# Patient Record
Sex: Male | Born: 1935
Health system: Southern US, Community
[De-identification: ages and names within clinical notes are randomized; demographics above are authoritative.]

## PROBLEM LIST (undated history)

## (undated) DIAGNOSIS — G20 Parkinson's disease: Secondary | ICD-10-CM

## (undated) DIAGNOSIS — N200 Calculus of kidney: Secondary | ICD-10-CM

## (undated) DIAGNOSIS — C439 Malignant melanoma of skin, unspecified: Secondary | ICD-10-CM

## (undated) DIAGNOSIS — J189 Pneumonia, unspecified organism: Secondary | ICD-10-CM

## (undated) DIAGNOSIS — R42 Dizziness and giddiness: Secondary | ICD-10-CM

## (undated) DIAGNOSIS — I10 Essential (primary) hypertension: Secondary | ICD-10-CM

## (undated) DIAGNOSIS — R55 Syncope and collapse: Secondary | ICD-10-CM

## (undated) DIAGNOSIS — A6920 Lyme disease, unspecified: Secondary | ICD-10-CM

## (undated) DIAGNOSIS — I469 Cardiac arrest, cause unspecified: Secondary | ICD-10-CM

## (undated) HISTORY — DX: Essential (primary) hypertension: I10

## (undated) HISTORY — DX: Malignant melanoma of skin, unspecified: C43.9

## (undated) HISTORY — PX: TEMPORAL ARTERY BIOPSY / LIGATION: SUR132

## (undated) HISTORY — PX: CYSTOSCOPY W/ STONE MANIPULATION: SHX1427

---

## 1968-07-23 HISTORY — PX: CATARACT EXTRACTION W/ INTRAOCULAR LENS  IMPLANT, BILATERAL: SHX1307

## 1969-03-23 DIAGNOSIS — I469 Cardiac arrest, cause unspecified: Secondary | ICD-10-CM

## 1969-03-23 HISTORY — DX: Cardiac arrest, cause unspecified: I46.9

## 2008-07-23 DIAGNOSIS — C439 Malignant melanoma of skin, unspecified: Secondary | ICD-10-CM

## 2008-07-23 HISTORY — PX: MELANOMA EXCISION: SHX5266

## 2008-07-23 HISTORY — DX: Malignant melanoma of skin, unspecified: C43.9

## 2012-11-21 ENCOUNTER — Ambulatory Visit: Payer: Medicare HMO | Attending: Internal Medicine

## 2012-11-21 DIAGNOSIS — G20A1 Parkinson's disease without dyskinesia, without mention of fluctuations: Secondary | ICD-10-CM | POA: Insufficient documentation

## 2012-11-21 DIAGNOSIS — G20 Parkinson's disease: Secondary | ICD-10-CM | POA: Insufficient documentation

## 2012-11-21 DIAGNOSIS — R471 Dysarthria and anarthria: Secondary | ICD-10-CM | POA: Insufficient documentation

## 2012-11-21 DIAGNOSIS — IMO0001 Reserved for inherently not codable concepts without codable children: Secondary | ICD-10-CM | POA: Insufficient documentation

## 2012-11-21 DIAGNOSIS — R499 Unspecified voice and resonance disorder: Secondary | ICD-10-CM | POA: Insufficient documentation

## 2013-07-23 HISTORY — PX: BLADDER STONE REMOVAL: SHX568

## 2013-07-23 HISTORY — PX: CIRCUMCISION: SUR203

## 2013-11-20 ENCOUNTER — Ambulatory Visit (INDEPENDENT_AMBULATORY_CARE_PROVIDER_SITE_OTHER): Payer: Medicare HMO | Admitting: Diagnostic Neuroimaging

## 2013-11-20 ENCOUNTER — Encounter: Payer: Self-pay | Admitting: Diagnostic Neuroimaging

## 2013-11-20 ENCOUNTER — Encounter (INDEPENDENT_AMBULATORY_CARE_PROVIDER_SITE_OTHER): Payer: Self-pay

## 2013-11-20 VITALS — BP 90/53 | HR 53 | Temp 96.6°F | Ht 71.0 in | Wt 186.0 lb

## 2013-11-20 DIAGNOSIS — G20 Parkinson's disease: Secondary | ICD-10-CM | POA: Insufficient documentation

## 2013-11-20 DIAGNOSIS — R1310 Dysphagia, unspecified: Secondary | ICD-10-CM

## 2013-11-20 DIAGNOSIS — G252 Other specified forms of tremor: Secondary | ICD-10-CM

## 2013-11-20 DIAGNOSIS — G250 Essential tremor: Secondary | ICD-10-CM

## 2013-11-20 DIAGNOSIS — R490 Dysphonia: Secondary | ICD-10-CM

## 2013-11-20 DIAGNOSIS — R269 Unspecified abnormalities of gait and mobility: Secondary | ICD-10-CM | POA: Insufficient documentation

## 2013-11-20 MED ORDER — CARBIDOPA-LEVODOPA 25-100 MG PO TABS: 2.0000 | ORAL_TABLET | Freq: Three times a day (TID) | ORAL | Status: DC

## 2013-11-20 NOTE — Patient Instructions (Signed)
Go back to original carbidopa/levodopa dosing (2 tabs three times per day) --> 7am, 1pm, 9pm.

## 2013-11-20 NOTE — Progress Notes (Signed)
[facility]  PATIENT: Gabriel Stewart DOB: [birth year]  REFERRING CLINICIAN: Edwin Dada HISTORY FROM: patient and wife REASON FOR VISIT: new consult   HISTORICAL  CHIEF COMPLAINT:  Chief Complaint  Patient presents with  . New Evaluation    rm 7,[place],parkinson's disease, paper referral    HISTORY OF PRESENT ILLNESS:   80 year old ambidextrous male with hypertension, melanoma skin cancer, here for evaluation of Parkinson's disease.  Patient has had essential tremor since 1970s, 1980s, with family history of essential tremor in his own father. Patient's essential tremor was mainly postural tremor, mild action tremor, but did not affect him throughout his life significantly. He was never on medication for essential tremor.  2008 patient developed a new kind of tremor in his left upper extremity, at rest and with action. Handwriting deteriorated. Patient was evaluated and diagnosed with Parkinson's disease. 2 started on carbidopa/levodopa with improvement in tremor control. She's been managed at [facility] for the past year after moving to [place]. Now wants to establish with local neurologist. Patient has been on carbidopa/levodopa 25/100, 2 tablets 3 times per day. Patient followed up with primary care physician, and possibly due to miscommunication, started taking 2 tablets 4 times a day several days ago. No benefit or adverse effects. Patient was doing quite well on 2 tablets 3 times a day. No on a fluctuations. No wearing off.  Patient having some balance difficulty shuffling gait. No falls recently. She's having difficulty swallowing. He has some saliva control issues. Hoarse and soft voice is notable. He has decreased sense of smell and taste. No REM behavior sleep disorder symptoms. He has mild to moderate constipation.  REVIEW OF SYSTEMS: Full 14 system review of systems performed and notable only for memory loss slurred speech difficulty  swallowing tremor decreased energy running nose constipation trouble swallowing melanoma.  ALLERGIES: Allergies  Allergen Reactions  . Novocain [Procaine]     syncope    HOME MEDICATIONS: No outpatient prescriptions prior to visit.   No facility-administered medications prior to visit.    PAST MEDICAL HISTORY: Past Medical History  Diagnosis Date  . Hypertension   . Melanoma 2010    skin    PAST SURGICAL HISTORY: Past Surgical History  Procedure Laterality Date  . Cataract extraction Bilateral 1970  . Circumcision  2015  . Bladder stone removal  2015    FAMILY HISTORY: Family History  Problem Relation Age of Onset  . Cancer - Prostate Sister   . Appendicitis Brother     SOCIAL HISTORY:  History   Social History  . Marital Status: Married    Spouse Name: [name]    Number of Children: 3  . Years of Education: 16   Occupational History  .      retired   Social History Main Topics  . Smoking status: Former Research scientist (life sciences)  . Smokeless tobacco: Never Used     Comment: quit in 1960  . Alcohol Use: Yes     Comment: 2 shots daily  . Drug Use: No  . Sexual Activity: Not on file   Other Topics Concern  . Not on file   Social History Narrative   Patient resides with wife, can write with hands     PHYSICAL EXAM  Filed Vitals:   11/20/13 1046  BP: 90/53  Pulse: 53  Temp: 96.6 F (35.9 C)  TempSrc: Oral  Height: 5\' 11"  (1.803 m)  Weight: 186 lb (84.369 kg)    Not recorded  Body mass index is 25.95 kg/(m^2).  GENERAL EXAM: Patient is in no distress; well developed, nourished and groomed; neck is supple; MULTIPLE STIGMATA OF SKIN CANCERS ON FACE AND SCALP  CARDIOVASCULAR: Regular rate and rhythm, no murmurs, no carotid bruits  NEUROLOGIC: MENTAL STATUS: awake, alert, oriented to person, place and time, recent and remote memory intact, normal attention and concentration, language fluent, comprehension intact, naming intact, fund of knowledge  appropriate CRANIAL NERVE: no papilledema on fundoscopic exam, pupils equal and reactive to light, visual fields full to confrontation, extraocular muscles intact, no nystagmus, facial sensation and strength symmetric, hearing intact, palate elevates symmetrically, uvula midline, shoulder shrug symmetric, tongue midline. HOARSE VOICE. SOFT VOICE. MOTOR: normal bulk and tone, MINIMAL POSTURAL TREMOR. NO RIGIDITY. MILD BRADYKINESIA IN LUE AND LLE. Full strength in the BUE, BLE SENSORY: normal and symmetric to light touch; DECR VIB AT TOES (<5SEC). COORDINATION: finger-nose-finger, fine finger movements normal REFLEXES: BUE TRACE, KNEES TRACE, ANKLES 0 GAIT/STATION: SLOW TO RISE, MILD FREEZING ON GAIT INITIATION, EN BLOC TURNING.     DIAGNOSTIC DATA (LABS, IMAGING, TESTING) - I reviewed patient records, labs, notes, testing and imaging myself where available.  No results found for this basename: WBC, HGB, HCT, MCV, PLT   No results found for this basename: na, k, cl, co2, glucose, bun, creatinine, calcium, prot, albumin, ast, alt, alkphos, bilitot, gfrnonaa, gfraa   No results found for this basename: CHOL, HDL, LDLCALC, LDLDIRECT, TRIG, CHOLHDL   No results found for this basename: HGBA1C   No results found for this basename: VITAMINB12   No results found for this basename: TSH      ASSESSMENT AND PLAN  80 y.o. year old male here with long history of essential tremor, with new-onset Parkinson's disease around 2008. Doing well on carbidopa/levodopa.  Dx: parkinson's disease + essential tremor  PLAN: - resume carb/levo 25/100, 2 tabs TID (inadvertantly has been taking 2 tabs QID due to miscommunication) - PT evaluation for gait training  Orders Placed This Encounter  Procedures  . Ambulatory referral to Physical Therapy   Return in about 6 months (around 05/23/2014).    Penni Bombard, MD 10/27/5679, 27:51 PM Certified in Neurology, Neurophysiology and  [facility] [address], [place], [place] [address] [phone]

## 2013-12-08 ENCOUNTER — Ambulatory Visit: Payer: Medicare HMO | Attending: Diagnostic Neuroimaging | Admitting: Physical Therapy

## 2014-05-24 ENCOUNTER — Ambulatory Visit: Payer: Medicare HMO | Admitting: Diagnostic Neuroimaging

## 2014-05-25 ENCOUNTER — Encounter: Payer: Self-pay | Admitting: Physical Therapy

## 2014-05-25 ENCOUNTER — Ambulatory Visit: Payer: Medicare PPO | Attending: Family Medicine | Admitting: Physical Therapy

## 2014-05-25 DIAGNOSIS — G20 Parkinson's disease: Secondary | ICD-10-CM | POA: Diagnosis not present

## 2014-05-25 DIAGNOSIS — Z5189 Encounter for other specified aftercare: Secondary | ICD-10-CM | POA: Diagnosis not present

## 2014-05-25 DIAGNOSIS — R269 Unspecified abnormalities of gait and mobility: Secondary | ICD-10-CM

## 2014-05-25 NOTE — Therapy (Signed)
Physical Therapy Evaluation  Patient Details  Name: Anfernee Peschke MRN: [id number] Date of Birth: Jan 30, 1922  Encounter Date: 05/25/2014      PT End of Session - 05/25/14 1225    Visit Number 1   Number of Visits 5   Date for PT Re-Evaluation 07/24/14   PT Start Time 1104   PT Stop Time 1146   PT Time Calculation (min) 42 min      Past Medical History  Diagnosis Date  . Hypertension   . Melanoma 2010    skin    Past Surgical History  Procedure Laterality Date  . Cataract extraction Bilateral 1970  . Circumcision  2015  . Bladder stone removal  2015    There were no vitals taken for this visit.  Visit Diagnosis:  Abnormality of gait - Plan: PT plan of care cert/re-cert      Subjective Assessment - 05/25/14 1109    Symptoms Pt is a 80 year old male who presents to OP PTwith increased difficulty with walking and recent fall.  Pt had a recent fall in August, "stubbing" toe on sidewalk, falling on his face.  He uses cane with gait.   Pertinent History Parkinson's disease for at least 6-8 years, trouble swallowing and talking (reports having previous speech therapy evaluation in [place]-no recommendations for speech)   Patient Stated Goals Pt's goal for therapy is to be more relaxed and stable in walking.  Wife would like specific exercises for Parkinson's disease   Currently in Pain? No/denies          [facility] PT Assessment - 05/25/14 1100    Medical Diagnosis Gait Instability; Parkinson's disease   Onset Date --  Parkinson's disease 6-8 years ago; fall August 2015   Precautions Fall   Has the patient fallen in the past 6 months Yes   How many times? 1   Has the patient had a decrease in activity level because of a fear of falling?  No   Is the patient reluctant to leave their home because of a fear of falling?  No   Family/patient expects to be discharged to: Private residence   Living Arrangements Spouse/significant other   Available Help at Discharge Family    Type of [place] Access Level entry   [place] - single point   Additional Comments Pt and wife also have home in [place], where they spend summers.  Home has steps to enter and steps inside for multiple levels.   Posture/Postural Control Postural limitations   Postural Limitations Forward head, rounded shoulders   Overall Strength Other (comment)  bilateral low extremities grossly tested 4/5   Transfers Sit to Stand;Stand to Sit   Sit to Stand 6: Modified independent (Device/Increase time);With upper extremity assist  5x sit<>stand:  12.91 sec with flexed knees in standing   Stand to Sit 6: Modified independent (Device/Increase time);With upper extremity assist   Ambulation/Gait Yes   Ambulation/Gait Assistance 5: Supervision   Ambulation/Gait Assistance Details Pt uses cane, but switches cane from hand to hand during gait.   Ambulation Distance (Feet) 200 Feet   Assistive device Straight cane   Gait Pattern Decreased dorsiflexion - right;Decreased dorsiflexion - left;Festinating;Trendelenburg;Decreased trunk rotation  decreased foot clearance, stiff gait pattern   Gait velocity 10.82 sec = 3.03 ft/sec  backwards 5.87 sec (1.72 ft/sec)   Stairs Yes   Stairs Assistance 6: Modified independent (Device/Increase time)   Stair Management  Technique One rail Left;With cane   Number of Stairs 4   Standardized Balance Assessment Timed Up and Go Test   Normal TUG (seconds) 13.01   Manual TUG (seconds) 11.09   Cognitive TUG (seconds) 12.7   TUG Comments decreased foot clearance with turns, uncontrolled descent into sitting; pt switches hands for cane during TUG   Gait Level Surface Walks 20 ft, slow speed, abnormal gait pattern, evidence for imbalance or deviates 10-15 in outside of the 12 in walkway width. Requires more than 7 sec to ambulate 20 ft.   Change in Gait Speed Able to change speed, demonstrates mild gait deviations, deviates 6-10 in  outside of the 12 in walkway width, or no gait deviations, unable to achieve a major change in velocity, or uses a change in velocity, or uses an assistive device.   Gait with Horizontal Head Turns Performs head turns with moderate changes in gait velocity, slows down, deviates 10-15 in outside 12 in walkway width but recovers, can continue to walk.   Gait with Vertical Head Turns Performs task with moderate change in gait velocity, slows down, deviates 10-15 in outside 12 in walkway width but recovers, can continue to walk.   Gait and Pivot Turn Pivot turns safely in greater than 3 sec and stops with no loss of balance, or pivot turns safely within 3 sec and stops with mild imbalance, requires small steps to catch balance.  with cane   Step Over Obstacle Is able to step over one shoe box (4.5 in total height) but must slow down and adjust steps to clear box safely. May require verbal cueing.   Gait with Narrow Base of Support Ambulates less than 4 steps heel to toe or cannot perform without assistance.   Gait with Eyes Closed Walks 20 ft, slow speed, abnormal gait pattern, evidence for imbalance, deviates 10-15 in outside 12 in walkway width. Requires more than 9 sec to ambulate 20 ft.   Ambulating Backwards Walks 20 ft, slow speed, abnormal gait pattern, evidence for imbalance, deviates 10-15 in outside 12 in walkway width.   Steps Alternating feet, must use rail.   Total Score 12                PT Long Term Goals - 05/25/14 1338    Title Pt will be able to perform HEP for Parkinson's specific exercises with wife's supervision.   Time 4   Period Weeks   Status New   Title improve Functional Gait Assessment to at least 17/24 for decreased fall risk.   Time 4   Period Weeks   Status New   Title ambulate at least 500 ft. using single point cane, with superivision, for improved ambulation efficiency and safety.   Time 4   Period Weeks   Status New   Title verbalize plans for  continued community fitness upon D/C from PT.   Time 4   Period Weeks   Status New          Plan - 05/25/14 1227    Clinical Impression Statement Pt is a 80 year old male who presents to OP PT with desires to have Parkinson's specific exercise program.  Pt has had one fall in the past 6 months, after which he began using a cane.  He is noticing difficulty with swalllowing and speech; would like to be more steady with gait.   Pt will benefit from skilled therapeutic intervention in order to improve on the following deficits Abnormal  gait;Decreased balance  decreased timing and coordination with gait; festination with gait   Rehab Potential Good   PT Frequency Min 1X/week  PT recommends 2x/wk for 4 weeks; pt's wife concerned about being able to make that many appointments;    PT Duration 4 weeks   PT Treatment/Interventions Gait training;Functional mobility training;Patient/family education;Neuromuscular re-education;Balance training;Therapeutic exercise   PT Plan Initiate sitting and standing PWR! Moves exercises; PWR! sit<>stand; gait activities as HEP          G-Codes - Jun 06, 2014 1345    Functional Assessment Tool Used Functional Gait Assessment   Functional Limitation Mobility: Walking and moving around   Mobility: Walking and Moving Around Current Status [phone]) At least 40 percent but less than 60 percent impaired, limited or restricted   Mobility: Walking and Moving Around Goal Status [phone]) At least 20 percent but less than 40 percent impaired, limited or restricted      Problem List Patient Active Problem List   Diagnosis Date Noted  . Parkinson's disease 11/20/2013  . Essential tremor 11/20/2013  . Gait difficulty 11/20/2013  . Swallowing difficulty 11/20/2013  . Hoarse voice quality 11/20/2013                                            Farran Amsden W. June 06, 2014, 1:54 PM

## 2014-06-14 ENCOUNTER — Ambulatory Visit: Payer: Medicare HMO | Admitting: Diagnostic Neuroimaging

## 2014-06-15 ENCOUNTER — Ambulatory Visit: Payer: Medicare PPO | Admitting: Physical Therapy

## 2014-06-21 ENCOUNTER — Encounter: Payer: Self-pay | Admitting: Diagnostic Neuroimaging

## 2014-06-22 ENCOUNTER — Ambulatory Visit: Payer: Medicare PPO | Admitting: Physical Therapy

## 2014-06-29 ENCOUNTER — Ambulatory Visit (INDEPENDENT_AMBULATORY_CARE_PROVIDER_SITE_OTHER): Payer: Medicare HMO | Admitting: Diagnostic Neuroimaging

## 2014-06-29 ENCOUNTER — Encounter: Payer: Self-pay | Admitting: Diagnostic Neuroimaging

## 2014-06-29 VITALS — BP 149/71 | HR 70 | Temp 96.8°F | Ht 72.0 in | Wt 179.8 lb

## 2014-06-29 DIAGNOSIS — G20 Parkinson's disease: Secondary | ICD-10-CM

## 2014-06-29 MED ORDER — CARBIDOPA-LEVODOPA 25-100 MG PO TABS: 2.0000 | ORAL_TABLET | Freq: Three times a day (TID) | ORAL | Status: DC

## 2014-06-29 NOTE — Patient Instructions (Signed)
Continue carb/levo.  Return to physical therapy.

## 2014-06-29 NOTE — Progress Notes (Signed)
[facility]  PATIENT: Gabriel Stewart DOB: 11-07-1921  REFERRING CLINICIAN: Edwin Dada HISTORY FROM: patient and wife REASON FOR VISIT: follow up   HISTORICAL  CHIEF COMPLAINT:  Chief Complaint  Patient presents with  . Follow-up    PD    HISTORY OF PRESENT ILLNESS:   UPDATE 06/29/14: Since last visit, fell once in [place], and in general having more confusion and memory problems. Having more prob with using the computer. Using a cane now. Trying to get back to PT. Some swallow diff.   PRIOR HPI (11/20/13): 80 year old ambidextrous male with hypertension, melanoma skin cancer, here for evaluation of Parkinson's disease. Patient has had essential tremor since 1970s, 1980s, with family history of essential tremor in his own father. Patient's essential tremor was mainly postural tremor, mild action tremor, but did not affect him throughout his life significantly. He was never on medication for essential tremor. 2008 patient developed a new kind of tremor in his left upper extremity, at rest and with action. Handwriting deteriorated. Patient was evaluated and diagnosed with Parkinson's disease. 80 started on carbidopa/levodopa with improvement in tremor control. 80 been managed at [facility] for the past year after moving to [place]. Now wants to establish with local neurologist. Patient has been on carbidopa/levodopa 25/100, 2 tablets 3 times per day. Patient followed up with primary care physician, and possibly due to miscommunication, started taking 2 tablets 4 times a day several days ago. No benefit or adverse effects. Patient was doing quite well on 2 tablets 3 times a day. No on a fluctuations. No wearing off. Patient having some balance difficulty shuffling gait. No falls recently. 80 having difficulty swallowing. He has some saliva control issues. Hoarse and soft voice is notable. He has decreased sense of smell and taste. No REM behavior sleep  disorder symptoms. He has mild to moderate constipation.  REVIEW OF SYSTEMS: Full 14 system review of systems performed and notable only for memory loss slurred speech difficulty swallowing tremor decreased energy running nose constipation trouble swallowing melanoma weight loss confusion choking.    ALLERGIES: Allergies  Allergen Reactions  . Novocain [Procaine]     syncope    HOME MEDICATIONS: Outpatient Prescriptions Prior to Visit  Medication Sig Dispense Refill  . carbidopa-levodopa (SINEMET IR) 25-100 MG per tablet Take 2 tablets by mouth 3 (three) times daily. 180 tablet 12  . Dutasteride-Tamsulosin HCl 0.5-0.4 MG CAPS Take by mouth. Once daily    . vitamin B-12 (CYANOCOBALAMIN) 1000 MCG tablet Take 1,000 mcg by mouth daily.    Marland Kitchen lisinopril (PRINIVIL,ZESTRIL) 10 MG tablet Take 10 mg by mouth daily.     No facility-administered medications prior to visit.    PAST MEDICAL HISTORY: Past Medical History  Diagnosis Date  . Hypertension   . Melanoma 2010    skin    PAST SURGICAL HISTORY: Past Surgical History  Procedure Laterality Date  . Cataract extraction Bilateral 1970  . Circumcision  2015  . Bladder stone removal  2015    FAMILY HISTORY: Family History  Problem Relation Age of Onset  . Cancer - Prostate Sister   . Appendicitis Brother     SOCIAL HISTORY:  History   Social History  . Marital Status: Married    Spouse Name: [name]    Number of Children: 3  . Years of Education: 16   Occupational History  .      retired   Social History Main Topics  . Smoking status: Former Research scientist (life sciences)  .  Smokeless tobacco: Never Used     Comment: quit in 1960  . Alcohol Use: Yes     Comment: 2 shots daily  . Drug Use: No  . Sexual Activity: Not on file   Other Topics Concern  . Not on file   Social History Narrative   Patient resides with wife, can write with hands     PHYSICAL EXAM  Filed Vitals:   06/29/14 1411  BP: 149/71  Pulse: 70  Temp: 96.8 F (36  C)  TempSrc: Oral  Height: 6' (1.829 m)  Weight: 179 lb 12.8 oz (81.557 kg)    Not recorded      Body mass index is 24.38 kg/(m^2).  GENERAL EXAM: Patient is in no distress; well developed, nourished and groomed; neck is supple; MULTIPLE STIGMATA OF SKIN CANCERS ON FACE AND SCALP  CARDIOVASCULAR: Regular rate and rhythm, no murmurs, no carotid bruits  NEUROLOGIC: MENTAL STATUS: awake, alert, oriented to person, place and time, recent and remote memory intact, normal attention and concentration, language fluent, comprehension intact, naming intact, fund of knowledge appropriate CRANIAL NERVE: no papilledema on fundoscopic exam, pupils equal and reactive to light, visual fields full to confrontation, extraocular muscles intact, no nystagmus, facial sensation and strength symmetric, hearing intact, palate elevates symmetrically, uvula midline, shoulder shrug symmetric, tongue midline. HOARSE VOICE. SOFT VOICE. MOTOR: normal bulk and tone, MINIMAL POSTURAL TREMOR. NO RIGIDITY. MILD BRADYKINESIA IN LUE AND LLE. Full strength in the BUE, BLE SENSORY: normal and symmetric to light touch; DECR VIB AT TOES (<5SEC). COORDINATION: finger-nose-finger, fine finger movements normal REFLEXES: BUE TRACE, KNEES TRACE, ANKLES 0 GAIT/STATION: SLOW TO RISE, SHORT STEPS, SHUFFLING GAIT, MILD FREEZING ON GAIT INITIATION, EN BLOC TURNING.     DIAGNOSTIC DATA (LABS, IMAGING, TESTING) - I reviewed patient records, labs, notes, testing and imaging myself where available.  No results found for: WBC No results found for: NA No results found for: CHOL No results found for: HGBA1C No results found for: VITAMINB12 No results found for: TSH    ASSESSMENT AND PLAN  80 y.o. year old male here with long history of essential tremor, with new-onset Parkinson's disease around 2008. On carbidopa/levodopa, with progression of confusion and gait difficulty.   Dx: parkinson's disease (akinetic/rigid) + essential  tremor  PLAN: - CONTINUE carb/levo (25/100) 2 tabs TID  - PT evaluation for gait training  Return in about 6 months (around 12/29/2014).    Penni Bombard, MD 78/07/6551, 7:48 PM Certified in Neurology, Neurophysiology and Neuroimaging  [facility] [address], [place], [place] [address] [phone]

## 2014-07-05 ENCOUNTER — Encounter: Payer: Self-pay | Admitting: Physical Therapy

## 2014-07-05 ENCOUNTER — Ambulatory Visit: Payer: Medicare PPO | Attending: Family Medicine | Admitting: Physical Therapy

## 2014-07-05 DIAGNOSIS — R269 Unspecified abnormalities of gait and mobility: Secondary | ICD-10-CM | POA: Diagnosis not present

## 2014-07-05 DIAGNOSIS — Z5189 Encounter for other specified aftercare: Secondary | ICD-10-CM | POA: Insufficient documentation

## 2014-07-05 DIAGNOSIS — G20 Parkinson's disease: Secondary | ICD-10-CM | POA: Insufficient documentation

## 2014-07-05 NOTE — Patient Instructions (Signed)
Sit to Stand Transfers:  1. Scoot out to the edge of the chair 2. Place your feet flat on the floor, shoulder width apart.  Make sure your feet are tucked just under your knees. 3. Lean forward (nose over toes) with momentum, and stand up tall with your best posture.  If you need to use your arms, use them as a quick boost up to stand. 4. If you are in a low or soft chair, you can lean back and then forward up to stand, in order to get more momentum. 5. Once you are standing, make sure you are looking ahead and standing tall.  To sit down:  1. Back up until you feel the chair behind your legs. 2. Bend at you hips, reaching  Back for you chair, if needed, then slowly squat to sit down on your chair.   PWR! Moves in Standing:  See additional sheet for PWR! Moves in standing.  Perform each exercise 10-20 reps.

## 2014-07-05 NOTE — Therapy (Signed)
[facility] [address] [place], [place], [address] Phone: [phone]   Fax:  [phone]  Physical Therapy Treatment  Patient Details  Name: Gabriel Stewart MRN: [id number] Date of Birth: 10-04-1921  Encounter Date: 07/05/2014      PT End of Session - 07/05/14 1310    Visit Number 2  G2   Number of Visits 5   Date for PT Re-Evaluation 07/24/14   Authorization Type [employer]   Authorization Time Period through 07/29/14   Authorization - Visit Number 1   Authorization - Number of Visits 6   PT Start Time 1106   PT Stop Time 1145   PT Time Calculation (min) 39 min   Activity Tolerance Patient tolerated treatment well      Past Medical History  Diagnosis Date  . Hypertension   . Melanoma 2010    skin    Past Surgical History  Procedure Laterality Date  . Cataract extraction Bilateral 1970  . Circumcision  2015  . Bladder stone removal  2015    There were no vitals taken for this visit.  Visit Diagnosis:  Abnormality of gait      Subjective Assessment - 07/05/14 1111    Symptoms Pt returns to PT today after awaiting insurance authorization, after pt had cancelled his appointments.  Pt and wife report no changes since eval 05/25/14.  He reports no falls.   Currently in Pain? No/denies            [facility] Adult PT Treatment/Exercise - 07/05/14 1131    Transfers   Transfers Sit to Stand;Stand to Sit   Sit to Stand 6: Modified independent (Device/Increase time);With upper extremity assist;From chair/3-in-1  2 sets x 10 reps from 18 inch height   Stand to Sit 6: Modified independent (Device/Increase time);With upper extremity assist;To chair/3-in-1  Cues for techniques   Ambulation/Gait   Ambulation/Gait Yes   Ambulation/Gait Assistance 5: Supervision   Ambulation/Gait Assistance Details Cues provided for initial weightshifting upon standing; requires cues for proper sequence of cane   Ambulation Distance (Feet) 115  Feet  x 3 reps   Assistive device Straight cane  Cues for cane sequence, incr. step length   Gait Pattern Festinating;Narrow base of support   High Level Balance   High Level Balance Comments Forward step and weightshift x 10 reps, then forward/back wieghtshift 10 reps at counter.          PT Education - 07/05/14 1309    Education provided Yes   Education Details HEP for [employer]! MOves in standing as well as transfer techniques provided   Person(s) Educated Patient   Methods Explanation;Demonstration;Handout   Comprehension Verbalized understanding;Returned demonstration;Verbal cues required            PT Long Term Goals - 07/05/14 1645    PT LONG TERM GOAL #1   Title Pt will be able to perform HEP for Parkinson's specific exercises with wife's supervision.   Status On-going   PT LONG TERM GOAL #2   Title improve Functional Gait Assessment to at least 17/24 for decreased fall risk.   Status On-going   PT LONG TERM GOAL #3   Title ambulate at least 500 ft. using single point cane, with superivision, for improved ambulation efficiency and safety.   Status On-going   PT LONG TERM GOAL #4   Title verbalize plans for continued community fitness upon D/C from PT.   Status On-going  Plan - 07/05/14 1638    Clinical Impression Statement Pt presents to OP PT for first visit since eval (05/25/2014) today due to pt cancelling visits/awaiting insurance authorization.  Initiated HEP today for PWR! Moves, with pt requiring cues for pacing and slow, deliberate movements.  Pt will continue to benefit from further skilled PT to address balance, gait.  Pt's goals remain appropriate.   Pt will benefit from skilled therapeutic intervention in order to improve on the following deficits Abnormal gait;Decreased balance   Rehab Potential Good   PT Frequency --  Min 1x/wk; PT recommends 2x/wk, pt's wife concerned that they may not be able to make that many appts.   PT Duration 4 weeks    PT Treatment/Interventions Gait training;Patient/family education;Functional mobility training;Neuromuscular re-education;Balance training;Therapeutic exercise   PT Next Visit Plan Review HEP; work on pacing with gait, weightshifting activities                      [facility]) - 07/05/14 1119    PWR! exercises Moves in sitting;Moves in standing   PWR! Up 10   PWR! Rock 10   PWR! Twist 10   PWR Step 10   Comments at chair with intermittent UE support and cues for weightshifting and deliberate effort with large amplitude movements   PWR! Up 10   PWR! Rock 10   PWR! Twist 10   PWR! Step 10       PWR! Moves initiated to address large amplitude, deliberate movement patterns with functional mobility.            Problem List Patient Active Problem List   Diagnosis Date Noted  . Parkinson's disease 11/20/2013  . Essential tremor 11/20/2013  . Gait difficulty 11/20/2013  . Swallowing difficulty 11/20/2013  . Hoarse voice quality 11/20/2013    MARRIOTT,AMY W. 07/05/2014, 4:49 PM  Mady Haagensen, PT 07/05/2014 4:50 PM Phone: [phone] Fax: [phone]

## 2014-07-13 ENCOUNTER — Ambulatory Visit: Payer: Medicare PPO | Admitting: Physical Therapy

## 2014-07-13 ENCOUNTER — Encounter: Payer: Self-pay | Admitting: Physical Therapy

## 2014-07-13 DIAGNOSIS — Z5189 Encounter for other specified aftercare: Secondary | ICD-10-CM | POA: Diagnosis not present

## 2014-07-13 DIAGNOSIS — R269 Unspecified abnormalities of gait and mobility: Secondary | ICD-10-CM

## 2014-07-13 NOTE — Therapy (Signed)
[facility] [address] [place], [place], [address] Phone: [phone]   Fax:  [phone]  Physical Therapy Treatment  Patient Details  Name: Gabriel Stewart MRN: [id number] Date of Birth: November 16, 1921  Encounter Date: 07/13/2014      PT End of Session - 07/13/14 1425    Visit Number 3   Number of Visits 5   Date for PT Re-Evaluation 07/24/14   Authorization Type [employer]   Authorization Time Period through 07/29/14   Authorization - Visit Number 2   Authorization - Number of Visits 6   PT Start Time 1316   PT Stop Time 1402   PT Time Calculation (min) 46 min   Activity Tolerance Patient tolerated treatment well   Behavior During Therapy [facility] for tasks assessed/performed      Past Medical History  Diagnosis Date  . Hypertension   . Melanoma 2010    skin    Past Surgical History  Procedure Laterality Date  . Cataract extraction Bilateral 1970  . Circumcision  2015  . Bladder stone removal  2015    There were no vitals taken for this visit.  Visit Diagnosis:  Abnormality of gait      Subjective Assessment - 07/13/14 1416    Symptoms Pt states he works out 6 days a week at [employer], treadmill, bike and has added HEP from here.   Currently in Pain? No/denies                    [facility] Adult PT Treatment/Exercise - 07/13/14 1419    Transfers   Transfers Sit to Stand;Stand to Sit   Sit to Stand 6: Modified independent (Device/Increase time);With upper extremity assist;From chair/3-in-1   Stand to Sit 6: Modified independent (Device/Increase time);With upper extremity assist;To chair/3-in-1   Ambulation/Gait   Ambulation/Gait Yes   Ambulation/Gait Assistance 5: Supervision   Ambulation/Gait Assistance Details cues for step length and cadence.  Tends to carry cane.  Performed cognitive tasks and gait speed decreases as well as balance.   Ambulation Distance (Feet) 500 Feet  twice   Assistive device Straight cane   Gait Pattern Festinating;Narrow base of support   High Level Balance   High Level Balance Comments Forward step and backward weightshift x 15 reps, then forward/back wieghtshift 15 reps at counter.   Knee/Hip Exercises: Aerobic   Tread Mill treadmill x 4 minutes at 2.6 mph with cues for step length, posture and softer/controlled foot placement.  Performed cognitive tasks while on treadmill.           [facility]) - 07/13/14 1421    PWR! Up 20   PWR! Rock 10   PWR! Twist 10   PWR Step 10   Comments intermittent support of chair and cues for intensity and large amplitude   PWR! Up 20   PWR! Rock 10   PWR! Twist 10   PWR! Step 10   Comments cues for intensity and large amplitude                  PT Long Term Goals - 07/05/14 1645    PT LONG TERM GOAL #1   Title Pt will be able to perform HEP for Parkinson's specific exercises with wife's supervision.   Status On-going   PT LONG TERM GOAL #2   Title improve Functional Gait Assessment to at least 17/24 for decreased fall risk.   Status On-going   PT LONG TERM GOAL #  3   Title ambulate at least 500 ft. using single point cane, with superivision, for improved ambulation efficiency and safety.   Status On-going   PT LONG TERM GOAL #4   Title verbalize plans for continued community fitness upon D/C from PT.   Status On-going               Plan - 07/13/14 1429    Clinical Impression Statement Pt states he has been doing exercises at home.  Needs cues for intensity and cognitive tasks impact patients gait.   Pt will benefit from skilled therapeutic intervention in order to improve on the following deficits Abnormal gait;Decreased balance   Rehab Potential Good   PT Frequency 2x / week  wife unsure she can do 2x/week   PT Duration 4 weeks   PT Treatment/Interventions Gait training;Patient/family education;Functional mobility training;Neuromuscular re-education;Balance  training;Therapeutic exercise   PT Next Visit Plan Gait with cognitive tasks.  Pacing gait.   Consulted and Agree with Plan of Care Patient        Problem List Patient Active Problem List   Diagnosis Date Noted  . Parkinson's disease 11/20/2013  . Essential tremor 11/20/2013  . Gait difficulty 11/20/2013  . Swallowing difficulty 11/20/2013  . Hoarse voice quality 11/20/2013    Narda Bonds 07/13/2014, 2:32 PM  [facility] [address] [place], [place], [address] Phone: [phone]   Fax:  [place], [place] 07/13/2014 2:32 PM Phone: [phone] Fax: [phone]

## 2014-07-14 ENCOUNTER — Ambulatory Visit: Payer: Medicare PPO | Admitting: Physical Therapy

## 2014-07-20 ENCOUNTER — Ambulatory Visit: Payer: Medicare PPO | Admitting: Physical Therapy

## 2014-07-20 ENCOUNTER — Encounter: Payer: Self-pay | Admitting: Physical Therapy

## 2014-07-20 DIAGNOSIS — Z5189 Encounter for other specified aftercare: Secondary | ICD-10-CM | POA: Diagnosis not present

## 2014-07-20 DIAGNOSIS — R269 Unspecified abnormalities of gait and mobility: Secondary | ICD-10-CM

## 2014-07-20 NOTE — Therapy (Signed)
[facility] [address] [place], [place], [address] Phone: [phone]   Fax:  [phone]  Physical Therapy Treatment  Patient Details  Name: Gabriel Stewart MRN: [id number] Date of Birth: August 17, 1921  Encounter Date: 07/20/2014      PT End of Session - 07/20/14 1610    Visit Number 4   Number of Visits 5   Date for PT Re-Evaluation 07/24/14   Authorization Type [employer]   Authorization Time Period through 07/29/14   Authorization - Visit Number 5   Authorization - Number of Visits 6   PT Start Time 1450   PT Stop Time 1530   PT Time Calculation (min) 40 min   Equipment Utilized During Treatment Gait belt   Activity Tolerance Patient tolerated treatment well   Behavior During Therapy [facility] for tasks assessed/performed      Past Medical History  Diagnosis Date  . Hypertension   . Melanoma 2010    skin    Past Surgical History  Procedure Laterality Date  . Cataract extraction Bilateral 1970  . Circumcision  2015  . Bladder stone removal  2015    There were no vitals taken for this visit.  Visit Diagnosis:  Abnormality of gait          [facility] Adult PT Treatment/Exercise - 07/20/14 1455    Transfers   Sit to Stand 6: Modified independent (Device/Increase time)   Stand to Sit 6: Modified independent (Device/Increase time)   Ambulation/Gait   Ambulation/Gait Yes   Ambulation/Gait Assistance 5: Supervision;4: Min guard   Ambulation/Gait Assistance Details cues to maintain step length and soft landings while engaged in cognitive tasks. Less cues needed when pt was engaged in a cognitive task related to personal interest vs a random one given by therapist.      Ambulation Distance (Feet) 420 Feet  x1, 673ft x1, 450 ft x1   Assistive device Straight cane   Gait Pattern Festinating;Narrow base of support  when performing cognitive tasks, otherwise [facility]   High Level Balance   High Level Balance Comments  Forward then backward steps with UE raises during forward portion, with emphasis on weight shifting x 10 each side alternating sides. Lateral stepping out/in with simultaneous shoulder horizontal abduction x10 each side, alternating sides.                              Knee/Hip Exercises: Aerobic   Stationary Bike Scifit x 4 extremeties Level 2.5 x 8 minutes with goal RPM >/= 65             PT Long Term Goals - 07/05/14 1645    PT LONG TERM GOAL #1   Title Pt will be able to perform HEP for Parkinson's specific exercises with wife's supervision.   Status On-going   PT LONG TERM GOAL #2   Title improve Functional Gait Assessment to at least 17/24 for decreased fall risk.   Status On-going   PT LONG TERM GOAL #3   Title ambulate at least 500 ft. using single point cane, with superivision, for improved ambulation efficiency and safety.   Status On-going   PT LONG TERM GOAL #4   Title verbalize plans for continued community fitness upon D/C from PT.   Status On-going           Plan - 07/20/14 1612    Clinical Impression Statement Pt making steady progress toward goals.  Pt will benefit from skilled therapeutic intervention in order to improve on the following deficits Abnormal gait;Decreased balance   Rehab Potential Good   PT Frequency 2x / week  wife unsure she can do 2x/week   PT Duration 4 weeks   PT Treatment/Interventions Gait training;Patient/family education;Functional mobility training;Neuromuscular re-education;Balance training;Therapeutic exercise   PT Next Visit Plan Assess LTG's   Consulted and Agree with Plan of Care Patient        Problem List Patient Active Problem List   Diagnosis Date Noted  . Parkinson's disease 11/20/2013  . Essential tremor 11/20/2013  . Gait difficulty 11/20/2013  . Swallowing difficulty 11/20/2013  . Hoarse voice quality 11/20/2013    Willow Ora 07/20/2014, 4:14 PM  Willow Ora, PTA, [facility] [address], [place], [place] [address] [phone] 07/20/2014, 4:14 PM

## 2014-07-27 ENCOUNTER — Ambulatory Visit: Payer: Medicare PPO | Admitting: Physical Therapy

## 2014-07-28 ENCOUNTER — Ambulatory Visit: Payer: Medicare PPO | Attending: Family Medicine | Admitting: Physical Therapy

## 2014-07-28 DIAGNOSIS — Z5189 Encounter for other specified aftercare: Secondary | ICD-10-CM | POA: Diagnosis present

## 2014-07-28 DIAGNOSIS — R269 Unspecified abnormalities of gait and mobility: Secondary | ICD-10-CM | POA: Diagnosis not present

## 2014-07-28 DIAGNOSIS — G20 Parkinson's disease: Secondary | ICD-10-CM | POA: Insufficient documentation

## 2014-07-28 NOTE — Patient Instructions (Signed)
Backward   Walk backwards with eyes open. Take even steps, making sure each foot lifts off floor. Repeat for ____ minutes per session. Do ____ sessions per day. Repeat on compliant surface: ________.  Copyright  VHI. All rights reserved.  Side-Stepping   Walk to left side with eyes open. Take even steps, leading with same foot. Make sure each foot lifts off the floor. Repeat in opposite direction. Repeat for ____ minutes per session. Do ____ sessions per day. Repeat on compliant surface: ________.  Copyright  VHI. All rights reserved.

## 2014-07-29 DIAGNOSIS — Z5189 Encounter for other specified aftercare: Secondary | ICD-10-CM | POA: Diagnosis not present

## 2014-07-29 NOTE — Therapy (Signed)
[facility] [address] [place], [place], [address] Phone: [phone]   Fax:  [phone]  Physical Therapy Treatment  Patient Details  Name: Gabriel Stewart MRN: [id number] Date of Birth: 1922/03/08 Referring Provider:  Randa Lynn*  Encounter Date: 07/28/2014      PT End of Session - 07/29/14 1729    Visit Number 5   Number of Visits 5   PT Start Time 1150   PT Stop Time 1230   PT Time Calculation (min) 40 min   Activity Tolerance Patient tolerated treatment well      Past Medical History  Diagnosis Date  . Hypertension   . Melanoma 2010    skin    Past Surgical History  Procedure Laterality Date  . Cataract extraction Bilateral 1970  . Circumcision  2015  . Bladder stone removal  2015    There were no vitals taken for this visit.  Visit Diagnosis:  Abnormality of gait      Subjective Assessment - 07/28/14 1155    Symptoms Pt reports doing exercise some; no falls   Currently in Pain? No/denies          [facility] PT Assessment - 07/28/14 1201    Functional Gait  Assessment   Gait assessed  Yes   Gait Level Surface Walks 20 ft in less than 7 sec but greater than 5.5 sec, uses assistive device, slower speed, mild gait deviations, or deviates 6-10 in outside of the 12 in walkway width.   Change in Gait Speed Able to change speed, demonstrates mild gait deviations, deviates 6-10 in outside of the 12 in walkway width, or no gait deviations, unable to achieve a major change in velocity, or uses a change in velocity, or uses an assistive device.   Gait with Horizontal Head Turns Performs head turns smoothly with slight change in gait velocity (eg, minor disruption to smooth gait path), deviates 6-10 in outside 12 in walkway width, or uses an assistive device.   Gait with Vertical Head Turns Performs task with slight change in gait velocity (eg, minor disruption to smooth gait path), deviates 6 -  10 in outside 12 in walkway width or uses assistive device   Gait and Pivot Turn Pivot turns safely within 3 sec and stops quickly with no loss of balance.  cane   Step Over Obstacle Is able to step over one shoe box (4.5 in total height) but must slow down and adjust steps to clear box safely. May require verbal cueing.   Gait with Narrow Base of Support Ambulates less than 4 steps heel to toe or cannot perform without assistance.   Gait with Eyes Closed Walks 20 ft, slow speed, abnormal gait pattern, evidence for imbalance, deviates 10-15 in outside 12 in walkway width. Requires more than 9 sec to ambulate 20 ft.   Ambulating Backwards Walks 20 ft, slow speed, abnormal gait pattern, evidence for imbalance, deviates 10-15 in outside 12 in walkway width.   Steps Alternating feet, must use rail.   Total Score 16      Pt performs standing PWR! MOves HEP without UE support, independently.  Pt does need occasional cues for pacing with HEP.  Pt performs standing balance activities at counter:  Forward, backward gait, side stepping with intermittent UE support, 3 reps x 10 ft.  Pt requires cues for slowed pacing with activity.  (Added to HEP)  PT Education - August 23, 2014 1729    Education Details Updates to HEP, progress with PT, plans for D/C, Parkinson's follow up screens   Person(s) Educated Patient   Methods Explanation;Demonstration;Handout   Comprehension Verbalized understanding;Returned demonstration             PT Long Term Goals - August 23, 2014 1731    PT LONG TERM GOAL #1   Title Pt will be able to perform HEP for Parkinson's specific exercises with wife's supervision.   Status Achieved   PT LONG TERM GOAL #2   Title improve Functional Gait Assessment to at least 17/24 for decreased fall risk.   Status Not Met  Functional Gait assessment 16/30   PT LONG TERM GOAL #3   Title ambulate at least 500 ft. using single point cane, with superivision, for  improved ambulation efficiency and safety.   Status Achieved   PT LONG TERM GOAL #4   Title verbalize plans for continued community fitness upon D/C from PT.   Status Achieved               Plan - Aug 23, 2014 1730    Clinical Impression Statement Pt has met LTG #1, 3, 4.  LTG #2 not met.   Pt will benefit from skilled therapeutic intervention in order to improve on the following deficits Abnormal gait;Decreased balance   Rehab Potential Good   PT Next Visit Plan Discharge PT   Consulted and Agree with Plan of Care Patient          G-Codes - Aug 23, 2014 1732    Functional Assessment Tool Used Functional Gait Assessment 16/30   Functional Limitation Mobility: Walking and moving around   Mobility: Walking and Moving Around Goal Status [phone]) At least 20 percent but less than 40 percent impaired, limited or restricted   Mobility: Walking and Moving Around Discharge Status [phone]) At least 20 percent but less than 40 percent impaired, limited or restricted      Problem List Patient Active Problem List   Diagnosis Date Noted  . Parkinson's disease 11/20/2013  . Essential tremor 11/20/2013  . Gait difficulty 11/20/2013  . Swallowing difficulty 11/20/2013  . Hoarse voice quality 11/20/2013    Zoei Amison W. August 23, 2014, 5:37 PM  Mady Haagensen, PT 23-Aug-2014 5:39 PM Phone: [phone] Fax: [facility] [address] [place], [place], [address] Phone: [phone]   Fax:  [phone]

## 2014-07-29 NOTE — Therapy (Signed)
[facility] [address] [place], [place], [address] Phone: [phone]   Fax:  [phone]  Physical Therapy Treatment  Patient Details  Name: Gabriel Stewart MRN: [id number] Date of Birth: [birth year] Referring Provider:  Randa Lynn*  Encounter Date: 07/28/2014      PT End of Session - 07/29/14 1729    Visit Number 5   Number of Visits 5   PT Start Time 1150   PT Stop Time 1230   PT Time Calculation (min) 40 min   Activity Tolerance Patient tolerated treatment well      Past Medical History  Diagnosis Date  . Hypertension   . Melanoma 2010    skin    Past Surgical History  Procedure Laterality Date  . Cataract extraction Bilateral 1970  . Circumcision  2015  . Bladder stone removal  2015    There were no vitals taken for this visit.  Visit Diagnosis:  Abnormality of gait      Subjective Assessment - 07/28/14 1155    Symptoms Pt reports doing exercise some; no falls   Currently in Pain? No/denies          [facility] PT Assessment - 07/28/14 1201    Functional Gait  Assessment   Gait assessed  Yes   Gait Level Surface Walks 20 ft in less than 7 sec but greater than 5.5 sec, uses assistive device, slower speed, mild gait deviations, or deviates 6-10 in outside of the 12 in walkway width.   Change in Gait Speed Able to change speed, demonstrates mild gait deviations, deviates 6-10 in outside of the 12 in walkway width, or no gait deviations, unable to achieve a major change in velocity, or uses a change in velocity, or uses an assistive device.   Gait with Horizontal Head Turns Performs head turns smoothly with slight change in gait velocity (eg, minor disruption to smooth gait path), deviates 6-10 in outside 12 in walkway width, or uses an assistive device.   Gait with Vertical Head Turns Performs task with slight change in gait velocity (eg, minor disruption to smooth gait path), deviates 6 -  10 in outside 12 in walkway width or uses assistive device   Gait and Pivot Turn Pivot turns safely within 3 sec and stops quickly with no loss of balance.  cane   Step Over Obstacle Is able to step over one shoe box (4.5 in total height) but must slow down and adjust steps to clear box safely. May require verbal cueing.   Gait with Narrow Base of Support Ambulates less than 4 steps heel to toe or cannot perform without assistance.   Gait with Eyes Closed Walks 20 ft, slow speed, abnormal gait pattern, evidence for imbalance, deviates 10-15 in outside 12 in walkway width. Requires more than 9 sec to ambulate 20 ft.   Ambulating Backwards Walks 20 ft, slow speed, abnormal gait pattern, evidence for imbalance, deviates 10-15 in outside 12 in walkway width.   Steps Alternating feet, must use rail.   Total Score 16     Functional Gait Assessment:  16/30, improved from 12/20 (Scores <22/30 indicate increased fall risk)                     PT Education - 07/29/14 1729    Education Details Updates to HEP, progress with PT, plans for D/C, Parkinson's follow up screens   Person(s) Educated Patient   Methods Explanation;Demonstration;Handout   Comprehension  Verbalized understanding;Returned demonstration             PT Long Term Goals - Aug 16, 2014 1731    PT LONG TERM GOAL #1   Title Pt will be able to perform HEP for Parkinson's specific exercises with wife's supervision.   Status Achieved   PT LONG TERM GOAL #2   Title improve Functional Gait Assessment to at least 17/24 for decreased fall risk.   Status Not Met  Functional Gait assessment 16/30   PT LONG TERM GOAL #3   Title ambulate at least 500 ft. using single point cane, with superivision, for improved ambulation efficiency and safety.   Status Achieved   PT LONG TERM GOAL #4   Title verbalize plans for continued community fitness upon D/C from PT.   Status Achieved               Plan - 08/16/14 1730     Clinical Impression Statement Pt has met LTG #1, 3, 4.  LTG #2 not met.   Pt will benefit from skilled therapeutic intervention in order to improve on the following deficits Abnormal gait;Decreased balance   Rehab Potential Good   PT Next Visit Plan Discharge PT   Consulted and Agree with Plan of Care Patient          G-Codes - 08-16-2014 1732    Functional Assessment Tool Used Functional Gait Assessment 16/30   Functional Limitation Mobility: Walking and moving around   Mobility: Walking and Moving Around Goal Status [phone]) At least 20 percent but less than 40 percent impaired, limited or restricted   Mobility: Walking and Moving Around Discharge Status [phone]) At least 20 percent but less than 40 percent impaired, limited or restricted      Problem List Patient Active Problem List   Diagnosis Date Noted  . Parkinson's disease 11/20/2013  . Essential tremor 11/20/2013  . Gait difficulty 11/20/2013  . Swallowing difficulty 11/20/2013  . Hoarse voice quality 11/20/2013    Shloma Roggenkamp W. Aug 16, 2014, 5:35 PM   Mady Haagensen, PT 2014-08-16 5:36 PM Phone: [phone] Fax: [facility] [address] [place], [place], [address] Phone: [phone]   Fax:  [phone]

## 2014-08-24 ENCOUNTER — Encounter: Payer: Self-pay | Admitting: Physical Therapy

## 2014-08-25 ENCOUNTER — Encounter: Payer: Self-pay | Admitting: Physical Therapy

## 2014-08-25 NOTE — Therapy (Signed)
[facility] [address] [place], [place], [address] Phone: [phone]   Fax:  [phone]  Patient Details  Name: Gabriel Stewart MRN: [id number] Date of Birth: 02/12/1922 Referring Provider:  No ref. provider found  Encounter Date: 08/25/2014 PHYSICAL THERAPY DISCHARGE SUMMARY  Visits from Start of Care: 5  Current functional level related to goals / functional outcomes:  PT LONG TERM GOAL #1    Title  Pt will be able to perform HEP for Parkinson's specific exercises with wife's supervision.    Status  Achieved    PT LONG TERM GOAL #2    Title  improve Functional Gait Assessment to at least 17/24 for decreased fall risk.    Status  Not Met      Functional Gait assessment 16/30    PT LONG TERM GOAL #3    Title  ambulate at least 500 ft. using single point cane, with superivision, for improved ambulation efficiency and safety.    Status  Achieved    PT LONG TERM GOAL #4    Title  verbalize plans for continued community fitness upon D/C from PT.    Status  Achieved       Remaining deficits: Balance; pt remains at fall risk   Education / Equipment: Pt has been educated in HEP, fall prevention, and continued community fitness.  Plan: Patient agrees to discharge.  Patient goals were partially met. Patient is being discharged due to the patient's request.  ?????Pt also had visit limitations due to insurance.      MARRIOTT,AMY W. 08/25/2014, 8:13 AM  Mady Haagensen, PT 08/25/2014 8:15 AM Phone: [phone] Fax: [facility] [address] [place], [place], [address] Phone: [phone]   Fax:  [phone]

## 2014-10-29 ENCOUNTER — Ambulatory Visit
Admission: RE | Admit: 2014-10-29 | Discharge: 2014-10-29 | Disposition: A | Discharge status: Home / Self Care | Payer: Medicare PPO | Source: Ambulatory Visit | Attending: Family Medicine | Admitting: Family Medicine

## 2014-10-29 ENCOUNTER — Other Ambulatory Visit: Payer: Self-pay | Admitting: Family Medicine

## 2014-10-29 DIAGNOSIS — R079 Chest pain, unspecified: Secondary | ICD-10-CM

## 2014-11-16 ENCOUNTER — Ambulatory Visit: Payer: Medicare PPO | Admitting: Physical Therapy

## 2014-11-24 ENCOUNTER — Observation Stay (HOSPITAL_COMMUNITY): Payer: Medicare PPO

## 2014-11-24 ENCOUNTER — Encounter (HOSPITAL_COMMUNITY): Payer: Self-pay

## 2014-11-24 ENCOUNTER — Observation Stay (HOSPITAL_COMMUNITY)
Admission: EM | Admit: 2014-11-24 | Discharge: 2014-11-25 | Disposition: A | Discharge status: Home / Self Care | Payer: Medicare PPO | Attending: Internal Medicine | Admitting: Internal Medicine

## 2014-11-24 DIAGNOSIS — R634 Abnormal weight loss: Secondary | ICD-10-CM | POA: Insufficient documentation

## 2014-11-24 DIAGNOSIS — Z6824 Body mass index (BMI) 24.0-24.9, adult: Secondary | ICD-10-CM | POA: Diagnosis not present

## 2014-11-24 DIAGNOSIS — Z66 Do not resuscitate: Secondary | ICD-10-CM | POA: Insufficient documentation

## 2014-11-24 DIAGNOSIS — I10 Essential (primary) hypertension: Secondary | ICD-10-CM | POA: Insufficient documentation

## 2014-11-24 DIAGNOSIS — E43 Unspecified severe protein-calorie malnutrition: Secondary | ICD-10-CM | POA: Insufficient documentation

## 2014-11-24 DIAGNOSIS — Z7982 Long term (current) use of aspirin: Secondary | ICD-10-CM | POA: Insufficient documentation

## 2014-11-24 DIAGNOSIS — Z87891 Personal history of nicotine dependence: Secondary | ICD-10-CM | POA: Diagnosis not present

## 2014-11-24 DIAGNOSIS — I4510 Unspecified right bundle-branch block: Secondary | ICD-10-CM | POA: Diagnosis present

## 2014-11-24 DIAGNOSIS — Z85820 Personal history of malignant melanoma of skin: Secondary | ICD-10-CM | POA: Insufficient documentation

## 2014-11-24 DIAGNOSIS — Z9841 Cataract extraction status, right eye: Secondary | ICD-10-CM | POA: Insufficient documentation

## 2014-11-24 DIAGNOSIS — E860 Dehydration: Secondary | ICD-10-CM | POA: Diagnosis present

## 2014-11-24 DIAGNOSIS — F1099 Alcohol use, unspecified with unspecified alcohol-induced disorder: Secondary | ICD-10-CM | POA: Diagnosis not present

## 2014-11-24 DIAGNOSIS — I951 Orthostatic hypotension: Secondary | ICD-10-CM | POA: Insufficient documentation

## 2014-11-24 DIAGNOSIS — R55 Syncope and collapse: Principal | ICD-10-CM | POA: Insufficient documentation

## 2014-11-24 DIAGNOSIS — E46 Unspecified protein-calorie malnutrition: Secondary | ICD-10-CM | POA: Diagnosis not present

## 2014-11-24 DIAGNOSIS — Z884 Allergy status to anesthetic agent status: Secondary | ICD-10-CM | POA: Insufficient documentation

## 2014-11-24 DIAGNOSIS — G20 Parkinson's disease: Secondary | ICD-10-CM | POA: Insufficient documentation

## 2014-11-24 DIAGNOSIS — Z9842 Cataract extraction status, left eye: Secondary | ICD-10-CM | POA: Diagnosis not present

## 2014-11-24 HISTORY — DX: Lyme disease, unspecified: A69.20

## 2014-11-24 HISTORY — DX: Calculus of kidney: N20.0

## 2014-11-24 HISTORY — DX: Parkinson's disease: G20

## 2014-11-24 HISTORY — DX: Dizziness and giddiness: R42

## 2014-11-24 HISTORY — DX: Syncope and collapse: R55

## 2014-11-24 HISTORY — DX: Cardiac arrest, cause unspecified: I46.9

## 2014-11-24 HISTORY — DX: Pneumonia, unspecified organism: J18.9

## 2014-11-24 LAB — I-STAT CHEM 8, ED
BUN: 24 mg/dL — AB (ref 6–20)
CHLORIDE: 105 mmol/L (ref 101–111)
Calcium, Ion: 1.21 mmol/L (ref 1.13–1.30)
Creatinine, Ser: 0.9 mg/dL (ref 0.61–1.24)
GLUCOSE: 131 mg/dL — AB (ref 70–99)
HCT: 35 % — ABNORMAL LOW (ref 39.0–52.0)
Hemoglobin: 11.9 g/dL — ABNORMAL LOW (ref 13.0–17.0)
Potassium: 3.6 mmol/L (ref 3.5–5.1)
Sodium: 142 mmol/L (ref 135–145)
TCO2: 21 mmol/L (ref 0–100)

## 2014-11-24 LAB — CBC
HCT: 32.8 % — ABNORMAL LOW (ref 39.0–52.0)
Hemoglobin: 10.9 g/dL — ABNORMAL LOW (ref 13.0–17.0)
MCH: 30.9 pg (ref 26.0–34.0)
MCHC: 33.2 g/dL (ref 30.0–36.0)
MCV: 92.9 fL (ref 78.0–100.0)
Platelets: 210 10*3/uL (ref 150–400)
RBC: 3.53 MIL/uL — ABNORMAL LOW (ref 4.22–5.81)
RDW: 13.3 % (ref 11.5–15.5)
WBC: 9.9 10*3/uL (ref 4.0–10.5)

## 2014-11-24 LAB — POC OCCULT BLOOD, ED: FECAL OCCULT BLD: NEGATIVE

## 2014-11-24 LAB — BASIC METABOLIC PANEL
Anion gap: 8 (ref 5–15)
BUN: 22 mg/dL — AB (ref 6–20)
CALCIUM: 8.5 mg/dL — AB (ref 8.9–10.3)
CO2: 24 mmol/L (ref 22–32)
CREATININE: 1 mg/dL (ref 0.61–1.24)
Chloride: 108 mmol/L (ref 101–111)
GFR calc non Af Amer: >60 mL/min (ref >60)
Glucose, Bld: 134 mg/dL — ABNORMAL HIGH (ref 70–99)
POTASSIUM: 3.7 mmol/L (ref 3.5–5.1)
Sodium: 140 mmol/L (ref 135–145)

## 2014-11-24 LAB — TSH: TSH: 1.22 u[IU]/mL (ref 0.350–4.500)

## 2014-11-24 LAB — I-STAT TROPONIN, ED: Troponin i, poc: 0 ng/mL (ref 0.00–0.08)

## 2014-11-24 LAB — TROPONIN I
Troponin I: <0.03 ng/mL (ref <0.031)
Troponin I: <0.03 ng/mL (ref <0.031)

## 2014-11-24 MED ORDER — ONDANSETRON HCL 4 MG/2ML IJ SOLN: 4.0000 mg | Freq: Four times a day (QID) | INTRAMUSCULAR | Status: DC | PRN

## 2014-11-24 MED ORDER — ONDANSETRON HCL 4 MG PO TABS: 4.0000 mg | ORAL_TABLET | Freq: Four times a day (QID) | ORAL | Status: DC | PRN

## 2014-11-24 MED ORDER — CARBIDOPA-LEVODOPA 25-100 MG PO TABS
2.0000 | ORAL_TABLET | Freq: Once | ORAL | Status: AC
  Administered 2014-11-24: 2 via ORAL
  Filled 2014-11-24: qty 2

## 2014-11-24 MED ORDER — SODIUM CHLORIDE 0.9 % IJ SOLN
3.0000 mL | Freq: Two times a day (BID) | INTRAMUSCULAR | Status: DC
  Administered 2014-11-24: 3 mL via INTRAVENOUS

## 2014-11-24 MED ORDER — ACETAMINOPHEN 650 MG RE SUPP: 650.0000 mg | Freq: Four times a day (QID) | RECTAL | Status: DC | PRN

## 2014-11-24 MED ORDER — DUTASTERIDE-TAMSULOSIN HCL 0.5-0.4 MG PO CAPS: ORAL_CAPSULE | Freq: Every morning | ORAL | Status: DC

## 2014-11-24 MED ORDER — ACETAMINOPHEN 325 MG PO TABS: 650.0000 mg | ORAL_TABLET | Freq: Four times a day (QID) | ORAL | Status: DC | PRN

## 2014-11-24 MED ORDER — SODIUM CHLORIDE 0.9 % IV SOLN
INTRAVENOUS | Status: AC
Start: 2014-11-24 — End: 2014-11-25
  Administered 2014-11-24 – 2014-11-25 (×2): via INTRAVENOUS

## 2014-11-24 MED ORDER — ENOXAPARIN SODIUM 40 MG/0.4ML ~~LOC~~ SOLN
40.0000 mg | SUBCUTANEOUS | Status: DC
  Administered 2014-11-24: 40 mg via SUBCUTANEOUS
  Filled 2014-11-24 (×2): qty 0.4

## 2014-11-24 MED ORDER — TAMSULOSIN HCL 0.4 MG PO CAPS
0.4000 mg | ORAL_CAPSULE | Freq: Every day | ORAL | Status: DC
  Filled 2014-11-24: qty 1

## 2014-11-24 MED ORDER — CARBIDOPA-LEVODOPA 25-100 MG PO TABS
2.0000 | ORAL_TABLET | ORAL | Status: DC
  Administered 2014-11-24 – 2014-11-25 (×3): 2 via ORAL
  Filled 2014-11-24 (×5): qty 2

## 2014-11-24 MED ORDER — DUTASTERIDE 0.5 MG PO CAPS
0.5000 mg | ORAL_CAPSULE | Freq: Every day | ORAL | Status: DC
  Filled 2014-11-24: qty 1

## 2014-11-24 MED ORDER — ALUM & MAG HYDROXIDE-SIMETH 200-200-20 MG/5ML PO SUSP: 30.0000 mL | Freq: Four times a day (QID) | ORAL | Status: DC | PRN

## 2014-11-24 MED ORDER — ASPIRIN 325 MG PO TABS
325.0000 mg | ORAL_TABLET | Freq: Every day | ORAL | Status: DC
  Administered 2014-11-25: 325 mg via ORAL
  Filled 2014-11-24: qty 1

## 2014-11-24 MED ORDER — ENSURE ENLIVE PO LIQD
237.0000 mL | Freq: Three times a day (TID) | ORAL | Status: DC
  Administered 2014-11-24 – 2014-11-25 (×4): 237 mL via ORAL

## 2014-11-24 MED ORDER — DOCUSATE SODIUM 100 MG PO CAPS
100.0000 mg | ORAL_CAPSULE | Freq: Two times a day (BID) | ORAL | Status: DC
  Administered 2014-11-24 – 2014-11-25 (×2): 100 mg via ORAL
  Filled 2014-11-24 (×3): qty 1

## 2014-11-24 NOTE — Progress Notes (Signed)
Made oncoming night shift aware the patients wife needs to bring in the patients advance directive DNR paperwork and to relay this to day shift. Wife states this paperwork has been filled out previously.

## 2014-11-24 NOTE — H&P (Signed)
[facility] History and Physical                                                                                    Gabriel Stewart, is a 81 y.o. male  MRN: [id number]   DOB - 1922-02-17  Admit Date - 11/24/2014  Outpatient Primary MD for the patient is Gerrit Heck, MD  Referring Physician:  Dr. Jeneen Rinks, EDP Neurologist Andrey Spearman  Chief Complaint:   Chief Complaint  Patient presents with  . Loss of Consciousness     HPI  Gabriel Stewart  is a 81 y.o. male, with a past medical history of Parkinson's, melanoma, hypertension and complete heart block (per his wife) presents to the emergency department after a syncopal episode earlier today. Gabriel Stewart is a poor historian but is able to relate that he was at country barbecue eating lunch when he got up to go to the bathroom. He felt dizzy on standing and was unable to walk to the bathroom. He lost consciousness and was gently lowered to the floor. The next thing he remembers is being in the ambulance. Gabriel Stewart gives a lifelong history of intermittent syncopal episodes. Unfortunately his wife was not present during the episode but Gabriel Stewart denies any confusion. He is unsure of how long he was unconscious. He did not bite his tongue.  His wife reports that he is frequently dizzy particularly when walking. He will feel as though he is going to pass out and find a place to sit down. He denies any recent illness, chest pain, changes in vision or headache.  His wife also reports an unintentional weight loss of 30 pounds over the past year, and a yeast infection in his mouth that has taken months to clear up.  In the emergency department labs indicate that he is mildly dehydrated with an elevated BUN (24), his POC troponin is 0, he denies chest pain. EKG shows a right bundle branch block which the patient reports is known. There is no echo cardiogram in our system as the patient moved here not long ago from [place]. He has no  cardiologist.  Review of Systems   In addition to the HPI above,  No Fever-chills, No Headache, No changes with Vision or hearing, No problems swallowing food or Liquids, No Chest pain, Cough or Shortness of Breath, No Abdominal pain, No Nausea or Vomiting, Bowel movements are regular, No Blood in stool or Urine, No dysuria, No new skin rashes or bruises, No new joints pains-aches,  No new weakness, tingling, numbness in any extremity, No recent weight gain or loss, A full 10 point Review of Systems was done, except as stated above, all other Review of Systems were negative.  Past Medical History  Past Medical History  Diagnosis Date  . Hypertension   . Melanoma 2010    skin  . Parkinson's disease     Past Surgical History  Procedure Laterality Date  . Cataract extraction Bilateral 1970  . Circumcision  2015  . Bladder stone removal  2015      Social History History  Substance Use Topics  . Smoking status: Former Research scientist (life sciences)  . Smokeless tobacco:  Never Used     Comment: quit in 1960  . Alcohol Use: Yes     Comment: 1 shots daily  He drinks 1 shot of bourbon every evening. He had a 5-pack-year smoking history but no longer smokes. He lives at home with his wife. He is independent with his ADLs.  Family History Family History  Problem Relation Age of Onset  . Cancer - Prostate Sister   . Appendicitis Brother   His father died with heart failure. There is no other known cardiac disease in the family  Prior to Admission medications   Medication Sig Start Date End Date Taking? Authorizing Provider  aspirin 325 MG tablet Take 325 mg by mouth daily.   Yes Historical Provider, MD  b complex vitamins capsule Take 1 capsule by mouth daily.   Yes Historical Provider, MD  carbidopa-levodopa (SINEMET IR) 25-100 MG per tablet Take 2 tablets by mouth 3 (three) times daily. 06/29/14  Yes Penni Bombard, MD  Dutasteride-Tamsulosin HCl 0.5-0.4 MG CAPS Take by mouth. Once daily    Yes Historical Provider, MD  Multiple Vitamins-Minerals (CENTRUM ADULTS) TABS Take 1 tablet by mouth daily.   Yes Historical Provider, MD  vitamin B-12 (CYANOCOBALAMIN) 1000 MCG tablet Take 1,000 mcg by mouth daily.   Yes Historical Provider, MD  vitamin E 100 UNIT capsule Take 100 Units by mouth daily.   Yes Historical Provider, MD    Allergies  Allergen Reactions  . Novocain [Procaine]     syncope    Physical Exam  Vitals  Blood pressure 146/63, pulse 67, temperature 97.4 F (36.3 C), temperature source Oral, resp. rate 19, SpO2 97 %.   General: Well-developed, well-nourished, elderly male  lying in bed in NAD, Wife at bedside  Psych:  Mildly confused, cooperative, alert and oriented, very pleasant, well-groomed  Neuro:   No F.N deficits, ALL C.Nerves Intact, Strength 5/5 all 4 extremities, Sensation intact all 4 extremities.  ENT:  Ears and Eyes appear Normal, Conjunctivae clear, PER. Moist oral mucosa without erythema or exudates.  Neck:  Supple, No lymphadenopathy appreciated  Respiratory:  Symmetrical chest wall movement, Good air movement bilaterally, CTAB.  Cardiac:  RRR, No Murmurs, no LE edema noted, no JVD.    Abdomen:  Positive bowel sounds, Soft, Non tender, Non distended,  No masses appreciated  Skin:  No Cyanosis, Normal Skin Turgor, No Skin Rash or Bruise.  Extremities:  Able to move all 4. 5/5 strength in each,  no effusions.  Data Review  CBC  Recent Labs Lab 11/24/14 0935 11/24/14 1012  WBC 9.9  --   HGB 10.9* 11.9*  HCT 32.8* 35.0*  PLT 210  --   MCV 92.9  --   MCH 30.9  --   MCHC 33.2  --   RDW 13.3  --     Chemistries   Recent Labs Lab 11/24/14 0935 11/24/14 1012  NA 140 142  K 3.7 3.6  CL 108 105  CO2 24  --   GLUCOSE 134* 131*  BUN 22* 24*  CREATININE 1.00 0.90  CALCIUM 8.5*  --      Imaging results:   Dg Ribs Unilateral W/chest Right  10/29/2014   CLINICAL DATA:  Fall 3 weeks ago with anterior right lower rib pain.   EXAM: RIGHT RIBS AND CHEST - 3+ VIEW  COMPARISON:  07/28/2013  FINDINGS: Lungs are adequately inflated without focal consolidation or effusion. Cardiomediastinal silhouette is within normal. There is calcified plaque over the aortic  arch. There is a minimally displaced acute fracture of the anterior lateral right ninth rib. There are degenerative changes of the spine.  IMPRESSION: No acute cardiopulmonary disease.  Minimally displaced acute fracture of the anterior lateral right ninth rib.   Electronically Signed   By: Marin Olp M.D.   On: 10/29/2014 14:16    My personal review of EKG: Right bundle branch block. No previous tracing.  Assessment & Plan  Principal Problem:   Syncope Active Problems:   RBBB   Orthostatic hypotension   Dehydration   Unintentional weight loss  Syncopal episode Patient has mild orthostatic hypotension, Parkinson's disease, and a history of syncopal episodes. We will admit him to telemetry, check a 2-D echo, check troponins, check TSH. At this point we will not consult cardiology. If his troponins are elevated or his echo shows abnormality we would consider cardiology consultation. PT evaluation requested. Daily orthostatics requested.  Orthostatic hypotension Likely secondary to poor by mouth intake and dehydration Will give gentle IV fluids for 24 hours, and reassess orthostatics daily. thigh-high TED hose ordered. I am concerned this may be autonomic dysfunction associated with progressive Parkinson's disease.  Parkinson's disease Continue carbidopa levodopa Recommend continued close follow-up with outpatient neurology.  Unintentional weight loss Patient drinks 3 ensures per day. These have been ordered as an inpatient as well Nutrition consultation Continue workup outpatient with primary care physician.  History of hypertension orthostatic hypotension Not currently on blood pressure medications. BP is normal. If orthostasis does not improve  with IV hydration tamsulosin therapy may need to be adjusted or discontinued.   Consultants Called:  None  Family Communication:   Wife at bedside  Code Status:  DO NOT RESUSCITATE per wife  Condition:  Stable  Potential Disposition: Likely to home when no longer orthostatic if workup is negative.  Time spent in minutes : [address]  PA-C on 11/24/2014 at 3:24 PM Between 7am to 7pm - Pager - [phone] After 7pm go to [email or web address] - password [email or web address] And look for the night coverage person covering me after hours  [facility]  Attending MD note  Patient was seen, examined,treatment plan was discussed with the Advance Practice Provider.  I have personally reviewed the clinical findings, lab,EKG, imaging studies and management of this patient in detail. I agree with the documentation, as recorded by the Advance Practice Provider with changes as made.   Patient presented after sustaining a syncopal episode while at a restaurant. He apparently had stood up and was going to the bathroom when he passed out. There was no chest pain, shortness of breath, either prior to or after the episode. No focal deficits were noted by the patient or family. In the emergency department he was noted to be orthostatic. Patient does have a history of Parkinson's and could have autonomic dysfunction which could be contributing. It appears that patient gets these symptoms periodically. Observation for workup as mentioned above. Agree with TED stockings. PT and OT evaluation.   [facility]  [phone]

## 2014-11-24 NOTE — ED Provider Notes (Signed)
CSN: [id number]     Arrival date & time 11/24/14  0915 History   First MD Initiated Contact with Patient 11/24/14 [id number]     Chief Complaint  Patient presents with  . Loss of Consciousness      HPI  Patient presents for evaluation after an episode of syncope. He was in his normal state of health. He went to a restaurant this morning. He was picked up by a friend and taken to the restaurant. He finished his meal. He states he was feeling weak. He got up to go to the restroom. She simply had to use the restroom. Was not hurting because of pain or weakness or nausea. En route to the restroom he states he was a few feet away from the door and he felt suddenly very weak. The next thing he remembers he is laying on the ground and her people around him. He was then transported here stable en route by EMS.  He denies chest pain shortness of breath or recent illness. No blood in his stools now or recently.  History of Parkinson's disease and hypertension.  A series passed out 3 times in his life. Once when he was made to jump into an ice bath when he was pledging a fraternity, once when he was given an injection for a dental procedure, and once when he had to stand at "parade rest" when he was in the [employer].  Past Medical History  Diagnosis Date  . Hypertension   . Melanoma 2010    skin  . Parkinson's disease    Past Surgical History  Procedure Laterality Date  . Cataract extraction Bilateral 1970  . Circumcision  2015  . Bladder stone removal  2015   Family History  Problem Relation Age of Onset  . Cancer - Prostate Sister   . Appendicitis Brother    History  Substance Use Topics  . Smoking status: Former Research scientist (life sciences)  . Smokeless tobacco: Never Used     Comment: quit in 1960  . Alcohol Use: Yes     Comment: 1 shots daily    Review of Systems  Constitutional: Negative for fever, chills, diaphoresis, appetite change and fatigue.  HENT: Negative for mouth sores, sore throat and trouble  swallowing.   Eyes: Negative for visual disturbance.  Respiratory: Negative for cough, chest tightness, shortness of breath and wheezing.   Cardiovascular: Negative for chest pain.  Gastrointestinal: Negative for nausea, vomiting, abdominal pain, diarrhea and abdominal distention.  Endocrine: Negative for polydipsia, polyphagia and polyuria.  Genitourinary: Negative for dysuria, frequency and hematuria.  Musculoskeletal: Negative for gait problem.  Skin: Negative for color change, pallor and rash.  Neurological: Positive for syncope and weakness. Negative for dizziness, light-headedness and headaches.  Hematological: Does not bruise/bleed easily.  Psychiatric/Behavioral: Negative for behavioral problems and confusion.      Allergies  Novocain  Home Medications   Prior to Admission medications   Medication Sig Start Date End Date Taking? Authorizing Provider  aspirin 325 MG tablet Take 325 mg by mouth daily.   Yes Historical Provider, MD  b complex vitamins capsule Take 1 capsule by mouth daily.   Yes Historical Provider, MD  carbidopa-levodopa (SINEMET IR) 25-100 MG per tablet Take 2 tablets by mouth 3 (three) times daily. 06/29/14  Yes Penni Bombard, MD  Dutasteride-Tamsulosin HCl 0.5-0.4 MG CAPS Take by mouth. Once daily   Yes Historical Provider, MD  Multiple Vitamins-Minerals (CENTRUM ADULTS) TABS Take 1 tablet by mouth daily.  Yes Historical Provider, MD  vitamin B-12 (CYANOCOBALAMIN) 1000 MCG tablet Take 1,000 mcg by mouth daily.   Yes Historical Provider, MD  vitamin E 100 UNIT capsule Take 100 Units by mouth daily.   Yes Historical Provider, MD   BP 152/87 mmHg  Pulse 78  Temp(Src) 97.4 F (36.3 C) (Oral)  Resp 17  SpO2 98% Physical Exam  Constitutional: He is oriented to person, place, and time. He appears well-developed and well-nourished. No distress.  HENT:  Head: Normocephalic.  Eyes: Conjunctivae are normal. Pupils are equal, round, and reactive to light.  No scleral icterus.  Conjunctivae appear pale  Neck: Normal range of motion. Neck supple. No thyromegaly present.  Cardiovascular: Normal rate and regular rhythm.  Exam reveals no gallop and no friction rub.   No murmur heard. Pulmonary/Chest: Effort normal and breath sounds normal. No respiratory distress. He has no wheezes. He has no rales.  Abdominal: Soft. Bowel sounds are normal. He exhibits no distension. There is no tenderness. There is no rebound.  Musculoskeletal: Normal range of motion.  Neurological: He is alert and oriented to person, place, and time.  Skin: Skin is warm and dry. No rash noted.  Psychiatric: He has a normal mood and affect. His behavior is normal.    ED Course  Procedures (including critical care time) Labs Review Labs Reviewed  CBC - Abnormal; Notable for the following:    RBC 3.53 (*)    Hemoglobin 10.9 (*)    HCT 32.8 (*)    All other components within normal limits  BASIC METABOLIC PANEL - Abnormal; Notable for the following:    Glucose, Bld 134 (*)    BUN 22 (*)    Calcium 8.5 (*)    All other components within normal limits  I-STAT CHEM 8, ED - Abnormal; Notable for the following:    BUN 24 (*)    Glucose, Bld 131 (*)    Hemoglobin 11.9 (*)    HCT 35.0 (*)    All other components within normal limits  I-STAT TROPOININ, ED  POC OCCULT BLOOD, ED    Imaging Review No results found.   EKG Interpretation   Date/Time:  Wednesday Nov 24 2014 09:20:44 EDT Ventricular Rate:  54 PR Interval:  198 QRS Duration: 159 QT Interval:  493 QTC Calculation: 467 R Axis:   80 Text Interpretation:  Sinus rhythm Right bundle branch block Confirmed by  Jeneen Rinks  MD, [place] ([id number]) on 11/24/2014 9:29:14 AM      MDM   Final diagnoses:  Syncope and collapse    Patient slightly anemic at 10. Guaiac negative. Otherwise unrevealing ER evaluation. Patient call to [facility] regarding hospitalization and telemetry monitoring for this minimally provoked  syncopal episode.    Tanna Furry, MD 11/24/14 [phone]

## 2014-11-24 NOTE — ED Notes (Signed)
Pt. Was at country bbq when he went to the bathroom and had a syncopal episode. Positive LOC, did not fall (was lowered to ground). Pt. Pale and diaphoretic. Pt. Bradycardic at 46. CBG 135 , BP 128/55. Pt. Given 800 ml NS in route and 4 mg Zofran. Pt. With hx of parkinsons. Denies CP/SOB.

## 2014-11-25 ENCOUNTER — Observation Stay (HOSPITAL_COMMUNITY): Payer: Medicare PPO

## 2014-11-25 DIAGNOSIS — E43 Unspecified severe protein-calorie malnutrition: Secondary | ICD-10-CM | POA: Insufficient documentation

## 2014-11-25 DIAGNOSIS — I4510 Unspecified right bundle-branch block: Secondary | ICD-10-CM | POA: Diagnosis not present

## 2014-11-25 DIAGNOSIS — E46 Unspecified protein-calorie malnutrition: Secondary | ICD-10-CM | POA: Diagnosis not present

## 2014-11-25 DIAGNOSIS — I951 Orthostatic hypotension: Secondary | ICD-10-CM | POA: Diagnosis not present

## 2014-11-25 DIAGNOSIS — R55 Syncope and collapse: Secondary | ICD-10-CM | POA: Diagnosis not present

## 2014-11-25 LAB — GLUCOSE, CAPILLARY
Glucose-Capillary: 84 mg/dL (ref 70–99)
Glucose-Capillary: 88 mg/dL (ref 70–99)

## 2014-11-25 LAB — TROPONIN I: Troponin I: <0.03 ng/mL (ref <0.031)

## 2014-11-25 NOTE — Progress Notes (Signed)
Initial Nutrition Assessment  DOCUMENTATION CODES:  Non-severe (moderate) malnutrition in context of chronic illness  INTERVENTION:  Ensure Enlive (each supplement provides 350kcal and 20 grams of protein)  NUTRITION DIAGNOSIS:  Malnutrition related to chronic illness as evidenced by moderate depletions of muscle mass, moderate depletion of body fat.   GOAL:  Patient will meet greater than or equal to 90% of their needs   MONITOR:  PO intake, Supplement acceptance, Weight trends, Labs, I & O's, Skin  REASON FOR ASSESSMENT:  Malnutrition Screening Tool, Consult Assessment of nutrition requirement/status  ASSESSMENT: Gabriel Stewart is a 81 y.o. male, with a past medical history of Parkinson's, melanoma, hypertension and complete heart block (per his wife) presents to the emergency department after a syncopal episode earlier today. [name] is a poor historian but is able to relate that he was at country barbecue eating lunch when he got up to go to the bathroom. He felt dizzy on standing and was unable to walk to the bathroom. He lost consciousness and was gently lowered to the floor. The next thing he remembers is being in the ambulance. [name] gives a lifelong history of intermittent syncopal episodes. Unfortunately his wife was not present during the episode but [name] denies any confusion. He is unsure of how long he was unconscious. He did not bite his tongue. His wife reports that he is frequently dizzy particularly when walking. He will feel as though he is going to pass out and find a place to sit down. He denies any recent illness, chest pain, changes in vision or headache. His wife also reports an unintentional weight loss of 30 pounds over the past year, and a yeast infection in his mouth that has taken months to clear up.  Pt admitted with syncope. He admits a poor appetite and weight loss over the past year. Chart review reveals a 30# (14%) wt loss over  the past year. Pt reports that he has regained some of his weight back due to improved appetite and consuming Ensure supplements. He reveals that he typically consumes 3 Ensure supplements daily and recently started purchasing to Ensure Plus to consume additional calories. Ensure has already been ordered TID and pt has been drinking.   Pt reports he typically leads a very active lifestyle. Noted moderate to severe fat and muscle depletion on nutrition-focused physical exam.  Discussed importance of good PO intake to promote healing. Encouraged pt to continue drinking Ensure supplements.    Height:  Ht Readings from Last 1 Encounters:  11/25/14 6' (1.829 m)    Weight:  Wt Readings from Last 1 Encounters:  11/25/14 179 lb 11.2 oz (81.511 kg)    Ideal Body Weight:  80.9 kg  Wt Readings from Last 10 Encounters:  11/25/14 179 lb 11.2 oz (81.511 kg)  06/29/14 179 lb 12.8 oz (81.557 kg)  11/20/13 186 lb (84.369 kg)    BMI:  Body mass index is 24.37 kg/(m^2). Normal weight rage  Estimated Nutritional Needs:  Kcal:  2000-2200  Protein:  95-105 grams  Fluid:  2.0-2.2 L  Skin:  Reviewed, no issues  Diet Order:  Diet Heart Room service appropriate?: Yes; Fluid consistency:: Thin  EDUCATION NEEDS:  Education needs addressed   Intake/Output Summary (Last 24 hours) at 11/25/14 1503 Last data filed at 11/25/14 1300  Gross per 24 hour  Intake   1789 ml  Output   1200 ml  Net    589 ml    Last BM:  11/25/14  [name] A. Jimmye Norman, RD, LDN, CDE Pager: [phone] After hours Pager: [phone]

## 2014-11-25 NOTE — Care Management Note (Signed)
Case Management Note  Patient Details  Name: Gabriel Stewart MRN: [id number] Date of Birth: Jun 26, 1922  Subjective/Objective:               Patient for dc today, per physical therapy no pt f/u needs.    Action/Plan:   Expected Discharge Date:  11/25/14               Expected Discharge Plan:  Home/Self Care  In-House Referral:     Discharge planning Services  CM Consult  Post Acute Care Choice:    Choice offered to:     DME Arranged:    DME Agency:     HH Arranged:    HH Agency:     Status of Service:     Medicare Important Message Given:  No Date Medicare IM Given:    Medicare IM give by:    Date Additional Medicare IM Given:    Additional Medicare Important Message give by:     If discussed at [place] of Stay Meetings, dates discussed:    Additional Comments:  Zenon Mayo, RN 11/25/2014, 1:25 PM

## 2014-11-25 NOTE — Discharge Summary (Signed)
Physician Discharge Summary  Gabriel Stewart WUX:[id number] DOB: 09/14/1921 DOA: 11/24/2014  PCP: Gerrit Heck, MD  Admit date: 11/24/2014 Discharge date: 11/25/2014  Recommendations for Outpatient Follow-up:    Discharge Diagnoses:  Principal Problem:   Syncope Active Problems:   RBBB   Orthostatic hypotension   Dehydration   Unintentional weight loss   Syncope and collapse   Protein-calorie malnutrition, severe   Discharge Condition: stable  Diet recommendation: heart healthy  Filed Weights   11/25/14 0444  Weight: 81.511 kg (179 lb 11.2 oz)    History of present illness:  81 y.o. male, with a past medical history of Parkinson's, melanoma, hypertension and complete heart block (per his wife) presents to the emergency department after a syncopal episode earlier today. Gabriel Stewart is a poor historian but is able to relate that he was at country barbecue eating lunch when he got up to go to the bathroom. He felt dizzy on standing and was unable to walk to the bathroom. He lost consciousness and was gently lowered to the floor. The next thing he remembers is being in the ambulance. Gabriel Stewart gives a lifelong history of intermittent syncopal episodes. Unfortunately his wife was not present during the episode but Gabriel Stewart denies any confusion. He is unsure of how long he was unconscious. He did not bite his tongue. His wife reports that he is frequently dizzy particularly when walking. He will feel as though he is going to pass out and find a place to sit down. He denies any recent illness, chest pain, changes in vision or headache. His wife also reports an unintentional weight loss of 30 pounds over the past year, and a yeast infection in his mouth that has taken months to clear up.  In the emergency department labs indicate that he is mildly dehydrated with an elevated BUN (24), his POC troponin is 0, he denies chest pain. EKG shows a right bundle branch block which  the patient reports is known. There is no echo cardiogram in our system as the patient moved here not long ago from [place]. He has no cardiologist.  Hospital Course:  Orthostatic vitals dropped. Likely the culprit.  Monitored on tele.  Given IVF. Was on lisinopril on admission (not on home med rec in epic).  Recommend he stop this and monitor standing blood pressures.  No further syncope.  Echo ok.  Procedures:  none  Consultations:  none  Discharge Exam: Filed Vitals:   11/25/14 1433  BP: 153/58  Pulse: 59  Temp: 98.1 F (36.7 C)  Resp: 16    General: a and o Cardiovascular: RRR Respiratory: CTA  Discharge Instructions   Discharge Instructions    Diet general    Complete by:  As directed      Discharge instructions    Complete by:  As directed   Stop lisinopril. Monitor standing blood pressure.     Increase activity slowly    Complete by:  As directed           Current Discharge Medication List    CONTINUE these medications which have NOT CHANGED   Details  aspirin 325 MG tablet Take 325 mg by mouth daily.    b complex vitamins capsule Take 1 capsule by mouth daily.    carbidopa-levodopa (SINEMET IR) 25-100 MG per tablet Take 2 tablets by mouth 3 (three) times daily. Qty: 180 tablet, Refills: 12    Dutasteride-Tamsulosin HCl 0.5-0.4 MG CAPS Take by mouth. Once daily  Multiple Vitamins-Minerals (CENTRUM ADULTS) TABS Take 1 tablet by mouth daily.    vitamin B-12 (CYANOCOBALAMIN) 1000 MCG tablet Take 1,000 mcg by mouth daily.    vitamin E 100 UNIT capsule Take 100 Units by mouth daily.       Allergies  Allergen Reactions  . Novocain [Procaine]     syncope   Follow-up Information    Follow up with Gerrit Heck, MD.   Specialty:  Family Medicine   Contact information:   [address] [place] [id number] [phone]        The results of significant diagnostics from this hospitalization (including imaging, microbiology,  ancillary and laboratory) are listed below for reference.    Significant Diagnostic Studies: X-ray Chest Pa And Lateral  11/24/2014   CLINICAL DATA:  Syncopal episode today. Weakness. Initial encounter.  EXAM: CHEST  2 VIEW  COMPARISON:  10/29/2014 and 07/28/2013.  FINDINGS: The heart size and mediastinal contours are normal. The lungs are clear. There is no pleural effusion or pneumothorax. No acute osseous findings are identified. Mild thoracic spine degenerative changes noted.  IMPRESSION: Stable chest.  No acute cardiopulmonary process.   Electronically Signed   By: Richardean Sale M.D.   On: 11/24/2014 18:10   Dg Ribs Unilateral W/chest Right  10/29/2014   CLINICAL DATA:  Fall 3 weeks ago with anterior right lower rib pain.  EXAM: RIGHT RIBS AND CHEST - 3+ VIEW  COMPARISON:  07/28/2013  FINDINGS: Lungs are adequately inflated without focal consolidation or effusion. Cardiomediastinal silhouette is within normal. There is calcified plaque over the aortic arch. There is a minimally displaced acute fracture of the anterior lateral right ninth rib. There are degenerative changes of the spine.  IMPRESSION: No acute cardiopulmonary disease.  Minimally displaced acute fracture of the anterior lateral right ninth rib.   Electronically Signed   By: Marin Olp M.D.   On: 10/29/2014 14:16   Echo Left ventricle: The cavity size was normal. Wall thickness was increased in a pattern of mild LVH. Systolic function was normal. The estimated ejection fraction was in the range of 55% to 60%. Wall motion was normal; there were no regional wall motion abnormalities. Doppler parameters are consistent with abnormal left ventricular relaxation (grade 1 diastolic dysfunction). - Aortic valve: There was no stenosis. - Aorta: Mildly dilated aortic root. Aortic root dimension: 39 mm (ED). - Mitral valve: Mildly calcified annulus. There was no significant regurgitation. - Left atrium: The atrium was  mildly dilated. - Right ventricle: The cavity size was normal. Systolic function was normal. - Right atrium: The atrium was mildly dilated. - Pulmonary arteries: No complete TR doppler jet so unable to estimate PA systolic pressure. - Inferior vena cava: The vessel was normal in size. The respirophasic diameter changes were in the normal range (>= 50%), consistent with normal central venous pressure.  Impressions:  - Normal LV size with mild LV hypertrophy. EF 55-60%. Normal RV size and systolic function. No significant valvular abnormalities. Microbiology: No results found for this or any previous visit (from the past 240 hour(s)).   Labs: Basic Metabolic Panel:  Recent Labs Lab 11/24/14 0935 11/24/14 1012  NA 140 142  K 3.7 3.6  CL 108 105  CO2 24  --   GLUCOSE 134* 131*  BUN 22* 24*  CREATININE 1.00 0.90  CALCIUM 8.5*  --    Liver Function Tests: No results for input(s): AST, ALT, ALKPHOS, BILITOT, PROT, ALBUMIN in the last 168 hours. No results  for input(s): LIPASE, AMYLASE in the last 168 hours. No results for input(s): AMMONIA in the last 168 hours. CBC:  Recent Labs Lab 11/24/14 0935 11/24/14 1012  WBC 9.9  --   HGB 10.9* 11.9*  HCT 32.8* 35.0*  MCV 92.9  --   PLT 210  --    Cardiac Enzymes:  Recent Labs Lab 11/24/14 1630 11/24/14 2209 11/25/14 0425  TROPONINI <0.03 <0.03 <0.03   BNP: BNP (last 3 results) No results for input(s): BNP in the last 8760 hours.  ProBNP (last 3 results) No results for input(s): PROBNP in the last 8760 hours.  CBG:  Recent Labs Lab 11/25/14 0422 11/25/14 0803  GLUCAP 84 88       Signed:  Surry L  [facility] 11/25/2014, 4:22 PM

## 2014-11-25 NOTE — Progress Notes (Signed)
  Echocardiogram 2D Echocardiogram has been performed.  Juliya Magill 11/25/2014, 11:27 AM

## 2014-11-25 NOTE — Evaluation (Signed)
Physical Therapy Evaluation Patient Details Name: Gabriel Stewart MRN: [id number] DOB: 10-27-1921 Today's Date: 11/25/2014   History of Present Illness  Pt is a 81 y.o. male, with a PMH of Parkinson's, melanoma, hypertension and complete heart block (per his wife) presents to the ED after a syncopal episode earlier today. Gabriel Stewart is a poor historian but is able to relate that he was eating lunch when he got up to go to the bathroom. He felt dizzy on standing and was unable to walk to the bathroom. He lost consciousness and was gently lowered to the floor. The next thing he remembers is being in the ambulance. Gabriel Stewart gives a lifelong history of intermittent syncopal episodes.   Clinical Impression  Pt admitted with above diagnosis. Pt currently with functional limitations due to the deficits listed below (see PT Problem List). At the time of PT eval pt was able to perform transfers and ambulation with supervision to min assist. Pt reports he is close to baseline of function. Will keep on acute PT caseload to prevent further decline. Pt will benefit from skilled PT to increase their independence and safety with mobility to allow discharge to the venue listed below.       Follow Up Recommendations No PT follow up    Equipment Recommendations  None recommended by PT    Recommendations for Other Services       Precautions / Restrictions Precautions Precautions: Fall Restrictions Weight Bearing Restrictions: No      Mobility  Bed Mobility Overal bed mobility: Needs Assistance Bed Mobility: Supine to Sit     Supine to sit: Supervision     General bed mobility comments: Increased time to transition to EOB. Pt required increased time and use of bed rails for support. Noted some initial trunk instability when coming to sit but pt was able to self-correct.  Transfers Overall transfer level: Needs assistance Equipment used: Quad cane Transfers: Sit to/from Stand Sit to Stand:  Supervision         General transfer comment: Pt was able to power-up to full standing without assistance. No LOB or unsteadiness noted.  Ambulation/Gait Ambulation/Gait assistance: Min assist Ambulation Distance (Feet): 450 Feet Assistive device: Quad cane Gait Pattern/deviations: Festinating;Trunk flexed Gait velocity: Decreased Gait velocity interpretation: Below normal speed for age/gender General Gait Details: Parkinsonian gait pattern when first beginning ambulation and then can increase his step/stride length. Pt with 1 LOB while turning which required min assist to recover.   Stairs            Wheelchair Mobility    Modified Rankin (Stroke Patients Only)       Balance Overall balance assessment: Needs assistance Sitting-balance support: Feet supported;No upper extremity supported Sitting balance-Leahy Scale: Fair     Standing balance support: Single extremity supported Standing balance-Leahy Scale: Fair                               Pertinent Vitals/Pain Pain Assessment: No/denies pain    Home Living Family/patient expects to be discharged to:: Private residence Living Arrangements: Spouse/significant other Available Help at Discharge: Family;Available 24 hours/day Type of Home: House (Home Garden home) Home Access: Level entry     Home Layout: One level Home Equipment: Cane - quad      Prior Function Level of Independence: Independent with assistive device(s)         Comments: Does not drive anymore. Wife does the driving  and they go grocery shopping together and exercise together often.     Hand Dominance   Dominant Hand: Right    Extremity/Trunk Assessment   Upper Extremity Assessment: Defer to OT evaluation           Lower Extremity Assessment: Generalized weakness (Likely age-appropriate strength deficits)      Cervical / Trunk Assessment: Kyphotic  Communication   Communication: No difficulties  Cognition  Arousal/Alertness: Awake/alert Behavior During Therapy: WFL for tasks assessed/performed Overall Cognitive Status: Within Functional Limits for tasks assessed                      General Comments      Exercises        Assessment/Plan    PT Assessment Patient needs continued PT services  PT Diagnosis Difficulty walking;Generalized weakness   PT Problem List Decreased strength;Decreased range of motion;Decreased activity tolerance;Decreased balance;Decreased mobility;Decreased knowledge of use of DME;Decreased safety awareness;Decreased knowledge of precautions  PT Treatment Interventions DME instruction;Gait training;Stair training;Functional mobility training;Therapeutic activities;Therapeutic exercise;Neuromuscular re-education;Patient/family education   PT Goals (Current goals can be found in the Care Plan section) Acute Rehab PT Goals Patient Stated Goal: Return home with wife PT Goal Formulation: With patient Time For Goal Achievement: 12/02/14 Potential to Achieve Goals: Good    Frequency Min 3X/week   Barriers to discharge        Co-evaluation               End of Session Equipment Utilized During Treatment: Gait belt Activity Tolerance: Patient tolerated treatment well Patient left: in chair;with call bell/phone within reach;with chair alarm set Nurse Communication: Mobility status    Functional Assessment Tool Used: Clinical judgement Functional Limitation: Mobility: Walking and moving around Mobility: Walking and Moving Around Current Status [phone]): At least 1 percent but less than 20 percent impaired, limited or restricted Mobility: Walking and Moving Around Goal Status [phone]): At least 1 percent but less than 20 percent impaired, limited or restricted    Time: 0817-0856 PT Time Calculation (min) (ACUTE ONLY): 39 min   Charges:   PT Evaluation $Initial PT Evaluation Tier I: 1 Procedure PT Treatments $Gait Training: 8-22  mins $Therapeutic Activity: 8-22 mins   PT G Codes:   PT G-Codes **NOT FOR INPATIENT CLASS** Functional Assessment Tool Used: Clinical judgement Functional Limitation: Mobility: Walking and moving around Mobility: Walking and Moving Around Current Status (D7412): At least 1 percent but less than 20 percent impaired, limited or restricted Mobility: Walking and Moving Around Goal Status [phone]): At least 1 percent but less than 20 percent impaired, limited or restricted    Rolinda Roan 11/25/2014, 9:02 AM   Rolinda Roan, PT, DPT Acute Rehabilitation Services Pager: [phone]

## 2014-11-25 NOTE — Progress Notes (Signed)
Barbra Sarks to be D/C'd Home per MD order.  Discussed with the patient and all questions fully answered.  VSS, Skin clean, dry and intact without evidence of skin break down, no evidence of skin tears noted. IV catheter discontinued intact. Site without signs and symptoms of complications. Dressing and pressure applied.  An After Visit Summary was printed and given to the patient. Patient received prescription.  D/c education completed with patient/family including follow up instructions, medication list, d/c activities limitations if indicated, with other d/c instructions as indicated by MD - patient able to verbalize understanding, all questions fully answered.   Patient instructed to return to ED, call 911, or call MD for any changes in condition.   Patient escorted via [place], and D/C home via private auto.  L'ESPERANCE, Dianara Smullen C 11/25/2014 4:45 PM

## 2014-11-25 NOTE — Evaluation (Signed)
Occupational Therapy Evaluation Patient Details Name: Gabriel Stewart MRN: [id number] DOB: [birth year] Today's Date: 11/25/2014    History of Present Illness Pt is a 81 y.o. male, with a PMH of Parkinson's, melanoma, hypertension and complete heart block (per his wife) presents to the ED after a syncopal episode earlier today. Gabriel Stewart is a poor historian but is able to relate that he was eating lunch when he got up to go to the bathroom. He felt dizzy on standing and was unable to walk to the bathroom. He lost consciousness and was gently lowered to the floor. The next thing he remembers is being in the ambulance. Gabriel Stewart gives a lifelong history of intermittent syncopal episodes.    Clinical Impression   PTA pt lived at home and was independent with use of a cane. Pt is currently at Supervision level but likely at or near baseline. No further acute OT needs.     Follow Up Recommendations  No OT follow up    Equipment Recommendations  None recommended by OT    Recommendations for Other Services       Precautions / Restrictions Precautions Precautions: Fall Restrictions Weight Bearing Restrictions: No      Mobility Bed Mobility Overal bed mobility: Modified Independent                Transfers Overall transfer level: Needs assistance Equipment used: Quad cane Transfers: Sit to/from [facility] Sit to Stand: Supervision Stand pivot transfers: Supervision       General transfer comment: Supervision for safety. Pt able to stand from EOB then pivot/ambulate over to recliner so OT could change his bed.          ADL Overall ADL's : Needs assistance/impaired                                       General ADL Comments: Pt requires supervision for ADLs and functional mobility but is likely at or near baseline.      Vision Additional Comments: No change from baseline.           Pertinent Vitals/Pain Pain Assessment:  No/denies pain     Hand Dominance Right   Extremity/Trunk Assessment Upper Extremity Assessment Upper Extremity Assessment: Generalized weakness   Lower Extremity Assessment Lower Extremity Assessment: Generalized weakness   Cervical / Trunk Assessment Cervical / Trunk Assessment: Kyphotic   Communication Communication Communication: No difficulties   Cognition Arousal/Alertness: Awake/alert Behavior During Therapy: WFL for tasks assessed/performed Overall Cognitive Status: Within Functional Limits for tasks assessed                                Home Living Family/patient expects to be discharged to:: Private residence Living Arrangements: Spouse/significant other Available Help at Discharge: Family;Available 24 hours/day Type of Home: House (Townhome) Home Access: Level entry     Home Layout: One level     Bathroom Shower/Tub: Occupational psychologist: Standard     Home Equipment: Cane - quad          Prior Functioning/Environment Level of Independence: Independent with assistive device(s)        Comments: Does not drive anymore. Wife does the driving and they go grocery shopping together and exercise together often.    OT Diagnosis: Generalized weakness    End of  Session Equipment Utilized During Treatment: Other (comment) [facility])  Activity Tolerance: Patient tolerated treatment well Patient left: in bed;with call bell/phone within reach;with family/visitor present   Time: 1410-1423 OT Time Calculation (min): 13 min Charges:  OT General Charges $OT Visit: 1 Procedure OT Evaluation $Initial OT Evaluation Tier I: 1 Procedure G-Codes: OT G-codes **NOT FOR INPATIENT CLASS** Functional Assessment Tool Used: clinical judgement Functional Limitation: Self care Self Care Current Status (X6147): At least 1 percent but less than 20 percent impaired, limited or restricted Self Care Goal Status (W9295): At least 1 percent but less than  20 percent impaired, limited or restricted Self Care Discharge Status [phone]): At least 1 percent but less than 20 percent impaired, limited or restricted  Gabriel Stewart 11/25/2014, 2:32 PM  Cyndie Chime, OTR/L Occupational Therapist [phone] (pager)

## 2014-12-02 ENCOUNTER — Ambulatory Visit (INDEPENDENT_AMBULATORY_CARE_PROVIDER_SITE_OTHER): Payer: Medicare PPO | Admitting: Diagnostic Neuroimaging

## 2014-12-02 ENCOUNTER — Encounter: Payer: Self-pay | Admitting: Diagnostic Neuroimaging

## 2014-12-02 VITALS — BP 107/55 | HR 80 | Ht 72.0 in | Wt 179.6 lb

## 2014-12-02 DIAGNOSIS — G20 Parkinson's disease: Secondary | ICD-10-CM | POA: Diagnosis not present

## 2014-12-02 DIAGNOSIS — G250 Essential tremor: Secondary | ICD-10-CM

## 2014-12-02 NOTE — Patient Instructions (Signed)
Continue medications

## 2014-12-02 NOTE — Progress Notes (Signed)
[facility]  PATIENT: Gabriel Stewart DOB: [birth year]  REFERRING CLINICIAN: Edwin Dada HISTORY FROM: patient and wife REASON FOR VISIT: follow up   HISTORICAL  CHIEF COMPLAINT:  Chief Complaint  Patient presents with  . Follow-up    HISTORY OF PRESENT ILLNESS:   UPDATE 12/01/13: Since last visit was doing well until last week (had syncope, due to orthostatic hypotension). Admitted to hosp, now back home. Parkinson's dz sxs stable.   UPDATE 06/29/14: Since last visit, fell once in [place], and in general having more confusion and memory problems. Having more prob with using the computer. Using a cane now. Trying to get back to PT. Some swallow diff.   PRIOR HPI (11/20/13): 81 year old ambidextrous male with hypertension, melanoma skin cancer, here for evaluation of Parkinson's disease. Patient has had essential tremor since 1970s, 1980s, with family history of essential tremor in his own father. Patient's essential tremor was mainly postural tremor, mild action tremor, but did not affect him throughout his life significantly. He was never on medication for essential tremor. 2008 patient developed a new kind of tremor in his left upper extremity, at rest and with action. Handwriting deteriorated. Patient was evaluated and diagnosed with Parkinson's disease. 2 started on carbidopa/levodopa with improvement in tremor control. She's been managed at [facility] for the past year after moving to [place]. Now wants to establish with local neurologist. Patient has been on carbidopa/levodopa 25/100, 2 tablets 3 times per day. Patient followed up with primary care physician, and possibly due to miscommunication, started taking 2 tablets 4 times a day several days ago. No benefit or adverse effects. Patient was doing quite well on 2 tablets 3 times a day. No on a fluctuations. No wearing off. Patient having some balance difficulty shuffling gait. No falls recently. She's  having difficulty swallowing. He has some saliva control issues. Hoarse and soft voice is notable. He has decreased sense of smell and taste. No REM behavior sleep disorder symptoms. He has mild to moderate constipation.  REVIEW OF SYSTEMS: Full 14 system review of systems performed and notable only for memory loss slurred speech difficulty swallowing tremor decreased energy running nose constipation trouble swallowing melanoma weight loss confusion choking.    ALLERGIES: Allergies  Allergen Reactions  . Novocain [Procaine]     syncope    HOME MEDICATIONS: Outpatient Prescriptions Prior to Visit  Medication Sig Dispense Refill  . aspirin 325 MG tablet Take 325 mg by mouth daily.    Marland Kitchen b complex vitamins capsule Take 1 capsule by mouth daily.    . carbidopa-levodopa (SINEMET IR) 25-100 MG per tablet Take 2 tablets by mouth 3 (three) times daily. 180 tablet 12  . Multiple Vitamins-Minerals (CENTRUM ADULTS) TABS Take 1 tablet by mouth daily.    . vitamin B-12 (CYANOCOBALAMIN) 1000 MCG tablet Take 1,000 mcg by mouth daily.    . Dutasteride-Tamsulosin HCl 0.5-0.4 MG CAPS Take by mouth. Once daily    . vitamin E 100 UNIT capsule Take 100 Units by mouth daily.     No facility-administered medications prior to visit.    PAST MEDICAL HISTORY: Past Medical History  Diagnosis Date  . Hypertension   . Parkinson's disease   . Kidney stone   . Melanoma 2010    "back"  . Lyme disease [phone]  . Pneumonia     "as a kid"  . Cardiac arrest 1970's    "did CPR & revived him"   . Dizzy spells     "  frequent"  . Syncope and collapse 11/24/2014    PAST SURGICAL HISTORY: Past Surgical History  Procedure Laterality Date  . Cataract extraction w/ intraocular lens  implant, bilateral Bilateral 1970  . Circumcision  2015  . Bladder stone removal  2015  . Cystoscopy w/ stone manipulation      "never got the stone though"  . Melanoma excision  2010    "back"  . Temporal artery biopsy /  ligation  [id number]    "before dx'd w/Lyme's disease"    FAMILY HISTORY: Family History  Problem Relation Age of Onset  . Appendicitis Brother   . Pneumonia Brother     SOCIAL HISTORY:  History   Social History  . Marital Status: Married    Spouse Name: [name]  . Number of Children: 3  . Years of Education: 16   Occupational History  .      retired   Social History Main Topics  . Smoking status: Former Smoker -- 1.00 packs/day for 5 years    Types: Cigarettes  . Smokeless tobacco: Never Used     Comment: quit in 1960  . Alcohol Use: 4.2 oz/week    7 Shots of liquor per week     Comment: 1 shots bourbon daily  . Drug Use: No  . Sexual Activity: Not Currently   Other Topics Concern  . Not on file   Social History Narrative   Patient resides with wife, can write with hands     PHYSICAL EXAM  Filed Vitals:   12/02/14 1330  BP: 107/55  Pulse: 80  Height: 6' (1.829 m)  Weight: 179 lb 9.6 oz (81.466 kg)    Not recorded      Body mass index is 24.35 kg/(m^2).  GENERAL EXAM: Patient is in no distress; well developed, nourished and groomed; neck is supple  CARDIOVASCULAR: Regular rate and rhythm, no murmurs, no carotid bruits  NEUROLOGIC: MENTAL STATUS: awake, alert, language fluent, comprehension intact, naming intact, fund of knowledge appropriate CRANIAL NERVE: pupils equal and reactive to light, visual fields full to confrontation, extraocular muscles intact, no nystagmus, facial sensation and strength symmetric, hearing intact, palate elevates symmetrically, uvula midline, shoulder shrug symmetric, tongue midline. HOARSE VOICE. SOFT VOICE. MOTOR: normal bulk and tone, MINIMAL POSTURAL TREMOR. NO RIGIDITY. MILD BRADYKINESIA IN LUE AND LLE. Full strength in the BUE, BLE SENSORY: normal and symmetric to light touch; DECR VIB AT TOES (<5SEC). COORDINATION: finger-nose-finger, fine finger movements normal REFLEXES: BUE TRACE, KNEES TRACE, ANKLES  0 GAIT/STATION: SLOW TO RISE, SHORT STEPS, MILD EN BLOC TURNING.     DIAGNOSTIC DATA (LABS, IMAGING, TESTING) - I reviewed patient records, labs, notes, testing and imaging myself where available.  Lab Results  Component Value Date   WBC 9.9 11/24/2014      Component Value Date/Time   NA 142 11/24/2014 1012   No results found for: CHOL No results found for: HGBA1C No results found for: VITAMINB12 Lab Results  Component Value Date   TSH 1.220 11/24/2014      ASSESSMENT AND PLAN  81 y.o. year old male here with long history of essential tremor, with new-onset Parkinson's disease around 2008. On carbidopa/levodopa, with progression of confusion and gait difficulty.   Dx: parkinson's disease (akinetic/rigid) + essential tremor  PLAN: - CONTINUE carb/levo (25/100) 2 tabs TID  - use cane / walker for balance  Orders Placed This Encounter  Procedures  . For home use only DME 4 wheeled rolling walker with seat  Return in about 6 months (around 06/04/2015).  I spent 25 minutes of face to face time with patient. Greater than 50% of time was spent in counseling and coordination of care with patient.    Penni Bombard, MD 7/47/3403, 7:09 PM Certified in Neurology, Neurophysiology and Neuroimaging  [facility] [address], [place], [place] [address] [phone]

## 2014-12-21 ENCOUNTER — Telehealth: Payer: Self-pay | Admitting: Diagnostic Neuroimaging

## 2014-12-21 NOTE — Telephone Encounter (Signed)
Spoke with Raquel Sarna on the phone and gave her the ICD 10 code for Parkinsons disease and essential tremor. She thanked me for calling

## 2014-12-21 NOTE — Telephone Encounter (Signed)
Gabriel Stewart with [facility] [phone]) needs a diagnosis code for the patients walker. Please call and advise.

## 2015-05-13 ENCOUNTER — Encounter: Payer: Self-pay | Admitting: *Deleted

## 2015-05-16 ENCOUNTER — Encounter: Payer: Self-pay | Admitting: Adult Health

## 2015-05-16 ENCOUNTER — Non-Acute Institutional Stay (SKILLED_NURSING_FACILITY): Payer: Medicare PPO | Admitting: Adult Health

## 2015-05-16 DIAGNOSIS — R1310 Dysphagia, unspecified: Secondary | ICD-10-CM | POA: Diagnosis not present

## 2015-05-16 DIAGNOSIS — G20 Parkinson's disease: Secondary | ICD-10-CM | POA: Diagnosis not present

## 2015-05-16 DIAGNOSIS — N400 Enlarged prostate without lower urinary tract symptoms: Secondary | ICD-10-CM

## 2015-05-16 DIAGNOSIS — I6200 Nontraumatic subdural hemorrhage, unspecified: Secondary | ICD-10-CM | POA: Diagnosis not present

## 2015-05-16 DIAGNOSIS — I10 Essential (primary) hypertension: Secondary | ICD-10-CM

## 2015-05-16 DIAGNOSIS — S065X9A Traumatic subdural hemorrhage with loss of consciousness of unspecified duration, initial encounter: Secondary | ICD-10-CM

## 2015-05-16 DIAGNOSIS — S065XAA Traumatic subdural hemorrhage with loss of consciousness status unknown, initial encounter: Secondary | ICD-10-CM

## 2015-05-17 DIAGNOSIS — I10 Essential (primary) hypertension: Secondary | ICD-10-CM | POA: Insufficient documentation

## 2015-05-17 DIAGNOSIS — N400 Enlarged prostate without lower urinary tract symptoms: Secondary | ICD-10-CM | POA: Insufficient documentation

## 2015-05-17 DIAGNOSIS — S065X9A Traumatic subdural hemorrhage with loss of consciousness of unspecified duration, initial encounter: Secondary | ICD-10-CM | POA: Insufficient documentation

## 2015-05-17 DIAGNOSIS — S065XAA Traumatic subdural hemorrhage with loss of consciousness status unknown, initial encounter: Secondary | ICD-10-CM | POA: Insufficient documentation

## 2015-05-17 DIAGNOSIS — R1310 Dysphagia, unspecified: Secondary | ICD-10-CM | POA: Insufficient documentation

## 2015-05-17 LAB — BASIC METABOLIC PANEL
BUN: 27 mg/dL — AB (ref 4–21)
Creatinine: 0.8 mg/dL (ref 0.6–1.3)
GLUCOSE: 100 mg/dL
Potassium: 4.8 mmol/L (ref 3.4–5.3)
Sodium: 138 mmol/L (ref 137–147)

## 2015-05-17 LAB — HEPATIC FUNCTION PANEL
ALT: 19 U/L (ref 10–40)
AST: 18 U/L (ref 14–40)
Alkaline Phosphatase: 82 U/L (ref 25–125)
BILIRUBIN, TOTAL: 0.6 mg/dL

## 2015-05-17 LAB — CBC AND DIFFERENTIAL
HEMATOCRIT: 32 % — AB (ref 41–53)
HEMOGLOBIN: 10.2 g/dL — AB (ref 13.5–17.5)
PLATELETS: 249 10*3/uL (ref 150–399)
WBC: 7 10*3/mL

## 2015-05-17 NOTE — Progress Notes (Signed)
Patient ID: Gabriel Stewart, male   DOB: [birth year], 81 y.o.   MRN: [id number]    DATE:  05/16/15   MRN:  [id number]  BIRTHDAY: 1922/01/04  Facility:  [facility]ing Home Location:  [place] Room Number: 808-P  LEVEL OF CARE:  SNF (31)  Contact Information    Name Relation Home Work [place] Spouse   [phone]   Mica, Releford   [phone]   Mak, Bonny Relative   [phone]      Chief Complaint  Patient presents with  . Hospitalization Follow-up    Subdural hematoma S/P craniotomy and drainage, Parkinson's disease, hypertension, BPH and dysphagia    HISTORY OF PRESENT ILLNESS:  This is a 81 year old male who has been admitted to [facility] on 05/14/15. He was visiting [place] when he had a fall while trying to sit down on his walker at home. He then started to develop difficulty swallowing. He has difficulty swallowing at baseline but it isignificantly increased. Workup at [facility] revealed a moderate size right and small left extra-axial subdural hematoma which were consistent with acute on chronic bleed. The left subdural hematoma is much larger than the right and produces mass effect and midline shift. He was then transferred to [facility] wherein he had craniotomy and drainage on 04/01/2015. He was transferred to [facility] for rehabilitation. He then transferred to [facility] for further rehabilitation.   PAST MEDICAL HISTORY:  Past Medical History  Diagnosis Date  . Hypertension   . Parkinson's disease ([place])   . Kidney stone   . Melanoma ([place]) 2010    "back"  . Lyme disease [phone]  . Pneumonia     "as a kid"  . Cardiac arrest [facility]) 1970's    "did CPR & revived him"   . Dizzy spells     "frequent"  . Syncope and collapse 11/24/2014     CURRENT MEDICATIONS: Reviewed     Medication List       This list is accurate as of: 05/16/15 11:59 PM.  Always use your most  recent med list.               b complex vitamins capsule  1 capsule by PEG Tube route daily.     carbidopa-levodopa 25-100 MG tablet  Commonly known as:  SINEMET IR  Take 2 tablets by mouth 3 (three) times daily.     cholecalciferol 1000 UNITS tablet  Commonly known as:  VITAMIN D  1,000 Units by PEG Tube route daily.     doxazosin 1 MG tablet  Commonly known as:  CARDURA  1 mg by PEG Tube route daily.     lisinopril 10 MG tablet  Commonly known as:  PRINIVIL,ZESTRIL  Take 1 tablet by mouth daily.     vitamin B-12 1000 MCG tablet  Commonly known as:  CYANOCOBALAMIN  1,000 mcg by PEG Tube route daily.         Allergies  Allergen Reactions  . Novocain [Procaine]     syncope     REVIEW OF SYSTEMS:  GENERAL: no change in appetite, no fatigue, no weight changes, no fever, chills or weakness EYES: Denies change in vision, dry eyes, eye pain, itching or discharge EARS: Denies change in hearing, ringing in ears, or earache NOSE: Denies nasal congestion or epistaxis MOUTH and THROAT: Denies gingival pain or bleeding, pain from teeth or hoarseness   RESPIRATORY: no cough, SOB,  DOE, wheezing, hemoptysis CARDIAC: no chest pain, edema or palpitations GI: no abdominal pain, diarrhea, constipation, heart burn, nausea or vomiting GU: Denies dysuria, frequency, hematuria, incontinence, or discharge PSYCHIATRIC: Denies feeling of depression or anxiety. No report of hallucinations, insomnia, paranoia, or agitation   PHYSICAL EXAMINATION  GENERAL APPEARANCE: Well nourished. In no acute distress. Normal body habitus SKIN:  Scalp with small steri-strip HEAD: Normal in size and contour. No evidence of trauma EYES: Lids open and close normally. No blepharitis, entropion or ectropion. PERRL. Conjunctivae are clear and sclerae are white. Lenses are without opacity EARS: Pinnae are normal. Patient hears normal voice tunes of the examiner MOUTH and THROAT: Lips are without lesions.  Oral mucosa is moist and without lesions. Tongue is normal in shape, size, and color and without lesions NECK: supple, trachea midline, no neck masses, no thyroid tenderness, no thyromegaly LYMPHATICS: no LAN in the neck, no supraclavicular LAN RESPIRATORY: breathing is even & unlabored, BS CTAB CARDIAC: RRR, no murmur,no extra heart sounds, no edema GI: abdomen soft, normal BS, no masses, no tenderness, no hepatomegaly, no splenomegaly, +peg tube EXTREMITIES:  Able to move 4 extremities PSYCHIATRIC: Alert and oriented X 3. Affect and behavior are appropriate  LABS/RADIOLOGY: Labs reviewed: Basic Metabolic Panel:  Recent Labs  11/24/14 0935 11/24/14 1012  NA 140 142  K 3.7 3.6  CL 108 105  CO2 24  --   GLUCOSE 134* 131*  BUN 22* 24*  CREATININE 1.00 0.90  CALCIUM 8.5*  --    CBC:  Recent Labs  11/24/14 0935 11/24/14 1012  WBC 9.9  --   HGB 10.9* 11.9*  HCT 32.8* 35.0*  MCV 92.9  --   PLT 210  --     Cardiac Enzymes:  Recent Labs  11/24/14 1630 11/24/14 2209 11/25/14 0425  TROPONINI <0.03 <0.03 <0.03   CBG:  Recent Labs  11/25/14 0422 11/25/14 0803  GLUCAP 84 88    ASSESSMENT/PLAN:  Subdural hematoma S/P craniotomy and drainage - for rehabilitation; check CBC   Dysphagia - continue PEG tube feeding with Jevity 1.5 bolus feeding one can via PEG tube 5 times/day and nectar thick liquids with medpass  BPH - continue Doxazosin 1 mg via PEG daily  Parkinson's disease  - continue Sinemet 25-100 mg 2 tabs via PEG 3 times a day   Hypertension - continue lisinopril 10 mg 1 tab via PEG daily; check CMP    Goals of care:  Short-term rehabilitation    [facility], [facility] [phone]

## 2015-05-18 ENCOUNTER — Non-Acute Institutional Stay (SKILLED_NURSING_FACILITY): Payer: Medicare PPO | Admitting: Internal Medicine

## 2015-05-18 DIAGNOSIS — R1310 Dysphagia, unspecified: Secondary | ICD-10-CM | POA: Diagnosis not present

## 2015-05-18 DIAGNOSIS — D62 Acute posthemorrhagic anemia: Secondary | ICD-10-CM

## 2015-05-18 DIAGNOSIS — I6200 Nontraumatic subdural hemorrhage, unspecified: Secondary | ICD-10-CM

## 2015-05-18 DIAGNOSIS — S065XAA Traumatic subdural hemorrhage with loss of consciousness status unknown, initial encounter: Secondary | ICD-10-CM

## 2015-05-18 DIAGNOSIS — S065X9A Traumatic subdural hemorrhage with loss of consciousness of unspecified duration, initial encounter: Secondary | ICD-10-CM

## 2015-05-18 DIAGNOSIS — G20 Parkinson's disease: Secondary | ICD-10-CM | POA: Diagnosis not present

## 2015-05-18 DIAGNOSIS — N400 Enlarged prostate without lower urinary tract symptoms: Secondary | ICD-10-CM | POA: Diagnosis not present

## 2015-05-18 DIAGNOSIS — I10 Essential (primary) hypertension: Secondary | ICD-10-CM

## 2015-05-18 DIAGNOSIS — R490 Dysphonia: Secondary | ICD-10-CM | POA: Diagnosis not present

## 2015-05-18 DIAGNOSIS — R5381 Other malaise: Secondary | ICD-10-CM

## 2015-05-18 NOTE — Progress Notes (Signed)
Patient ID: Gabriel Stewart, male   DOB: 1922-07-20, 81 y.o.   MRN: [id number]       [facility]  PCP: Gerrit Heck, MD  Code Status: Full Code  Allergies  Allergen Reactions  . Novocain [Procaine]     syncope    Chief Complaint  Patient presents with  . New Admit To SNF    New Admission     HPI:  81 y.o. patient is here for short term rehabilitation post hospital admission from 03/30/15-04/22/15 in [place] where he was visiting post fall with subdural hematoma. He underwent craniotomy and with worsening dysphagia underwent peg tube placement. He was then at rehabilitation centre there from 04/22/15- 05/14/15 when he was transferred to our facility for rehabilitation. He is seen in his room today with his wife present. He is alert and oriented. He has some hoarseness of voice and underwent FEES study today. He was noted to have vocal cord swelling and limited adduction. He feels to be working good with therapy team and getting his strength back. His appetite is picking up. He had a bowel movement today. No falls in the facility. He has PMH of parkinson's disease, HTN, BPH.   Review of Systems:  Constitutional: Negative for fever, chills, diaphoresis.  HENT: Negative for headache, congestion, nasal discharge Eyes: Negative for eye pain, blurred vision, double vision and discharge.  Respiratory: Negative for cough, shortness of breath and wheezing.   Cardiovascular: Negative for chest pain, palpitations, leg swelling.  Gastrointestinal: Negative for heartburn, nausea, vomiting, abdominal pain Genitourinary: Negative for dysuria. Positive for urgency with his BPH Musculoskeletal: Negative for back pain Skin: Negative for itching, rash.  Neurological: Negative for dizziness, tingling, focal weakness Psychiatric/Behavioral: Negative for depression   Past Medical History  Diagnosis Date  . Hypertension   . Parkinson's disease ([place])   . Kidney stone   .  Melanoma ([place]) 2010    "back"  . Lyme disease [phone]  . Pneumonia     "as a kid"  . Cardiac arrest [facility]) 1970's    "did CPR & revived him"   . Dizzy spells     "frequent"  . Syncope and collapse 11/24/2014   Past Surgical History  Procedure Laterality Date  . Cataract extraction w/ intraocular lens  implant, bilateral Bilateral 1970  . Circumcision  2015  . Bladder stone removal  2015  . Cystoscopy w/ stone manipulation      "never got the stone though"  . Melanoma excision  2010    "back"  . Temporal artery biopsy / ligation  [id number]    "before dx'd w/Lyme's disease"   Social History:   reports that he has quit smoking. His smoking use included Cigarettes. He has a 5 pack-year smoking history. He has never used smokeless tobacco. He reports that he drinks about 4.2 oz of alcohol per week. He reports that he does not use illicit drugs.  Family History  Problem Relation Age of Onset  . Appendicitis Brother   . Pneumonia Brother     Medications:   Medication List       This list is accurate as of: 05/18/15 12:06 PM.  Always use your most recent med list.               b complex vitamins capsule  1 capsule by PEG Tube route daily.     carbidopa-levodopa 25-100 MG tablet  Commonly known as:  SINEMET IR  Take 2 tablets by mouth  3 (three) times daily.     cholecalciferol 1000 UNITS tablet  Commonly known as:  VITAMIN D  Give two tablets via G tube daily for supplement     doxazosin 1 MG tablet  Commonly known as:  CARDURA  Give one tablet via G tube daily for BPH     feeding supplement (JEVITY 1.5 CAL) Liqd  Give one can via peg tube 5x's/day flush with 31mls H2O pre&post feeding     lisinopril 10 MG tablet  Commonly known as:  PRINIVIL,ZESTRIL  Take one tablet via G tube daily for hypertension     vitamin B-12 1000 MCG tablet  Commonly known as:  CYANOCOBALAMIN  1,000 mcg by PEG Tube route daily.         Physical Exam: Filed Vitals:    05/18/15 1143  BP: 126/86  Pulse: 74  Temp: 97.6 F (36.4 C)  TempSrc: Oral  Resp: 22  Height: 6\' 1"  (1.854 m)  Weight: 167 lb (75.751 kg)  SpO2: 96%    General- elderly male, thin built, in no acute distress Head- normocephalic, atraumatic Nose- normal nasal mucosa, no maxillary or frontal sinus tenderness, no nasal discharge Throat- moist mucus membrane Eyes- PERRLA, EOMI, no pallor, no icterus, no discharge, normal conjunctiva, normal sclera Neck- no cervical lymphadenopathy Cardiovascular- normal s1,s2, no murmurs, no leg edema Respiratory- bilateral clear to auscultation, no wheeze, no rhonchi, no crackles, no use of accessory muscles Abdomen- bowel sounds present, soft, non tender, peg tube in place with site clean Musculoskeletal- able to move all 4 extremities, generalized weakness and unsteady gait Neurological- alert and oriented to person, place and time Skin- warm and dry, steri strips on scalp Psychiatry- normal mood and affect    Labs reviewed: Basic Metabolic Panel:  Recent Labs  11/24/14 0935 11/24/14 1012 05/17/15  NA 140 142 138  K 3.7 3.6 4.8  CL 108 105  --   CO2 24  --   --   GLUCOSE 134* 131*  --   BUN 22* 24* 27*  CREATININE 1.00 0.90 0.8  CALCIUM 8.5*  --   --    Liver Function Tests:  Recent Labs  05/17/15  AST 18  ALT 19  ALKPHOS 82   No results for input(s): LIPASE, AMYLASE in the last 8760 hours. No results for input(s): AMMONIA in the last 8760 hours. CBC:  Recent Labs  11/24/14 0935 11/24/14 1012 05/17/15  WBC 9.9  --  7.0  HGB 10.9* 11.9* 10.2*  HCT 32.8* 35.0* 32*  MCV 92.9  --   --   PLT 210  --  249   Cardiac Enzymes:  Recent Labs  11/24/14 1630 11/24/14 2209 11/25/14 0425  TROPONINI <0.03 <0.03 <0.03   BNP: Invalid input(s): POCBNP CBG:  Recent Labs  11/25/14 0422 11/25/14 0803  GLUCAP 84 88    Radiological Exams: X-ray Chest Pa And Lateral  11/24/2014  CLINICAL DATA:  Syncopal episode today.  Weakness. Initial encounter. EXAM: CHEST  2 VIEW COMPARISON:  10/29/2014 and 07/28/2013. FINDINGS: The heart size and mediastinal contours are normal. The lungs are clear. There is no pleural effusion or pneumothorax. No acute osseous findings are identified. Mild thoracic spine degenerative changes noted. IMPRESSION: Stable chest.  No acute cardiopulmonary process. Electronically Signed   By: Richardean Sale M.D.   On: 11/24/2014 18:10     Assessment/Plan  Physical deconditioning With his parkinson's disease and recent subdural hematoma. Will have him work with physical therapy and occupational therapy  team to help with gait training and muscle strengthening exercises.fall precautions. Skin care. Encourage to be out of bed.   Subdural hematoma  S/P craniotomy and drainage. Hb low but stable. Monitor h&h. Continue craniotomy surgical site skin care  Dysphagia With his parkinson's disease and possible intubation. continue PEG tube feeding with Jevity 1.5 bolus feeding one can via PEG tube 5 times/day and nectar thick liquids with medpass. Followed by SLP and underwent FEES study- pending result  Hoarseness With vocal cord swelling- ENT referral to be provided  Blood loss anemia Post op, monitor h&h  BPH continue Doxazosin 1 mg daily  Parkinson's disease continue Sinemet 25-100 mg 2 tabs tid, fall precautions  Hypertension Stable, continue lisinopril 10 mg daily and monitor bp  Protein calorie malnutrition Will have SLP and dietary team work with him, monitor weight weekly for now   Goals of care: short term rehabilitation   Labs/tests ordered: cbc, bmp 1 week  Family/ staff Communication: reviewed care plan with patient and nursing supervisor    Blanchie Serve, MD  [facility] [phone] (Monday-Friday 8 am - 5 pm) [phone] (afterhours)

## 2015-05-26 ENCOUNTER — Encounter: Payer: Self-pay | Admitting: Adult Health

## 2015-05-26 ENCOUNTER — Non-Acute Institutional Stay (SKILLED_NURSING_FACILITY): Payer: Medicare PPO | Admitting: Adult Health

## 2015-05-26 DIAGNOSIS — S065XAA Traumatic subdural hemorrhage with loss of consciousness status unknown, initial encounter: Secondary | ICD-10-CM

## 2015-05-26 DIAGNOSIS — I10 Essential (primary) hypertension: Secondary | ICD-10-CM | POA: Diagnosis not present

## 2015-05-26 DIAGNOSIS — N400 Enlarged prostate without lower urinary tract symptoms: Secondary | ICD-10-CM | POA: Diagnosis not present

## 2015-05-26 DIAGNOSIS — I6200 Nontraumatic subdural hemorrhage, unspecified: Secondary | ICD-10-CM | POA: Diagnosis not present

## 2015-05-26 DIAGNOSIS — G20 Parkinson's disease: Secondary | ICD-10-CM | POA: Diagnosis not present

## 2015-05-26 DIAGNOSIS — R1310 Dysphagia, unspecified: Secondary | ICD-10-CM | POA: Diagnosis not present

## 2015-05-26 DIAGNOSIS — D62 Acute posthemorrhagic anemia: Secondary | ICD-10-CM | POA: Diagnosis not present

## 2015-05-26 DIAGNOSIS — S065X9A Traumatic subdural hemorrhage with loss of consciousness of unspecified duration, initial encounter: Secondary | ICD-10-CM

## 2015-05-31 NOTE — Progress Notes (Signed)
Patient ID: Gabriel Stewart, male   DOB: 12/11/1921, 81 y.o.   MRN: [id number]    DATE:  05/26/15  MRN:  [id number]  BIRTHDAY: [birth year]  Facility:  Nursing Home Location:  [place] Room Number: 808-P  LEVEL OF CARE:  SNF [phone])      Contact Information    Name Relation Home Work [place]  [phone]   Gabriel Stewart   [phone]   Gabriel Stewart Relative   [phone]      Chief Complaint  Patient presents with  . Discharge Note    Subdural hematoma S/P craniotomy and drainage, Parkinson's disease, hypertension, BPH and dysphagia    HISTORY OF PRESENT ILLNESS:  This is a 81 year old male who is for discharge home with OT for ADLs, PT for strengthening and endurance, speech therapist for swallowing and skilled nurse for teaching tube care. DME: Rolling walker and 3 in 1 bedside commode. He has been admitted to [facility] on 05/14/15. He was visiting [place] when he had a fall while trying to sit down on his walker at home. He then started to develop difficulty swallowing. He has difficulty swallowing at baseline but it isignificantly increased. Workup at [facility] revealed a moderate size right and small left extra-axial subdural hematoma which were consistent with acute on chronic bleed. The left subdural hematoma is much larger than the right and produces mass effect and midline shift. He was then transferred to [facility] wherein he had craniotomy and drainage on 04/01/2015. He was transferred to [facility]. He then transferred to [facility] for further rehabilitation.  Patient was admitted to this facility for short-term rehabilitation after the patient's recent hospitalization.  Patient has completed SNF rehabilitation and therapy has cleared the patient for discharge.   PAST MEDICAL HISTORY:  Past Medical History  Diagnosis Date  .  Hypertension   . Parkinson's disease ([place])   . Kidney stone   . Melanoma ([place]) 2010    "back"  . Lyme disease [phone]  . Pneumonia     "as a kid"  . Cardiac arrest [facility]) 1970's    "did CPR & revived him"   . Dizzy spells     "frequent"  . Syncope and collapse 11/24/2014     CURRENT MEDICATIONS: Reviewed     Medication List       This list is accurate as of: 05/26/15 11:59 PM.  Always use your most recent med list.               b complex vitamins capsule  1 capsule by PEG Tube route daily.     carbidopa-levodopa 25-100 MG tablet  Commonly known as:  SINEMET IR  Take 2 tablets by mouth 3 (three) times daily.     cholecalciferol 1000 UNITS tablet  Commonly known as:  VITAMIN D  Give two tablets via G tube daily for supplement     doxazosin 1 MG tablet  Commonly known as:  CARDURA  Give one tablet via G tube daily for BPH     feeding supplement (JEVITY 1.5 CAL) Liqd  Give one can via peg tube 5x's/day flush with 19mls H2O pre&post feeding     lisinopril 10 MG tablet  Commonly known as:  PRINIVIL,ZESTRIL  Take one tablet via G tube daily for hypertension     vitamin B-12 1000 MCG tablet  Commonly known as:  CYANOCOBALAMIN  1,000  mcg by PEG Tube route daily.         Allergies  Allergen Reactions  . Novocain [Procaine]     syncope     REVIEW OF SYSTEMS:  GENERAL: no change in appetite, no fatigue, no weight changes, no fever, chills or weakness EYES: Denies change in vision, dry eyes, eye pain, itching or discharge EARS: Denies change in hearing, ringing in ears, or earache NOSE: Denies nasal congestion or epistaxis MOUTH and THROAT: Denies gingival pain or bleeding, pain from teeth or hoarseness   RESPIRATORY: no cough, SOB, DOE, wheezing, hemoptysis CARDIAC: no chest pain, edema or palpitations GI: no abdominal pain, diarrhea, constipation, heart burn, nausea or vomiting GU: Denies dysuria, frequency, hematuria, incontinence, or  discharge PSYCHIATRIC: Denies feeling of depression or anxiety. No report of hallucinations, insomnia, paranoia, or agitation   PHYSICAL EXAMINATION  GENERAL APPEARANCE: Well nourished. In no acute distress. Normal body habitus HEAD: Normal in size and contour. No evidence of trauma EYES: Lids open and close normally. No blepharitis, entropion or ectropion. PERRL. Conjunctivae are clear and sclerae are white. Lenses are without opacity EARS: Pinnae are normal. Patient hears normal voice tunes of the examiner MOUTH and THROAT: Lips are without lesions. Oral mucosa is moist and without lesions. Tongue is normal in shape, size, and color and without lesions NECK: supple, trachea midline, no neck masses, no thyroid tenderness, no thyromegaly LYMPHATICS: no LAN in the neck, no supraclavicular LAN RESPIRATORY: breathing is even & unlabored, BS CTAB CARDIAC: RRR, no murmur,no extra heart sounds, no edema GI: abdomen soft, normal BS, no masses, no tenderness, no hepatomegaly, no splenomegaly, +peg tube EXTREMITIES:  Able to move 4 extremities PSYCHIATRIC: Alert and oriented X 3. Affect and behavior are appropriate  LABS/RADIOLOGY: Labs reviewed: 05/25/15  WBC 6.8 hemoglobin 9.9 hematocrit 31.0 MCV 94.5 platelet 244 sodium 140 potassium 4.5 glucose 95 BUN 16 creatinine 0.74 calcium 9.2 Basic Metabolic Panel:  Recent Labs  11/24/14 0935 11/24/14 1012 05/17/15  NA 140 142 138  K 3.7 3.6 4.8  CL 108 105  --   CO2 24  --   --   GLUCOSE 134* 131*  --   BUN 22* 24* 27*  CREATININE 1.00 0.90 0.8  CALCIUM 8.5*  --   --    CBC:  Recent Labs  11/24/14 0935 11/24/14 1012 05/17/15  WBC 9.9  --  7.0  HGB 10.9* 11.9* 10.2*  HCT 32.8* 35.0* 32*  MCV 92.9  --   --   PLT 210  --  249    Cardiac Enzymes:  Recent Labs  11/24/14 1630 11/24/14 2209 11/25/14 0425  TROPONINI <0.03 <0.03 <0.03   CBG:  Recent Labs  11/25/14 0422 11/25/14 0803  GLUCAP 84 88     ASSESSMENT/PLAN:  Subdural hematoma S/P craniotomy and drainage - for home health PT, OT, ST and skilled Nurse   Dysphagia - diet has been changed to mechanical soft with nectar thick liquids    BPH - continue Doxazosin 1 mg via PEG daily  Parkinson's disease  - continue Sinemet 25-100 mg 2 tabs via PEG 3 times a day   Hypertension - continue lisinopril 10 mg 1 tab via PEG daily  Acute blood loss anemia - hemoglobin 9.9; stable     I have filled out patient's discharge paperwork and written prescriptions.  Patient will receive home health PT, OT, ST and skilled Nurse.  DME provided:  Rolling walker and 3 in 1 bedside commode  Total discharge  time: Greater than 30 minutes  Discharge time involved coordination of the discharge process with Education officer, museum, nursing staff and therapy department. Medical justification for home health services/DME verified.     [facility], NP [employer] [phone]

## 2015-06-03 ENCOUNTER — Other Ambulatory Visit: Payer: Self-pay | Admitting: Adult Health

## 2015-06-06 ENCOUNTER — Encounter: Payer: Self-pay | Admitting: Diagnostic Neuroimaging

## 2015-06-06 ENCOUNTER — Ambulatory Visit (INDEPENDENT_AMBULATORY_CARE_PROVIDER_SITE_OTHER): Payer: Medicare PPO | Admitting: Diagnostic Neuroimaging

## 2015-06-06 VITALS — BP 75/45 | HR 83 | Ht 72.0 in | Wt 176.2 lb

## 2015-06-06 DIAGNOSIS — G20 Parkinson's disease: Secondary | ICD-10-CM | POA: Diagnosis not present

## 2015-06-06 NOTE — Progress Notes (Signed)
[facility]  PATIENT: Gabriel Stewart DOB: [birth year]  REFERRING CLINICIAN: Edwin Dada HISTORY FROM: patient and wife REASON FOR VISIT: follow up   HISTORICAL  CHIEF COMPLAINT:  Chief Complaint  Patient presents with  . Parkinson's disease    rm 7, wife - [name]  . Follow-up    6 month    HISTORY OF PRESENT ILLNESS:   UPDATE 06/06/15: Since last visit, had fall in [place] leading to subdural hematoma, burr hole drainage, SNF recovery, and now back home since last week. Still with drooling, low BP and balance diff. Has ST and PT at home.   UPDATE 12/01/13: Since last visit was doing well until last week (had syncope, due to orthostatic hypotension). Admitted to hosp, now back home. Parkinson's dz sxs stable.   UPDATE 06/29/14: Since last visit, fell once in [place], and in general having more confusion and memory problems. Having more prob with using the computer. Using a cane now. Trying to get back to PT. Some swallow diff.   PRIOR HPI (11/20/13): 81 year old ambidextrous male with hypertension, melanoma skin cancer, here for evaluation of Parkinson's disease. Patient has had essential tremor since 1970s, 1980s, with family history of essential tremor in his own father. Patient's essential tremor was mainly postural tremor, mild action tremor, but did not affect him throughout his life significantly. He was never on medication for essential tremor. 2008 patient developed a new kind of tremor in his left upper extremity, at rest and with action. Handwriting deteriorated. Patient was evaluated and diagnosed with Parkinson's disease. 2 started on carbidopa/levodopa with improvement in tremor control. She's been managed at [facility] for the past year after moving to [place]. Now wants to establish with local neurologist. Patient has been on carbidopa/levodopa 25/100, 2 tablets 3 times per day. Patient followed up with primary care physician, and possibly due to  miscommunication, started taking 2 tablets 4 times a day several days ago. No benefit or adverse effects. Patient was doing quite well on 2 tablets 3 times a day. No on a fluctuations. No wearing off. Patient having some balance difficulty shuffling gait. No falls recently. She's having difficulty swallowing. He has some saliva control issues. Hoarse and soft voice is notable. He has decreased sense of smell and taste. No REM behavior sleep disorder symptoms. He has mild to moderate constipation.  REVIEW OF SYSTEMS: Full 14 system review of systems performed and notable only for memory loss slurred speech difficulty swallowing tremor decreased energy running nose constipation trouble swallowing melanoma weight loss confusion choking.    ALLERGIES: Allergies  Allergen Reactions  . Novocain [Procaine]     syncope    HOME MEDICATIONS: Outpatient Prescriptions Prior to Visit  Medication Sig Dispense Refill  . b complex vitamins capsule 1 capsule by PEG Tube route daily.     . carbidopa-levodopa (SINEMET IR) 25-100 MG per tablet Take 2 tablets by mouth 3 (three) times daily. (Patient taking differently: Give two tablets via G tube three times daily for Parkinson's) 180 tablet 12  . cholecalciferol (VITAMIN D) 1000 UNITS tablet Give two tablets via G tube daily for supplement    . doxazosin (CARDURA) 1 MG tablet Give one tablet via G tube daily for BPH    . Nutritional Supplements (FEEDING SUPPLEMENT, JEVITY 1.5 CAL,) LIQD Give one can via peg tube 5x's/day flush with 67mls H2O pre&post feeding    . vitamin B-12 (CYANOCOBALAMIN) 1000 MCG tablet 1,000 mcg by PEG Tube route daily.     Marland Kitchen  lisinopril (PRINIVIL,ZESTRIL) 10 MG tablet Take one tablet via G tube daily for hypertension     No facility-administered medications prior to visit.    PAST MEDICAL HISTORY: Past Medical History  Diagnosis Date  . Hypertension   . Parkinson's disease ([place])   . Kidney stone   . Melanoma ([place]) 2010    "back"    . Lyme disease [phone]  . Pneumonia     "as a kid"  . Cardiac arrest [facility]) 1970's    "did CPR & revived him"   . Dizzy spells     "frequent"  . Syncope and collapse 11/24/2014    PAST SURGICAL HISTORY: Past Surgical History  Procedure Laterality Date  . Cataract extraction w/ intraocular lens  implant, bilateral Bilateral 1970  . Circumcision  2015  . Bladder stone removal  2015  . Cystoscopy w/ stone manipulation      "never got the stone though"  . Melanoma excision  2010    "back"  . Temporal artery biopsy / ligation  [id number]    "before dx'd w/Lyme's disease"    FAMILY HISTORY: Family History  Problem Relation Age of Onset  . Appendicitis Brother   . Pneumonia Brother     SOCIAL HISTORY:  Social History   Social History  . Marital Status: Married    Spouse Name: [name]  . Number of Children: 3  . Years of Education: 16   Occupational History  .      retired   Social History Main Topics  . Smoking status: Former Smoker -- 1.00 packs/day for 5 years    Types: Cigarettes  . Smokeless tobacco: Never Used     Comment: quit in 1960  . Alcohol Use: 4.2 oz/week    7 Shots of liquor per week     Comment: 1 shots bourbon daily  . Drug Use: No  . Sexual Activity: Not Currently   Other Topics Concern  . Not on file   Social History Narrative   Patient resides with wife, can write with hands     PHYSICAL EXAM  Filed Vitals:   06/06/15 1302  BP: 75/45  Pulse: 83  Height: 6' (1.829 m)  Weight: 176 lb 3.2 oz (79.924 kg)    Not recorded     Body mass index is 23.89 kg/(m^2).  GENERAL EXAM: Patient is in no distress; well developed, nourished and groomed; neck is supple  CARDIOVASCULAR: Regular rate and rhythm, no murmurs, no carotid bruits  NEUROLOGIC: MENTAL STATUS: awake, alert, language fluent, comprehension intact, naming intact, fund of knowledge appropriate CRANIAL NERVE: pupils equal and reactive to light, visual fields full to  confrontation, extraocular muscles intact, no nystagmus, facial sensation and strength symmetric, hearing intact, palate elevates symmetrically, uvula midline, shoulder shrug symmetric, tongue midline. HOARSE VOICE. SOFT VOICE. MOUTH TREMOR MOTOR: normal bulk and tone, MINIMAL POSTURAL TREMOR. NO RIGIDITY. MILD BRADYKINESIA IN LUE AND LLE. Full strength in the BUE, BLE SENSORY: normal and symmetric to light touch; DECR VIB AT TOES (<5SEC). COORDINATION: finger-nose-finger, fine finger movements normal REFLEXES: BUE TRACE, KNEES TRACE, ANKLES 0 GAIT/STATION: SLOW TO RISE, SHORT STEPS, MILD EN BLOC TURNING.     DIAGNOSTIC DATA (LABS, IMAGING, TESTING) - I reviewed patient records, labs, notes, testing and imaging myself where available.  Lab Results  Component Value Date   WBC 7.0 05/17/2015      Component Value Date/Time   NA 138 05/17/2015   NA 142 11/24/2014 1012   No results found  for: CHOL No results found for: HGBA1C No results found for: VITAMINB12 Lab Results  Component Value Date   TSH 1.220 11/24/2014      ASSESSMENT AND PLAN  81 y.o. year old male here with long history of essential tremor, with new-onset Parkinson's disease around 2008. On carbidopa/levodopa, with progression of confusion and gait difficulty. More BP fluctuations, falls, recent subdural hematoma (Aug 2016). Represents disease progression.  Dx: parkinson's disease (akinetic/rigid) + dysautonomia + essential tremor  PLAN: - CONTINUE carb/levo (25/100) 2 tabs TID  - use walker for balance - monitor low BP; patient does not want pressors or volume expanders at this time - follow up with social worker regarding long term care - consult home palliative care for advanced care planning and goals of care  Return in about 1 year (around 06/05/2016). or as needed   Penni Bombard, MD [id number], [id number] PM Certified in Neurology, Neurophysiology and Neuroimaging  [facility] [address], [place], [place] [address] [phone]

## 2015-06-06 NOTE — Patient Instructions (Signed)
Thank you for coming to see Gabriel Stewart at [facility]. I hope we have been able to provide you high quality care today.  You may receive a patient satisfaction survey over the next few weeks. We would appreciate your feedback and comments so that we may continue to improve ourselves and the health of our patients.  - continue current medications - I will setup home palliative care consult   ~~~~~~~~~~~~~~~~~~~~~~~~~~~~~~~~~~~~~~~~~~~~~~~~~~~~~~~~~~~~~~~~~  DR. PENUMALLI'S GUIDE TO HAPPY AND HEALTHY LIVING These are some of my general health and wellness recommendations. Some of them may apply to you better than others. Please use common sense as you try these suggestions and feel free to ask me any questions.   NUTRITION Eat more plants: colorful vegetables and berries.  Eat less sugar, salt, preservatives and processed foods.  Avoid toxins such as cigarettes and alcohol.  Drink water when you are thirsty. Warm water with a slice of lemon is an excellent morning drink to start the day.   PLANNING Prepare estate planning, living will, healthcare POA documents. Sometimes this is best planned with the help of an attorney. Theconversationproject.org and agingwithdignity.org are excellent resources.

## 2015-06-30 ENCOUNTER — Other Ambulatory Visit: Payer: Self-pay | Admitting: Adult Health

## 2015-07-04 ENCOUNTER — Telehealth: Payer: Self-pay | Admitting: Diagnostic Neuroimaging

## 2015-07-04 NOTE — Telephone Encounter (Signed)
Wife [name] called requesting a medication or injection for drooling, states drooling has gotten worse.

## 2015-07-04 NOTE — Telephone Encounter (Signed)
Spoke with husband, wife is sleeping. He is unaware of being contacted by palliative care. Informed him would call back and discuss when his wife is available. He verbalized understanding.

## 2015-07-04 NOTE — Telephone Encounter (Signed)
Could consider botox injections; also could defer to palliative care team. -VRP

## 2015-07-07 MED ORDER — GLYCOPYRROLATE 1 MG PO TABS: ORAL_TABLET | ORAL | Status: DC

## 2015-07-07 NOTE — Addendum Note (Signed)
Addended byAndrey Spearman on: 07/07/2015 02:25 PM   Modules accepted: Orders

## 2015-07-07 NOTE — Telephone Encounter (Signed)
Spoke with wife, and she states they have not heard from palliative care referral. She also states she needs more information re: Botox and possible medication choices. She states her husband has just recently had "brain surgery" and been to rehab. She states that she wants more information on both so they can make the best decision for him. She is requesting to see Dr Leta Baptist again to discuss. She is aware he does not give Botox injections and that her husband will need to see Dr Jaynee Eagles or Dr Krista Blue if he decides to have Botox. She still states she wants to see Dr Leta Baptist again before seeing another dr.  Durward Fortes will check into palliative care referral and also route her request to Dr Leta Baptist and schedule patient for earlier FU. Spoke with Rance Muir who will check on referral today.

## 2015-07-07 NOTE — Telephone Encounter (Signed)
Spoke with husband who states  His wife is unavailable. Gave him this RN's name and number, requested she call back. He verbalized understanding.

## 2015-07-07 NOTE — Telephone Encounter (Signed)
Spoke with wife and informed her dr sent in prescription of medication to help with her husband's drooling. Advised he take only once a day to begin with. Advised they keep 07/19/15 FU, but if the medication is helpful, she may cancel that FU. She verbalized understanding, appreciation for call.

## 2015-07-07 NOTE — Telephone Encounter (Signed)
Called and spoke to Spouse and relayed I Called palliative Care the referral from  [name] relayed she did not no what  Happened she will call patient's wife today . Relayed to patient's wife RN Gabriel Stewart scheduled her apt. With Dr. Leta Baptist 07/19/2015 arrive at 10:15 for a 10:30. Patient's  wife has telephone number to [place] as well. All details understood.

## 2015-07-07 NOTE — Telephone Encounter (Signed)
Patients wife is returning your call

## 2015-07-07 NOTE — Telephone Encounter (Signed)
May try low dose robinul. Will send in rx. -VRP

## 2015-07-19 ENCOUNTER — Encounter: Payer: Self-pay | Admitting: Diagnostic Neuroimaging

## 2015-07-19 ENCOUNTER — Ambulatory Visit (INDEPENDENT_AMBULATORY_CARE_PROVIDER_SITE_OTHER): Payer: Medicare PPO | Admitting: Diagnostic Neuroimaging

## 2015-07-19 VITALS — BP 89/47 | HR 63 | Ht 72.0 in | Wt 180.0 lb

## 2015-07-19 DIAGNOSIS — G20 Parkinson's disease: Secondary | ICD-10-CM | POA: Diagnosis not present

## 2015-07-19 DIAGNOSIS — K117 Disturbances of salivary secretion: Secondary | ICD-10-CM | POA: Diagnosis not present

## 2015-07-19 MED ORDER — GLYCOPYRROLATE 1 MG PO TABS: 1.0000 mg | ORAL_TABLET | Freq: Three times a day (TID) | ORAL | Status: DC

## 2015-07-19 NOTE — Patient Instructions (Signed)
-   increase robinul to 1mg  three times per day -

## 2015-07-19 NOTE — Progress Notes (Signed)
[facility]  PATIENT: Gabriel Stewart DOB: Feb 17, 1922  REFERRING CLINICIAN: Edwin Dada HISTORY FROM: patient and wife REASON FOR VISIT: follow up   HISTORICAL  CHIEF COMPLAINT:  Chief Complaint  Patient presents with  . Parkinson's disease    rm 7, wife- [name], discuss Botox vs medication for drooling, Robinul not helpful  . Follow-up    6 weeks    HISTORY OF PRESENT ILLNESS:   UPDATE 07/19/15: Since last visit, continues with drooling problem. Tried robinul 1mg  BID, without benefit.   UPDATE 06/06/15: Since last visit, had fall in [place] leading to subdural hematoma, burr hole drainage, SNF recovery, and now back home since last week. Still with drooling, low BP and balance diff. Has ST and PT at home.   UPDATE 12/01/13: Since last visit was doing well until last week (had syncope, due to orthostatic hypotension). Admitted to hosp, now back home. Parkinson's dz sxs stable.   UPDATE 06/29/14: Since last visit, fell once in [place], and in general having more confusion and memory problems. Having more prob with using the computer. Using a cane now. Trying to get back to PT. Some swallow diff.   PRIOR HPI (11/20/13): 81 year old ambidextrous male with hypertension, melanoma skin cancer, here for evaluation of Parkinson's disease. Patient has had essential tremor since 1970s, 1980s, with family history of essential tremor in his own father. Patient's essential tremor was mainly postural tremor, mild action tremor, but did not affect him throughout his life significantly. He was never on medication for essential tremor. 2008 patient developed a new kind of tremor in his left upper extremity, at rest and with action. Handwriting deteriorated. Patient was evaluated and diagnosed with Parkinson's disease. 2 started on carbidopa/levodopa with improvement in tremor control. She's been managed at [facility] for the past year after moving to [place]. Now wants to  establish with local neurologist. Patient has been on carbidopa/levodopa 25/100, 2 tablets 3 times per day. Patient followed up with primary care physician, and possibly due to miscommunication, started taking 2 tablets 4 times a day several days ago. No benefit or adverse effects. Patient was doing quite well on 2 tablets 3 times a day. No on a fluctuations. No wearing off. Patient having some balance difficulty shuffling gait. No falls recently. She's having difficulty swallowing. He has some saliva control issues. Hoarse and soft voice is notable. He has decreased sense of smell and taste. No REM behavior sleep disorder symptoms. He has mild to moderate constipation.  REVIEW OF SYSTEMS: Full 14 system review of systems performed and notable only for memory loss slurred speech difficulty swallowing tremor decreased energy running nose constipation trouble swallowing melanoma weight loss confusion choking.    ALLERGIES: Allergies  Allergen Reactions  . Novocain [Procaine]     Syncope, "passes out"    HOME MEDICATIONS: Outpatient Prescriptions Prior to Visit  Medication Sig Dispense Refill  . b complex vitamins capsule 1 capsule by PEG Tube route daily.     . carbidopa-levodopa (SINEMET IR) 25-100 MG per tablet Take 2 tablets by mouth 3 (three) times daily. (Patient taking differently: Give two tablets via G tube three times daily for Parkinson's) 180 tablet 12  . cholecalciferol (VITAMIN D) 1000 UNITS tablet Give two tablets via G tube daily for supplement    . doxazosin (CARDURA) 1 MG tablet Give one tablet via G tube daily for BPH    . glycopyrrolate (ROBINUL) 1 MG tablet 1mg  daily up to twice a day;  to control drooling 60 tablet 3  . lisinopril (PRINIVIL,ZESTRIL) 10 MG tablet Take one tablet via G tube daily for hypertension    . vitamin B-12 (CYANOCOBALAMIN) 1000 MCG tablet 1,000 mcg by PEG Tube route daily.     . Nutritional Supplements (FEEDING SUPPLEMENT, JEVITY 1.5 CAL,) LIQD Give one  can via peg tube 5x's/day flush with 6mls H2O pre&post feeding     No facility-administered medications prior to visit.    PAST MEDICAL HISTORY: Past Medical History  Diagnosis Date  . Hypertension   . Parkinson's disease ([place])   . Kidney stone   . Melanoma ([place]) 2010    "back"  . Lyme disease [phone]  . Pneumonia     "as a kid"  . Cardiac arrest [facility]) 1970's    "did CPR & revived him"   . Dizzy spells     "frequent"  . Syncope and collapse 11/24/2014    PAST SURGICAL HISTORY: Past Surgical History  Procedure Laterality Date  . Cataract extraction w/ intraocular lens  implant, bilateral Bilateral 1970  . Circumcision  2015  . Bladder stone removal  2015  . Cystoscopy w/ stone manipulation      "never got the stone though"  . Melanoma excision  2010    "back"  . Temporal artery biopsy / ligation  [id number]    "before dx'd w/Lyme's disease"    FAMILY HISTORY: Family History  Problem Relation Age of Onset  . Appendicitis Brother   . Pneumonia Brother     SOCIAL HISTORY:  Social History   Social History  . Marital Status: Married    Spouse Name: [name]  . Number of Children: 3  . Years of Education: 16   Occupational History  .      retired   Social History Main Topics  . Smoking status: Former Smoker -- 1.00 packs/day for 5 years    Types: Cigarettes  . Smokeless tobacco: Never Used     Comment: quit in 1960  . Alcohol Use: 4.2 oz/week    7 Shots of liquor per week     Comment: 1 shots bourbon daily  . Drug Use: No  . Sexual Activity: Not Currently   Other Topics Concern  . Not on file   Social History Narrative   Patient resides with wife, can write with hands     PHYSICAL EXAM  Filed Vitals:   07/19/15 1048  BP: 89/47  Pulse: 63  Height: 6' (1.829 m)  Weight: 180 lb (81.647 kg)    Not recorded     Body mass index is 24.41 kg/(m^2).  GENERAL EXAM: Patient is in no distress; well developed, nourished and groomed; neck is  supple  CARDIOVASCULAR: Regular rate and rhythm, no murmurs, no carotid bruits  NEUROLOGIC: MENTAL STATUS: awake, alert, language fluent, comprehension intact, naming intact, fund of knowledge appropriate CRANIAL NERVE: pupils equal and reactive to light, visual fields full to confrontation, extraocular muscles intact, no nystagmus, facial sensation and strength symmetric, hearing intact, palate elevates symmetrically, uvula midline, shoulder shrug symmetric, tongue midline. HOARSE VOICE. SOFT VOICE. MOUTH TREMOR MOTOR: normal bulk and tone, MINIMAL POSTURAL TREMOR. NO RIGIDITY. MILD BRADYKINESIA IN LUE AND LLE. Full strength in the BUE, BLE SENSORY: normal and symmetric to light touch; DECR VIB AT TOES (<5SEC). COORDINATION: finger-nose-finger, fine finger movements normal REFLEXES: BUE TRACE, KNEES TRACE, ANKLES 0 GAIT/STATION: SLOW TO RISE, SHORT STEPS, MILD EN BLOC TURNING.     DIAGNOSTIC DATA (LABS, IMAGING, TESTING) -  I reviewed patient records, labs, notes, testing and imaging myself where available.  Lab Results  Component Value Date   WBC 7.0 05/17/2015      Component Value Date/Time   NA 138 05/17/2015   NA 142 11/24/2014 1012   No results found for: CHOL No results found for: HGBA1C No results found for: VITAMINB12 Lab Results  Component Value Date   TSH 1.220 11/24/2014      ASSESSMENT AND PLAN  81 y.o. year old male here with long history of essential tremor, with new-onset Parkinson's disease around 2008. On carbidopa/levodopa, with progression of confusion and gait difficulty. More BP fluctuations, falls, recent subdural hematoma (Aug 2016). Represents disease progression.  Dx: parkinson's disease (akinetic/rigid) + dysautonomia + essential tremor  PLAN: - increase robinul to 1mg  TID for drooling - continue carb/levo (25/100) 2 tabs TID  - use walker for balance - monitor low BP; patient does not want pressors or volume expanders at this time - continue  home palliative care for advanced care planning and goals of care - follow up with social worker regarding long term care  Meds ordered this encounter  Medications  . glycopyrrolate (ROBINUL) 1 MG tablet    Sig: Take 1 tablet (1 mg total) by mouth 3 (three) times daily.    Dispense:  90 tablet    Refill:  6   Return in about 6 months (around 01/17/2016).    Penni Bombard, MD [id number], [id number] Certified in Neurology, Neurophysiology and Neuroimaging  [facility] [address], [place], [place] [address] [phone]

## 2015-08-11 NOTE — Therapy (Signed)
[facility] [address] [place], [place], [address] Phone: [phone]   Fax:  [phone]  Patient Details  Name: Gabriel Stewart MRN: [id number] Date of Birth: [birth year] Referring Provider:  No ref. provider found  Encounter Date: 08/24/2014  Note created in error.  Nixon Kolton W. 08/11/2015, 12:11 PM  Frazier Butt., PT  [facility] [address] [place], [place], [address] Phone: [phone]   Fax:  [phone]

## 2015-08-26 IMAGING — CR DG CHEST 2V
2 series · 2 of 2 positions shown · non-contrast
Comparison: 10/29/2014 and 07/28/2013.

CLINICAL DATA: Syncopal episode today. Weakness. Initial encounter.

EXAM:
CHEST  2 VIEW

[w chest lat]
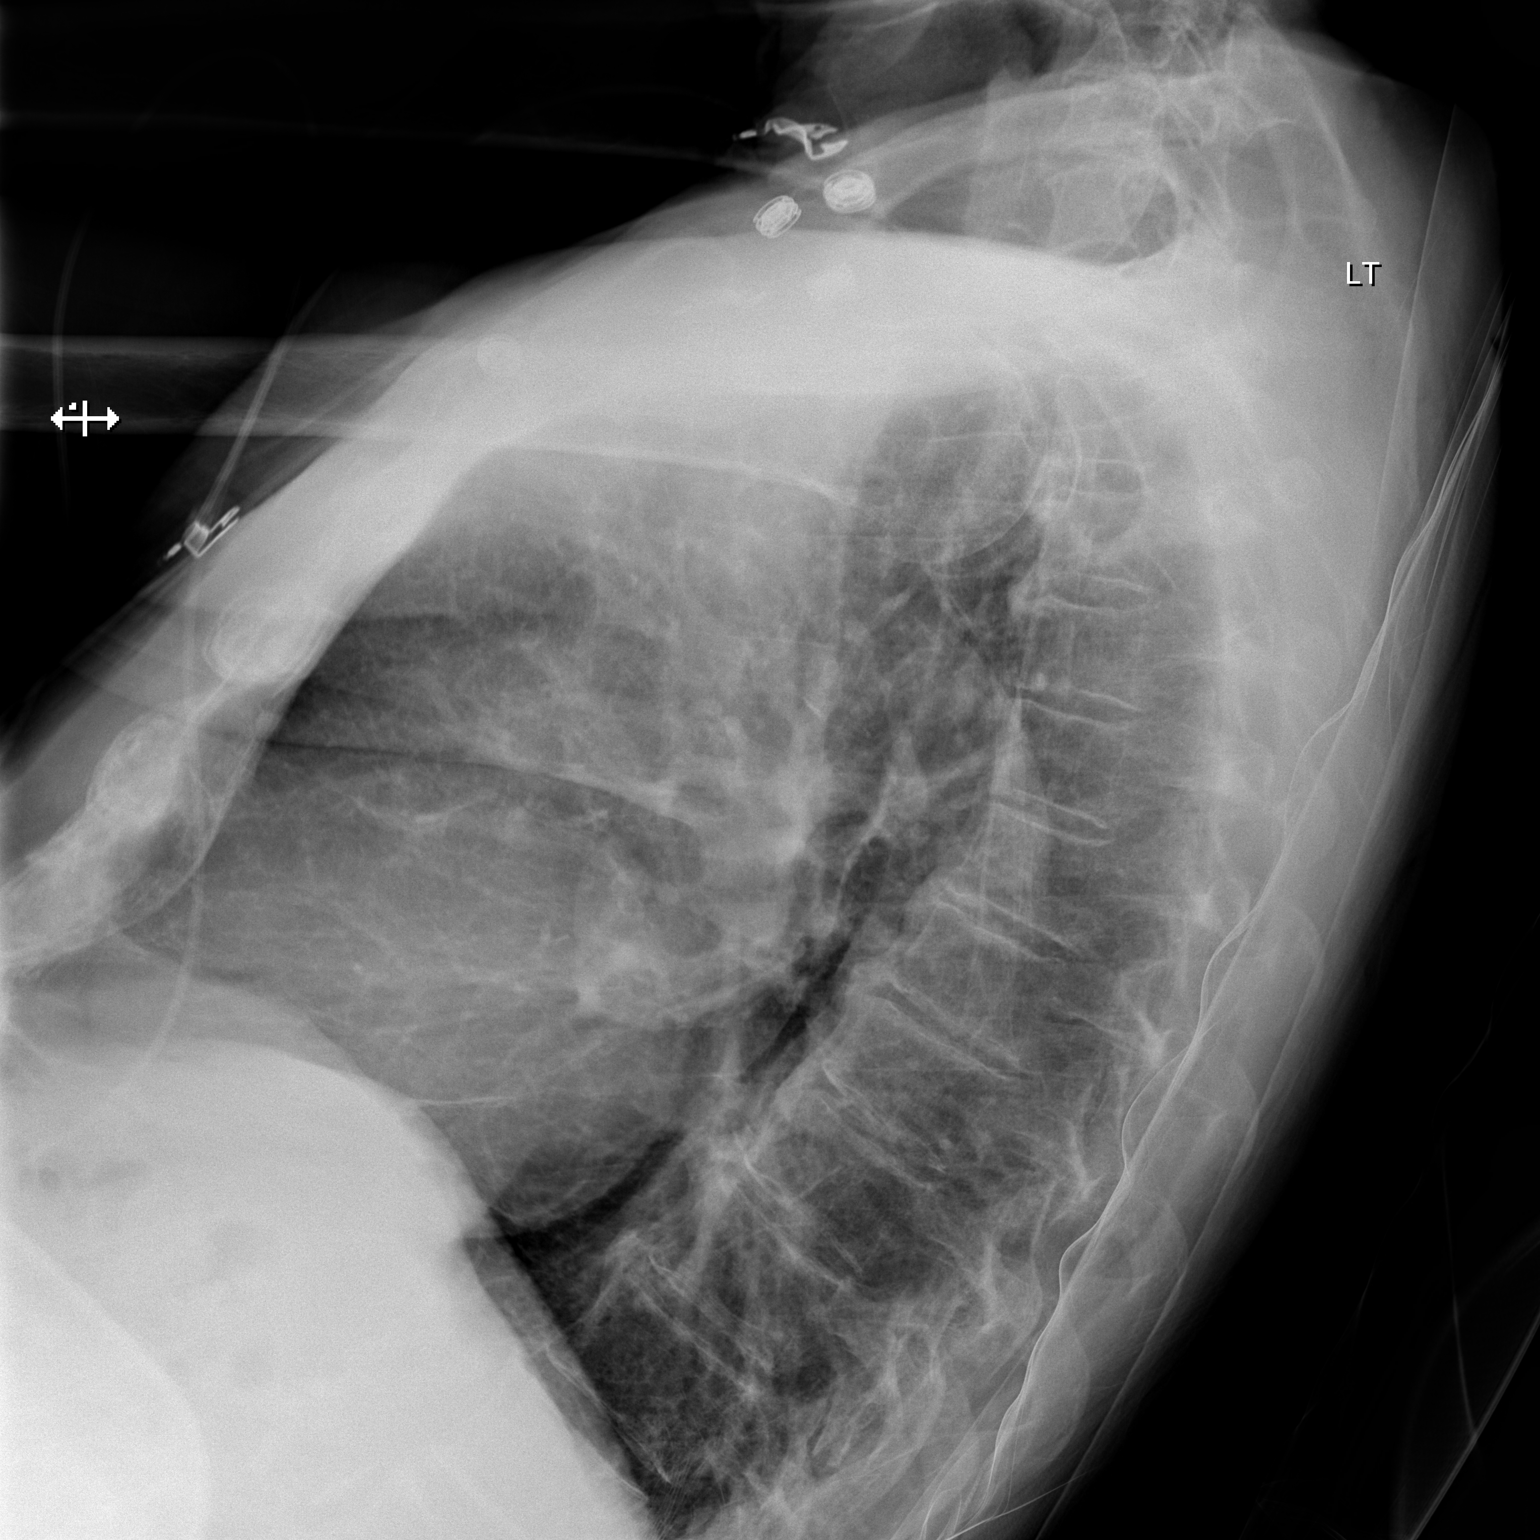

[x chest ap]
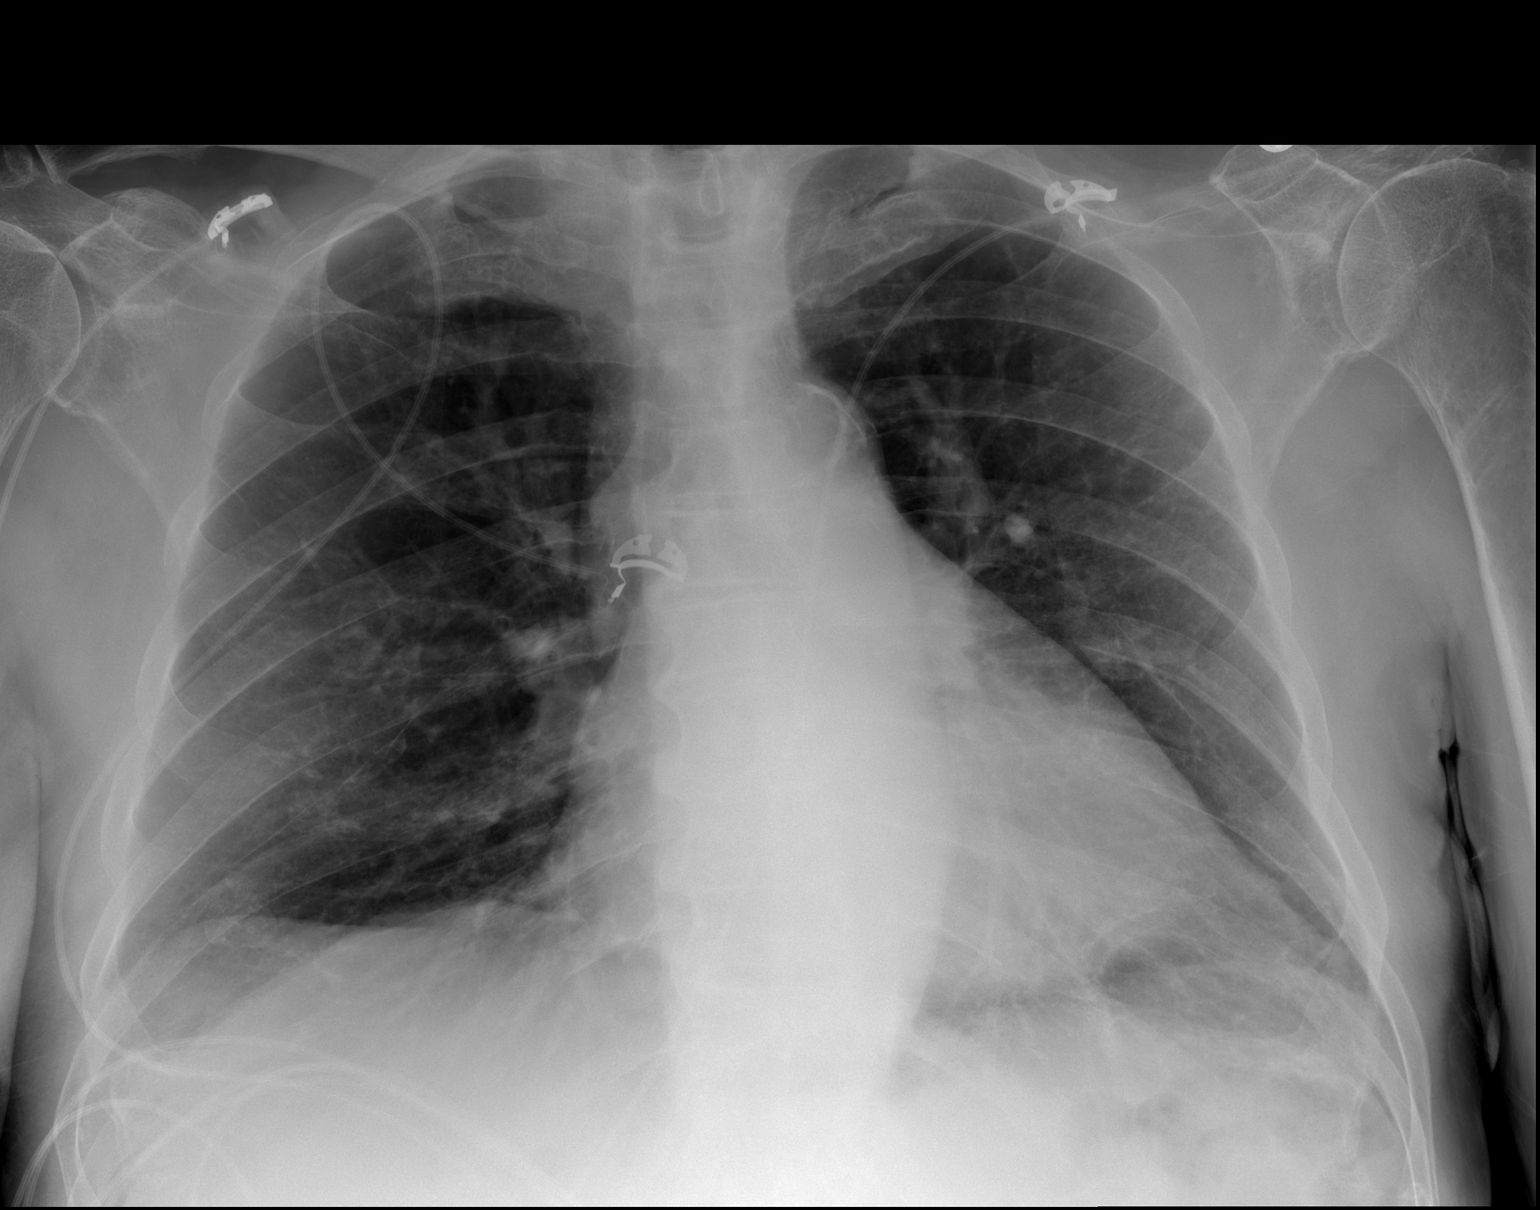

[2 of 2 positions shown; findings below may reference images not displayed]

FINDINGS: The heart size and mediastinal contours are normal. The lungs are
clear. There is no pleural effusion or pneumothorax. No acute
osseous findings are identified. Mild thoracic spine degenerative
changes noted.
IMPRESSION: Stable chest.  No acute cardiopulmonary process.

## 2016-02-07 ENCOUNTER — Telehealth: Payer: Self-pay | Admitting: Diagnostic Neuroimaging

## 2016-02-07 MED ORDER — CARBIDOPA-LEVODOPA 25-100 MG PO TABS: 2.0000 | ORAL_TABLET | Freq: Three times a day (TID) | ORAL | Status: DC

## 2016-02-07 NOTE — Telephone Encounter (Signed)
Patient requesting refill of carbidopa-levodopa (SINEMET IR) 25-100 MG per tablet [facility]

## 2016-02-07 NOTE — Telephone Encounter (Signed)
Spoke with [facility] who stated patient's last prescription on file for carbidopa-levodopa was dated 06/2014.  She stated patient gets other medications from them. Per Dr Gladstone Lighter last OV note, patient is to continue on this medication. Advised Loma Sousa will e scribe refill. Patient has follow up Dec 2017.

## 2016-03-12 ENCOUNTER — Telehealth: Payer: Self-pay | Admitting: Diagnostic Neuroimaging

## 2016-03-12 MED ORDER — GLYCOPYRROLATE 1 MG PO TABS: 1.0000 mg | ORAL_TABLET | Freq: Three times a day (TID) | ORAL | 1 refills | Status: DC

## 2016-03-12 NOTE — Telephone Encounter (Addendum)
Wife [name] called to request prescription renewal/refill of glycopyrrolate (ROBINUL) 1 MG tablet to [facility] also requests a 90 day supply and to be sent to temporary [place] address.

## 2016-05-26 ENCOUNTER — Observation Stay (HOSPITAL_COMMUNITY)
Admission: EM | Admit: 2016-05-26 | Discharge: 2016-05-27 | Disposition: A | Discharge status: Home / Self Care | Payer: Medicare PPO | Attending: Family Medicine | Admitting: Family Medicine

## 2016-05-26 ENCOUNTER — Emergency Department (HOSPITAL_COMMUNITY): Payer: Medicare PPO

## 2016-05-26 ENCOUNTER — Encounter (HOSPITAL_COMMUNITY): Payer: Self-pay

## 2016-05-26 DIAGNOSIS — Z79899 Other long term (current) drug therapy: Secondary | ICD-10-CM | POA: Insufficient documentation

## 2016-05-26 DIAGNOSIS — G20 Parkinson's disease: Secondary | ICD-10-CM | POA: Insufficient documentation

## 2016-05-26 DIAGNOSIS — R079 Chest pain, unspecified: Principal | ICD-10-CM | POA: Diagnosis present

## 2016-05-26 DIAGNOSIS — Z87891 Personal history of nicotine dependence: Secondary | ICD-10-CM | POA: Diagnosis not present

## 2016-05-26 DIAGNOSIS — G20A1 Parkinson's disease without dyskinesia, without mention of fluctuations: Secondary | ICD-10-CM | POA: Diagnosis present

## 2016-05-26 DIAGNOSIS — I4510 Unspecified right bundle-branch block: Secondary | ICD-10-CM | POA: Diagnosis present

## 2016-05-26 DIAGNOSIS — I10 Essential (primary) hypertension: Secondary | ICD-10-CM | POA: Diagnosis not present

## 2016-05-26 DIAGNOSIS — R1310 Dysphagia, unspecified: Secondary | ICD-10-CM

## 2016-05-26 LAB — BASIC METABOLIC PANEL
ANION GAP: 6 (ref 5–15)
BUN: 17 mg/dL (ref 6–20)
CALCIUM: 9.3 mg/dL (ref 8.9–10.3)
CO2: 29 mmol/L (ref 22–32)
Chloride: 105 mmol/L (ref 101–111)
Creatinine, Ser: 0.88 mg/dL (ref 0.61–1.24)
GLUCOSE: 105 mg/dL — AB (ref 65–99)
POTASSIUM: 4.3 mmol/L (ref 3.5–5.1)
SODIUM: 140 mmol/L (ref 135–145)

## 2016-05-26 LAB — CBC
HCT: 37.3 % — ABNORMAL LOW (ref 39.0–52.0)
Hemoglobin: 12.2 g/dL — ABNORMAL LOW (ref 13.0–17.0)
MCH: 31.2 pg (ref 26.0–34.0)
MCHC: 32.7 g/dL (ref 30.0–36.0)
MCV: 95.4 fL (ref 78.0–100.0)
PLATELETS: 218 10*3/uL (ref 150–400)
RBC: 3.91 MIL/uL — AB (ref 4.22–5.81)
RDW: 13.3 % (ref 11.5–15.5)
WBC: 8.5 10*3/uL (ref 4.0–10.5)

## 2016-05-26 LAB — TROPONIN I
Troponin I: <0.03 ng/mL (ref <0.03)
Troponin I: <0.03 ng/mL (ref <0.03)

## 2016-05-26 LAB — I-STAT TROPONIN, ED: TROPONIN I, POC: 0 ng/mL (ref 0.00–0.08)

## 2016-05-26 MED ORDER — GLYCOPYRROLATE 1 MG PO TABS
1.0000 mg | ORAL_TABLET | Freq: Three times a day (TID) | ORAL | Status: DC
  Administered 2016-05-26 – 2016-05-27 (×3): 1 mg via ORAL
  Filled 2016-05-26 (×5): qty 1

## 2016-05-26 MED ORDER — B COMPLEX VITAMINS PO CAPS: 1.0000 | ORAL_CAPSULE | Freq: Every day | ORAL | Status: DC

## 2016-05-26 MED ORDER — ACETAMINOPHEN 325 MG PO TABS
650.0000 mg | ORAL_TABLET | ORAL | Status: DC | PRN
Start: 2016-05-26 — End: 2016-05-27

## 2016-05-26 MED ORDER — PANTOPRAZOLE SODIUM 40 MG PO TBEC
40.0000 mg | DELAYED_RELEASE_TABLET | Freq: Every day | ORAL | Status: DC
  Administered 2016-05-26 – 2016-05-27 (×2): 40 mg via ORAL
  Filled 2016-05-26 (×2): qty 1

## 2016-05-26 MED ORDER — NITROGLYCERIN 0.4 MG SL SUBL: 0.4000 mg | SUBLINGUAL_TABLET | SUBLINGUAL | Status: DC | PRN

## 2016-05-26 MED ORDER — ALUM & MAG HYDROXIDE-SIMETH 200-200-20 MG/5ML PO SUSP
30.0000 mL | ORAL | Status: DC | PRN
  Administered 2016-05-26: 30 mL via ORAL
  Filled 2016-05-26: qty 30

## 2016-05-26 MED ORDER — GI COCKTAIL ~~LOC~~
30.0000 mL | Freq: Two times a day (BID) | ORAL | Status: DC | PRN
  Administered 2016-05-26: 30 mL via ORAL
  Filled 2016-05-26: qty 30

## 2016-05-26 MED ORDER — VITAMIN B-12 1000 MCG PO TABS
1000.0000 ug | ORAL_TABLET | Freq: Every day | ORAL | Status: DC
  Administered 2016-05-27: 1000 ug via ORAL
  Filled 2016-05-26: qty 1

## 2016-05-26 MED ORDER — ASPIRIN 325 MG PO TABS
325.0000 mg | ORAL_TABLET | Freq: Every day | ORAL | Status: DC
  Administered 2016-05-27: 325 mg via ORAL
  Filled 2016-05-26: qty 1

## 2016-05-26 MED ORDER — CARBIDOPA-LEVODOPA 25-100 MG PO TABS
2.0000 | ORAL_TABLET | Freq: Three times a day (TID) | ORAL | Status: DC
  Administered 2016-05-26 – 2016-05-27 (×3): 2 via ORAL
  Filled 2016-05-26 (×4): qty 2

## 2016-05-26 MED ORDER — ADULT MULTIVITAMIN W/MINERALS CH
1.0000 | ORAL_TABLET | Freq: Every day | ORAL | Status: DC
  Administered 2016-05-27: 1 via ORAL
  Filled 2016-05-26: qty 1

## 2016-05-26 MED ORDER — ENOXAPARIN SODIUM 40 MG/0.4ML ~~LOC~~ SOLN
40.0000 mg | SUBCUTANEOUS | Status: DC
  Administered 2016-05-26: 40 mg via SUBCUTANEOUS
  Filled 2016-05-26 (×2): qty 0.4

## 2016-05-26 MED ORDER — ENSURE ENLIVE PO LIQD
237.0000 mL | Freq: Three times a day (TID) | ORAL | Status: DC
  Administered 2016-05-26 – 2016-05-27 (×2): 237 mL via ORAL
  Filled 2016-05-26: qty 237

## 2016-05-26 MED ORDER — LISINOPRIL 10 MG PO TABS
10.0000 mg | ORAL_TABLET | Freq: Every day | ORAL | Status: DC
  Administered 2016-05-27: 10 mg via ORAL
  Filled 2016-05-26: qty 1

## 2016-05-26 MED ORDER — B COMPLEX-C PO TABS
1.0000 | ORAL_TABLET | Freq: Every day | ORAL | Status: DC
  Administered 2016-05-27: 1 via ORAL
  Filled 2016-05-26: qty 1

## 2016-05-26 MED ORDER — ONDANSETRON HCL 4 MG/2ML IJ SOLN: 4.0000 mg | Freq: Four times a day (QID) | INTRAMUSCULAR | Status: DC | PRN

## 2016-05-26 NOTE — ED Notes (Signed)
Pt to xray

## 2016-05-26 NOTE — ED Notes (Signed)
ED Provider at bedside. 

## 2016-05-26 NOTE — H&P (Addendum)
[facility] History and Physical  Gabriel Stewart [id number] DOB: [birth year] DOA: 05/26/2016  Referring physician: ED PCP: Gerrit Heck, MD   Chief Complaint: chest pain  HPI: Gabriel Stewart is a 82 y.o. male Parkinson's disease and hypertension, was sitting at home reading the newspaper when he started noticing midepigastric chest pain with radiation up the anterior aspect of his neck,  that was associated with difficult time swallowing. Denies shortness of breath, diaphoresis, nausea or vomiting.  No syncopal, presyncopal episodes, denies proximal nocturnal dyspnea. Denies any orthopnea. Family gave patient aspirin 325 at home and his pain slowly resolved by the time the ED overnight he was at baseline.  Of note, patient ad a cardiac arrest in the 1970s,  extensive workup at the time,  Had some abnormal EKG changes, but never attributed to her actual "heart attack".  Of note, patient received a G-tube in [place] last year. Wife says she used it for about 3-4 months and has not used it in the last 6 months. He's been eating food at home with additional insurers. However, lately wife is concerned that he may not be getting enough nutrition and is considering going back to his G-tube but she's not certain what to do.   Review of Systems:  Per HPI, o/w all systems reviewed and negative.  Past Medical History:  Diagnosis Date  . Cardiac arrest [facility]) 1970's   "did CPR & revived him"   . Dizzy spells    "frequent"  . Hypertension   . Kidney stone   . Lyme disease [phone]  . Melanoma ([place]) 2010   "back"  . Parkinson's disease ([place])   . Pneumonia    "as a kid"  . Syncope and collapse 11/24/2014   Past Surgical History:  Procedure Laterality Date  . BLADDER STONE REMOVAL  2015  . CATARACT EXTRACTION W/ INTRAOCULAR LENS  IMPLANT, BILATERAL Bilateral 1970  . CIRCUMCISION  2015  . CYSTOSCOPY W/ STONE MANIPULATION     "never got the stone though"  . MELANOMA  EXCISION  2010   "back"  . TEMPORAL ARTERY BIOPSY / LIGATION  [id number]   "before dx'd w/Lyme's disease"   Social History:  reports that he has quit smoking. His smoking use included Cigarettes. He has a 5.00 pack-year smoking history. He has never used smokeless tobacco. He reports that he drinks about 4.2 oz of alcohol per week . He reports that he does not use drugs.  Allergies  Allergen Reactions  . Novocain [Procaine]     Syncope, "passes out"    Family History  Problem Relation Age of Onset  . Appendicitis Brother   . Pneumonia Brother    Prior to Admission medications   Medication Sig Start Date End Date Taking? Authorizing Provider  b complex vitamins capsule Take 1 capsule by mouth daily.    Yes Historical Provider, MD  carbidopa-levodopa (SINEMET IR) 25-100 MG tablet Take 2 tablets by mouth 3 (three) times daily. 02/07/16  Yes Vikram R Penumalli, MD  ENSURE (ENSURE) Take 237 mLs by mouth. Or Equate, 350 calories each, 5 cans daily   Yes Historical Provider, MD  glycopyrrolate (ROBINUL) 1 MG tablet Take 1 tablet (1 mg total) by mouth 3 (three) times daily. 03/12/16  Yes Penni Bombard, MD  lisinopril (PRINIVIL,ZESTRIL) 10 MG tablet Take 10 mg by mouth daily.  09/15/14  Yes Historical Provider, MD  Multiple Vitamins-Minerals (CENTRUM SILVER) tablet Take 1 tablet by mouth daily.   Yes Historical Provider,  MD  vitamin B-12 (CYANOCOBALAMIN) 1000 MCG tablet Take 1,000 mcg by mouth daily.    Yes Historical Provider, MD   Physical Exam: Vitals:   05/26/16 1256 05/26/16 1315 05/26/16 1345 05/26/16 1425  BP:  142/79 133/60 133/74  Pulse:  (!) 41 61 60  Resp:    17  Temp:      TempSrc:      SpO2: 100% 100% 100% 100%  Weight:      Height:        Wt Readings from Last 3 Encounters:  05/26/16 79.4 kg (175 lb)  07/19/15 81.6 kg (180 lb)  06/06/15 79.9 kg (176 lb 3.2 oz)    General:  Appears calm and comfortable, pleasant, NAD, AAOx3, pleasant.  Eyes: PERRL, normal lids,  irises & conjunctiva ENT: grossly normal hearing, lips & tongue, mmm Neck: no LAD, masses or thyromegaly Cardiovascular: RRR, no m/r/g. No LE edema.  Pain not reproducible. Telemetry: SR, no arrhythmias  Respiratory: CTA bilaterally, no w/r/r. Normal respiratory effort. Abdomen: soft, ntnd, +gtube. Skin: no rash or induration seen on limited exam Musculoskeletal: grossly normal tone BUE/BLE Psychiatric: grossly normal mood and affect, speech fluent and appropriate Neurologic: grossly non-focal.          Labs on Admission:  Basic Metabolic Panel:  Recent Labs Lab 05/26/16 1317  NA 140  K 4.3  CL 105  CO2 29  GLUCOSE 105*  BUN 17  CREATININE 0.88  CALCIUM 9.3   Liver Function Tests: No results for input(s): AST, ALT, ALKPHOS, BILITOT, PROT, ALBUMIN in the last 168 hours. No results for input(s): LIPASE, AMYLASE in the last 168 hours. No results for input(s): AMMONIA in the last 168 hours. CBC:  Recent Labs Lab 05/26/16 1317  WBC 8.5  HGB 12.2*  HCT 37.3*  MCV 95.4  PLT 218   Cardiac Enzymes: No results for input(s): CKTOTAL, CKMB, CKMBINDEX, TROPONINI in the last 168 hours.  BNP (last 3 results) No results for input(s): BNP in the last 8760 hours.  ProBNP (last 3 results) No results for input(s): PROBNP in the last 8760 hours.  CBG: No results for input(s): GLUCAP in the last 168 hours.  Radiological Exams on Admission: Dg Chest 2 View  Result Date: 05/26/2016 CLINICAL DATA:  Chest pain since 0800 hours, hypertension, Parkinson's, history melanoma EXAM: CHEST  2 VIEW COMPARISON:  11/24/2014 FINDINGS: Normal heart size, mediastinal contours, and pulmonary vascularity. Atherosclerotic calcifications aorta. Lungs emphysematous but clear. No infiltrate, pleural effusion or pneumothorax. Bones demineralized. IMPRESSION: Emphysematous changes without acute infiltrate. Aortic atherosclerosis. Electronically Signed   By: Lavonia Dana M.D.   On: 05/26/2016 14:24     EKG: Independently reviewed.   EKG Interpretation  Date/Time:  Saturday May 26 2016 12:47:30 EDT Ventricular Rate:  57 PR Interval:    QRS Duration: 156 QT Interval:  476 QTC Calculation: 464 R Axis:   62 Text Interpretation:  Sinus rhythm Atrial premature complexes Right bundle branch block Probable lateral infarct, old Baseline wander in lead(s) V1 Confirmed by Lita Mains  MD, DAVID ([id number]) on 05/26/2016 2:23:31 PM      5/16 echo Study Conclusions  - Left ventricle: The cavity size was normal. Wall thickness was   increased in a pattern of mild LVH. Systolic function was normal.   The estimated ejection fraction was in the range of 55% to 60%.   Wall motion was normal; there were no regional wall motion   abnormalities. Doppler parameters are consistent with abnormal   left  ventricular relaxation (grade 1 diastolic dysfunction). - Aortic valve: There was no stenosis. - Aorta: Mildly dilated aortic root. Aortic root dimension: 39 mm   (ED). - Mitral valve: Mildly calcified annulus. There was no significant   regurgitation. - Left atrium: The atrium was mildly dilated. - Right ventricle: The cavity size was normal. Systolic function   was normal. - Right atrium: The atrium was mildly dilated. - Pulmonary arteries: No complete TR doppler jet so unable to   estimate PA systolic pressure. - Inferior vena cava: The vessel was normal in size. The   respirophasic diameter changes were in the normal range (>= 50%),   consistent with normal central venous pressure.  Impressions:  - Normal LV size with mild LV hypertrophy. EF 55-60%. Normal RV   size and systolic function. No significant valvular   abnormalities.  Assessment/Plan Principal Problem:   Chest pain Active Problems:   Parkinson's disease ([place])   RBBB   Dysphagia   Hypertension   1. Chest pain syndrome, w/ old RBBB - Placed in observation telemetry. - Cardiology was consulted by the ER. I d/w Dr  Marlou Porch, cardiology, who reviewed all the studies, nothing to add on his standpoint. - Continue aspirin daily at this time - emp ppi for gi cause - serial CEs - am ekg - echo ordered  2. htn -  - controlled, continue lisinopril  3. Parkinson's disease -  Cont sinemet  4. Dysphagia - swallow eval ordered, if ok will order soft diet. - may need to transition to using Gtube fulltime if fails, wife has not used it in 35months. - nutrition consult   Cardiology c/s by ER, dw Dr Marlou Porch, reconsult if need, med mgmt rec at this time.  Code Status: DNR, confirmed with patient and wife DVT Prophylaxis: lovenox [place] Family Communication: pt, wife at bedside Disposition Plan: obs tele  Time spent: 79mins  Maren Reamer MD., MBA/MHA [facility] Pager [phone]

## 2016-05-26 NOTE — ED Triage Notes (Signed)
BIB [facility] from home where pt.s wife called because of central chest pain that started at 1130 today. Pt. Also c/o neck pain around throat. Denies radiation to jaw or arms. Denies SOB, lightheaded, or dizziness. EMS reports wife gave 325 of aspirin at home. Upon arrival pain 5/10 once in truck pain 4/10.

## 2016-05-26 NOTE — ED Provider Notes (Signed)
[facility] DEPT Provider Note   CSN: [id number] Arrival date & time: 05/26/16  1241  History   Chief Complaint Chief Complaint  Patient presents with  . Chest Pain   HPI Gabriel Stewart is a 82 y.o. male presenting with chest pain.  HPI  Reports he was sitting at home this morning when he developed pain over the left side of his chest with radiation up into the anterior neck. Denies shortness of breath, diaphoresis, nausea with pain. Pain has slowly been resolving following 325mg  Aspirin given by family. Has not received Nitro. Reports he is almost at baseline on arrival to ED. Denies any orthopnea, paroxysmal nocturnal dyspnea, or shortness of breath with exertion. Denies cardiac history, however chart review shows history of cardiac arrest in the 1970s requiring CPR. States he was in [place] and they had to "press on his chest." Was told by a physician that he had a heart attack and then cardiac arrest. States this physician was later arrested for fraud and alcoholism. Gabriel and [name] do not believe this was a true heart attack and he has had   History of Parkinsons (currently on Sinemet), resulting in gait difficulty and hoarseness. Also with Hypertension, currently on Lisinopril 10mg . Echocardiogram in 11/2014 with EF 55-60%, mild LVH, grade 1 diastolic dysfunction.  Past Medical History:  Diagnosis Date  . Cardiac arrest [facility]) 1970's   "did CPR & revived him"   . Dizzy spells    "frequent"  . Hypertension   . Kidney stone   . Lyme disease [phone]  . Melanoma ([place]) 2010   "back"  . Parkinson's disease ([place])   . Pneumonia    "as a kid"  . Syncope and collapse 11/24/2014    Patient Active Problem List   Diagnosis Date Noted  . Parkinson disease ([place]) 07/19/2015  . Sialorrhea 07/19/2015  . Subdural hematoma S/P Craniotomy and drainage 05/17/2015  . Dysphagia 05/17/2015  . Hypertension 05/17/2015  . BPH (benign prostatic hyperplasia) 05/17/2015  .  Protein-calorie malnutrition, severe ([place]) 11/25/2014  . Syncope 11/24/2014  . RBBB 11/24/2014  . Orthostatic hypotension 11/24/2014  . Dehydration 11/24/2014  . Unintentional weight loss 11/24/2014  . Syncope and collapse 11/24/2014  . Parkinson's disease ([place]) 11/20/2013  . Essential tremor 11/20/2013  . Gait difficulty 11/20/2013  . Swallowing difficulty 11/20/2013  . Hoarse voice quality 11/20/2013    Past Surgical History:  Procedure Laterality Date  . BLADDER STONE REMOVAL  2015  . CATARACT EXTRACTION W/ INTRAOCULAR LENS  IMPLANT, BILATERAL Bilateral 1970  . CIRCUMCISION  2015  . CYSTOSCOPY W/ STONE MANIPULATION     "never got the stone though"  . MELANOMA EXCISION  2010   "back"  . TEMPORAL ARTERY BIOPSY / LIGATION  [id number]   "before dx'd w/Lyme's disease"    Home Medications    Prior to Admission medications   Medication Sig Start Date End Date Taking? Authorizing Provider  b complex vitamins capsule Take 1 capsule by mouth daily.    Yes Historical Provider, MD  carbidopa-levodopa (SINEMET IR) 25-100 MG tablet Take 2 tablets by mouth 3 (three) times daily. 02/07/16  Yes Vikram R Penumalli, MD  ENSURE (ENSURE) Take 237 mLs by mouth. Or Equate, 350 calories each, 5 cans daily   Yes Historical Provider, MD  glycopyrrolate (ROBINUL) 1 MG tablet Take 1 tablet (1 mg total) by mouth 3 (three) times daily. 03/12/16  Yes Penni Bombard, MD  lisinopril (PRINIVIL,ZESTRIL) 10 MG tablet Take 10 mg by  mouth daily.  09/15/14  Yes Historical Provider, MD  Multiple Vitamins-Minerals (CENTRUM SILVER) tablet Take 1 tablet by mouth daily.   Yes Historical Provider, MD  vitamin B-12 (CYANOCOBALAMIN) 1000 MCG tablet Take 1,000 mcg by mouth daily.    Yes Historical Provider, MD    Family History Family History  Problem Relation Age of Onset  . Appendicitis Brother   . Pneumonia Brother     Social History Social History  Substance Use Topics  . Smoking status: Former Smoker     Packs/day: 1.00    Years: 5.00    Types: Cigarettes  . Smokeless tobacco: Never Used     Comment: quit in 1960  . Alcohol use 4.2 oz/week    7 Shots of liquor per week     Comment: 1 shots bourbon daily    Allergies   Novocain [procaine]   Review of Systems Review of Systems  Constitutional: Negative for diaphoresis.  Respiratory: Negative for cough, shortness of breath and wheezing.   Cardiovascular: Positive for chest pain. Negative for palpitations and leg swelling.  Gastrointestinal: Negative for abdominal pain, nausea and vomiting.  Musculoskeletal: Positive for neck pain.  Skin: Negative for rash.  Neurological: Negative for dizziness.   Physical Exam Updated Vital Signs BP 133/74 (BP Location: Right Arm)   Pulse 60   Temp 98.1 F (36.7 C) (Oral)   Resp 17   Ht 6' (1.829 m)   Wt 79.4 kg   SpO2 100%   BMI 23.73 kg/m   Physical Exam  Constitutional: He appears well-developed and well-nourished. No distress.  HENT:  Head: Normocephalic and atraumatic.  Cardiovascular: Normal rate.  Exam reveals no friction rub.   No murmur heard. Pulmonary/Chest: Effort normal. No respiratory distress. He has no wheezes.  Abdominal: Soft. He exhibits no distension. There is no tenderness.  Musculoskeletal: He exhibits no edema.  Skin: No rash noted.  Psychiatric: He has a normal mood and affect. His behavior is normal.    ED Treatments / Results  Labs (all labs ordered are listed, but only abnormal results are displayed) Labs Reviewed  CBC - Abnormal; Notable for the following:       Result Value   RBC 3.91 (*)    Hemoglobin 12.2 (*)    HCT 37.3 (*)    All other components within normal limits  BASIC METABOLIC PANEL - Abnormal; Notable for the following:    Glucose, Bld 105 (*)    All other components within normal limits  I-STAT TROPOININ, ED    EKG  EKG Interpretation  Date/Time:  Saturday May 26 2016 12:47:30 EDT Ventricular Rate:  57 PR Interval:      QRS Duration: 156 QT Interval:  476 QTC Calculation: 464 R Axis:   62 Text Interpretation:  Sinus rhythm Atrial premature complexes Right bundle branch block Probable lateral infarct, old Baseline wander in lead(s) V1 Confirmed by Lita Mains  MD, DAVID ([id number]) on 05/26/2016 2:23:31 PM       Radiology Dg Chest 2 View  Result Date: 05/26/2016 CLINICAL DATA:  Chest pain since 0800 hours, hypertension, Parkinson's, history melanoma EXAM: CHEST  2 VIEW COMPARISON:  11/24/2014 FINDINGS: Normal heart size, mediastinal contours, and pulmonary vascularity. Atherosclerotic calcifications aorta. Lungs emphysematous but clear. No infiltrate, pleural effusion or pneumothorax. Bones demineralized. IMPRESSION: Emphysematous changes without acute infiltrate. Aortic atherosclerosis. Electronically Signed   By: Lavonia Dana M.D.   On: 05/26/2016 14:24    Procedures Procedures (including critical care time)  Medications Ordered  in ED Medications - No data to display   Initial Impression / Assessment and Plan / ED Course  I have reviewed the triage vital signs and the nursing notes.  Pertinent labs & imaging results that were available during my care of the patient were reviewed by me and considered in my medical decision making (see chart for details).  Clinical Course  - EKG with NSR and multiple PACs not evident on prior. RBBB consistent with prior.  - Troponin negative.  - CBC with mild anemia, hemoglobin 12.2. Improved from prior hemoglobin of 10.2 noted in 04/2015. - BMP with mild hyperglycemia, glucose 105.   Final Clinical Impressions(s) / ED Diagnoses   Final diagnoses:  Chest pain, unspecified type   Given age and presentation with chest pain with EKG changes, placing in observation for chest pain workup. Spoke with Dr. Janne Napoleon with [facility], to bring Gabriel Stewart on observation. Cardiology consulted. Temporary orders placed.  New Prescriptions New Prescriptions   No  medications on file     Lorna Few, [place] 05/26/16 1506    Julianne Rice, MD 05/28/16 [phone]

## 2016-05-27 ENCOUNTER — Observation Stay (HOSPITAL_BASED_OUTPATIENT_CLINIC_OR_DEPARTMENT_OTHER): Payer: Medicare PPO

## 2016-05-27 DIAGNOSIS — I4510 Unspecified right bundle-branch block: Secondary | ICD-10-CM

## 2016-05-27 DIAGNOSIS — I10 Essential (primary) hypertension: Secondary | ICD-10-CM

## 2016-05-27 DIAGNOSIS — G20 Parkinson's disease: Secondary | ICD-10-CM

## 2016-05-27 DIAGNOSIS — R079 Chest pain, unspecified: Secondary | ICD-10-CM | POA: Diagnosis not present

## 2016-05-27 DIAGNOSIS — R1310 Dysphagia, unspecified: Secondary | ICD-10-CM | POA: Diagnosis not present

## 2016-05-27 DIAGNOSIS — Z79899 Other long term (current) drug therapy: Secondary | ICD-10-CM | POA: Diagnosis not present

## 2016-05-27 LAB — TROPONIN I
Troponin I: 0.03 ng/mL (ref <0.03)
Troponin I: <0.03 ng/mL (ref <0.03)

## 2016-05-27 LAB — ECHOCARDIOGRAM COMPLETE
HEIGHTINCHES: 72 in
Weight: 2664 oz

## 2016-05-27 NOTE — Evaluation (Signed)
Clinical/Bedside Swallow Evaluation Patient Details  Name: Gabriel Stewart MRN: [id number] Date of Birth: 1921-10-18  Today's Date: 05/27/2016 Time: SLP Start Time (ACUTE ONLY): 0801 SLP Stop Time (ACUTE ONLY): 0830 SLP Time Calculation (min) (ACUTE ONLY): 29 min  Past Medical History:  Past Medical History:  Diagnosis Date  . Cardiac arrest [facility]) 1970's   "did CPR & revived him"   . Dizzy spells    "frequent"  . Hypertension   . Kidney stone   . Lyme disease [phone]  . Melanoma ([place]) 2010   "back"  . Parkinson's disease ([place])   . Pneumonia    "as a kid"  . Syncope and collapse 11/24/2014   Past Surgical History:  Past Surgical History:  Procedure Laterality Date  . BLADDER STONE REMOVAL  2015  . CATARACT EXTRACTION W/ INTRAOCULAR LENS  IMPLANT, BILATERAL Bilateral 1970  . CIRCUMCISION  2015  . CYSTOSCOPY W/ STONE MANIPULATION     "never got the stone though"  . MELANOMA EXCISION  2010   "back"  . TEMPORAL ARTERY BIOPSY / LIGATION  [id number]   "before dx'd w/Lyme's disease"   HPI:  82 y.o. male admitted with CP.  Hx of Parkinson's dz; dysphagia.  He spends summer months at home in [place], had a fall and sustained SDH last year (?), burr hole drainage, G-tube placed  (has not been used for six months).  CXR no acute infiltrate.  Pt describes great effort to swallow (7/10), but no coughing/choking with PO intake.    Assessment / Plan / Recommendation Clinical Impression  Pt presents with slowed, effortful mastication, with decreased coordination and diffuse residue of solids throughout oral cavity.  Swallow response is likely delayed.  There were no overt s/s of aspiration during today's assessment - lungs are CTA; CXR without infiltrate.  Voice is hypophonic.  Pt presents with a chronic dysphagia, with primary deficits related to oral control and mastication, but there is certainly a propensity for aspiration and a possibility that esophageal dismotility is  contributing to overall symptoms.  SLP will follow for safety/toleration.     Aspiration Risk  Mild aspiration risk;Moderate aspiration risk    Diet Recommendation   regular , thin liquids  Medication Administration: Whole meds with liquid    Other  Recommendations Oral Care Recommendations: Oral care BID   Follow up Recommendations  (tba)      Frequency and Duration min 2x/week  1 week       Prognosis        Swallow Study   General HPI: 82 y.o. male admitted with CP.  Hx of Parkinson's dz; dysphagia.  He spends summer months at home in [place], had a fall and sustained SDH last year (?), burr hole drainage, G-tube placed  (has not been used for six months).  CXR no acute infiltrate.  Pt describes great effort to swallow (7/10), but no coughing/choking with PO intake.  Type of Study: Bedside Swallow Evaluation Previous Swallow Assessment: none per records Diet Prior to this Study: Regular;Thin liquids Temperature Spikes Noted: No Respiratory Status: Nasal cannula History of Recent Intubation: No Behavior/Cognition: Alert;Cooperative;Pleasant mood Oral Cavity Assessment: Within Functional Limits Oral Care Completed by SLP: No Oral Cavity - Dentition: Adequate natural dentition Vision: Functional for self-feeding Self-Feeding Abilities: Able to feed self Patient Positioning: Upright in bed Baseline Vocal Quality: Low vocal intensity Volitional Cough: Strong Volitional Swallow: Able to elicit    Oral/Motor/Sensory Function Overall Oral Motor/Sensory Function: Generalized oral weakness   Ice  Chips Ice chips: Within functional limits   Thin Liquid Thin Liquid: Within functional limits Presentation: Cup;Self Fed    Nectar Thick Nectar Thick Liquid: Not tested   Honey Thick Honey Thick Liquid: Not tested   Puree Puree: Impaired Presentation: Self Fed;Spoon Oral Phase Impairments: Impaired mastication Oral Phase Functional Implications: Prolonged oral transit Pharyngeal  Phase Impairments: Suspected delayed Swallow   Solid   GO   Solid: Impaired Oral Phase Impairments: Impaired mastication Oral Phase Functional Implications: Prolonged oral transit;Impaired mastication Pharyngeal Phase Impairments: Suspected delayed Swallow        Gabriel Stewart 05/27/2016,8:37 AM  Estill Bamberg L. Tivis Ringer, [place] CCC/SLP Pager [phone]

## 2016-05-27 NOTE — Discharge Summary (Signed)
Physician Discharge Summary  Gabriel Stewart [id number] DOB: [birth year] DOA: 05/26/2016  PCP: Gabriel Heck, MD  Admit date: 05/26/2016 Discharge date: 05/27/2016  Admitted From: Home Disposition:  Home  Recommendations for Outpatient Follow-up:  1. Follow up with PCP in 1 week  Discharge Condition: Stable CODE STATUS: DNR Diet recommendation: Regular diet   Brief/Interim Summary:  HPI written by Dr. Lottie Mussel on 05/26/2016  HPI: Gabriel Stewart is a 82 y.o. male Parkinson's disease and hypertension, was sitting at home reading the newspaper when he started noticing midepigastric chest pain with radiation up the anterior aspect of his neck,  that was associated with difficult time swallowing. Denies shortness of breath, diaphoresis, nausea or vomiting.  No syncopal, presyncopal episodes, denies proximal nocturnal dyspnea. Denies any orthopnea. Family gave patient aspirin 325 at home and his pain slowly resolved by the time the ED overnight he was at baseline.  Of note, patient ad a cardiac arrest in the 1970s,  extensive workup at the time,  Had some abnormal EKG changes, but never attributed to her actual "heart attack".  Of note, patient received a G-tube in [place] last year. Wife says she used it for about 3-4 months and has not used it in the last 6 months. He's been eating food at home with additional insurers. However, lately wife is concerned that he may not be getting enough nutrition and is considering going back to his G-tube but she's not certain what to do.   Hospital course:  Chest pain Cardiology, Dr. Marlou Porch reviewed patient. Nothing to add from their perspective. Troponin hand a rise to 0.03 which resolve on repeat draw. Chest pain was resolved today. Sounds like this is secondary to GI etiology rather than heart etiology. Echo stable.  Hypertension Continued lisinopril  Parkinson Disease Continued Sinemet  Dysphagia Evaluated by speech  therapy.   Discharge Diagnoses:  Principal Problem:   Chest pain Active Problems:   Parkinson's disease ([place])   RBBB   Dysphagia   Hypertension    Discharge Instructions     Medication List    TAKE these medications   b complex vitamins capsule Take 1 capsule by mouth daily.   carbidopa-levodopa 25-100 MG tablet Commonly known as:  SINEMET IR Take 2 tablets by mouth 3 (three) times daily.   CENTRUM SILVER tablet Take 1 tablet by mouth daily.   ENSURE Take 237 mLs by mouth. Or Equate, 350 calories each, 5 cans daily   glycopyrrolate 1 MG tablet Commonly known as:  ROBINUL Take 1 tablet (1 mg total) by mouth 3 (three) times daily.   lisinopril 10 MG tablet Commonly known as:  PRINIVIL,ZESTRIL Take 10 mg by mouth daily.   vitamin B-12 1000 MCG tablet Commonly known as:  CYANOCOBALAMIN Take 1,000 mcg by mouth daily.      Follow-up Information    Gabriel Heck, MD. Schedule an appointment as soon as possible for a visit in 1 week(s).   Specialty:  Family Medicine Contact information: [place] [address] [phone]          Allergies  Allergen Reactions  . Novocain [Procaine]     Syncope, "passes out"    Consultations:  Cardiology, Dr. Marlou Porch   Procedures/Studies: Dg Chest 2 View  Result Date: 05/26/2016 CLINICAL DATA:  Chest pain since 0800 hours, hypertension, Parkinson's, history melanoma EXAM: CHEST  2 VIEW COMPARISON:  11/24/2014 FINDINGS: Normal heart size, mediastinal contours, and pulmonary vascularity. Atherosclerotic calcifications aorta. Lungs emphysematous  but clear. No infiltrate, pleural effusion or pneumothorax. Bones demineralized. IMPRESSION: Emphysematous changes without acute infiltrate. Aortic atherosclerosis. Electronically Signed   By: Lavonia Dana M.D.   On: 05/26/2016 14:24     Echocardiogram (05/27/2016) Study Conclusions  - Left ventricle: The cavity size was normal. Wall thickness  was   increased in a pattern of moderate to severe LVH. Systolic   function was normal. The estimated ejection fraction was in the   range of 55% to 60%. Wall motion was normal; there were no   regional wall motion abnormalities. Doppler parameters are   consistent with abnormal left ventricular relaxation (grade 1   diastolic dysfunction). - Aortic valve: Mildly calcified annulus. Trileaflet; mildly   thickened leaflets. Valve area (VTI): 3.91 cm^2. Valve area   (Vmax): 3.57 cm^2. Valve area (Vmean): 3.5 cm^2. - Aorta: Aortic root dimension: 44 mm (ED). - Aortic root: The aortic root was mildly dilated. - Left atrium: The atrium was mildly dilated. - Technically difficult study.   Subjective: Patient reports no chest pain today.  Discharge Exam: Vitals:   05/26/16 2030 05/27/16 0400  BP: 108/63 (!) 101/46  Pulse: 95 89  Resp: 18 20  Temp: 98.4 F (36.9 C) 98.2 F (36.8 C)   Vitals:   05/26/16 1610 05/26/16 1637 05/26/16 2030 05/27/16 0400  BP: 141/56 (!) 165/68 108/63 (!) 101/46  Pulse: 85 75 95 89  Resp: 20  18 20   Temp:  98.3 F (36.8 C) 98.4 F (36.9 C) 98.2 F (36.8 C)  TempSrc:  Oral    SpO2: 100% 98% 97% 96%  Weight:  78.2 kg (172 lb 8 oz)  75.5 kg (166 lb 8 oz)  Height:  6' (1.829 m)      General: Pt is alert, awake, not in acute distress Cardiovascular: RRR, S1/S2 +, no rubs, no gallops Respiratory: CTA bilaterally, no wheezing, no rhonchi Abdominal: Soft, NT, ND, bowel sounds + Extremities: no edema, no cyanosis    The results of significant diagnostics from this hospitalization (including imaging, microbiology, ancillary and laboratory) are listed below for reference.     Microbiology: No results found for this or any previous visit (from the past 240 hour(s)).   Labs: BNP (last 3 results) No results for input(s): BNP in the last 8760 hours. Basic Metabolic Panel:  Recent Labs Lab 05/26/16 1317  NA 140  K 4.3  CL 105  CO2 29  GLUCOSE  105*  BUN 17  CREATININE 0.88  CALCIUM 9.3   Liver Function Tests: No results for input(s): AST, ALT, ALKPHOS, BILITOT, PROT, ALBUMIN in the last 168 hours. No results for input(s): LIPASE, AMYLASE in the last 168 hours. No results for input(s): AMMONIA in the last 168 hours. CBC:  Recent Labs Lab 05/26/16 1317  WBC 8.5  HGB 12.2*  HCT 37.3*  MCV 95.4  PLT 218   Cardiac Enzymes:  Recent Labs Lab 05/26/16 1550 05/26/16 2144 05/27/16 0310 05/27/16 0833  TROPONINI <0.03 <0.03 0.03* <0.03   BNP: Invalid input(s): POCBNP CBG: No results for input(s): GLUCAP in the last 168 hours. D-Dimer No results for input(s): DDIMER in the last 72 hours. Hgb A1c No results for input(s): HGBA1C in the last 72 hours. Lipid Profile No results for input(s): CHOL, HDL, LDLCALC, TRIG, CHOLHDL, LDLDIRECT in the last 72 hours. Thyroid function studies No results for input(s): TSH, T4TOTAL, T3FREE, THYROIDAB in the last 72 hours.  Invalid input(s): FREET3 Anemia work up No results for input(s): VITAMINB12, FOLATE,  FERRITIN, TIBC, IRON, RETICCTPCT in the last 72 hours. Urinalysis No results found for: COLORURINE, APPEARANCEUR, LABSPEC, [place], GLUCOSEU, HGBUR, BILIRUBINUR, KETONESUR, PROTEINUR, UROBILINOGEN, NITRITE, LEUKOCYTESUR Sepsis Labs Invalid input(s): PROCALCITONIN,  WBC,  LACTICIDVEN Microbiology No results found for this or any previous visit (from the past 240 hour(s)).   Time coordinating discharge: Over 30 minutes  SIGNED:   Cordelia Poche, MD [facility] 05/27/2016, 12:28 PM Pager [phone]  If 7PM-7AM, please contact night-coverage [email or web address] Password [email or web address]

## 2016-05-27 NOTE — Progress Notes (Signed)
  Echocardiogram 2D Echocardiogram has been performed.  Roseanna Rainbow R 05/27/2016, 1:10 PM

## 2016-05-27 NOTE — Discharge Instructions (Addendum)
Gabriel Stewart, you were watched overnight for concern of chest pain related to your heart. A cardiologist reviewed your chart and it did not seem your symptoms were associated with a heart issue. We got an EKG and checked your heart enzymes which did not suggest a heart attack. I think your pain may be more related to your esophagus. Please follow-up with your primary care physician.

## 2016-05-29 NOTE — Progress Notes (Signed)
Speech Pathology: late entry from 05/27/16  05/27/16 0800  SLP Visit Information  SLP Received On 05/27/16  Subjective  Subjective pleasant, oriented  General Information  HPI 82 y.o. male admitted with CP.  Hx of Parkinson's dz; dysphagia.  He spends summer months at home in [place], had a fall and sustained SDH last year (?), burr hole drainage, G-tube placed  (has not been used for six months).  CXR no acute infiltrate.  Pt describes great effort to swallow (7/10), but no coughing/choking with PO intake.   Type of Study Bedside Swallow Evaluation  Previous Swallow Assessment none per records  Diet Prior to this Study Regular;Thin liquids  Temperature Spikes Noted No  Respiratory Status Nasal cannula  History of Recent Intubation No  Behavior/Cognition Alert;Cooperative;Pleasant mood  Oral Cavity Assessment WFL  Oral Care Completed by SLP No  Oral Cavity - Dentition Adequate natural dentition  Vision Functional for self-feeding  Self-Feeding Abilities Able to feed self  Patient Positioning Upright in bed  Baseline Vocal Quality Low vocal intensity  Volitional Cough Strong  Volitional Swallow Able to elicit  Oral Motor/Sensory Function  Overall Oral Motor/Sensory Function Generalized oral weakness  Ice Chips  Ice chips WFL  Thin Liquid  Thin Liquid WFL  Presentation Cup;Self Fed  Nectar Thick Liquid  Nectar Thick Liquid NT  Honey Thick Liquid  Honey Thick Liquid NT  Puree  Puree Impaired  Presentation Self Fed;Spoon  Oral Phase Impairments Impaired mastication  Oral Phase Functional Implications Prolonged oral transit  Pharyngeal Phase Impairments Suspected delayed Swallow  Solid  Solid Impaired  Oral Phase Impairments Impaired mastication  Oral Phase Functional Implications Prolonged oral transit;Impaired mastication  Pharyngeal Phase Impairments Suspected delayed Swallow  SLP - End of Session  Patient left in bed;with call bell/phone within reach  Nurse  Communication Diet recommendation  SLP Assessment  Clinical Impression Statement (ACUTE ONLY) Pt presents with slowed, effortful mastication, with decreased coordination and diffuse residue of solids throughout oral cavity.  Swallow response is likely delayed.  There were no overt s/s of aspiration during today's assessment - lungs are CTA; CXR without infiltrate.  Voice is hypophonic.  Pt presents with a chronic dysphagia, with primary deficits related to oral control and mastication, but there is certainly a propensity for aspiration and a possibility that esophageal dismotility is contributing to overall symptoms.  SLP will follow for safety/toleration.   Impact on safety and function Mild aspiration risk;Moderate aspiration risk  Other Related Risk Factors History of dysphagia  Swallow Evaluation Recommendations  SLP Diet Recommendations Thin;Age appropriate regular  Liquid Administration via Cup  Medication Administration Whole meds with liquid  Compensations Slow rate;Small sips/bites  Postural Changes Seated upright at 90 degrees  Treatment Plan  Oral Care Recommendations Oral care BID  Treatment Recommendations Therapy as outlined in treatment plan below  Follow up Recommendations (tba)  Speech Therapy Frequency (ACUTE ONLY) min 2x/week  Treatment Duration 1 week  Interventions Patient/family education;Diet toleration management by SLP  Individuals Consulted  Consulted and Agree with Results and Recommendations Patient  SLP Time Calculation  SLP Start Time (ACUTE ONLY) 0801  SLP Stop Time (ACUTE ONLY) 0830  SLP Time Calculation (min) (ACUTE ONLY) 29 min  SLP G-Codes **NOT FOR INPATIENT CLASS**  Functional Assessment Tool Used clinical judgment  Functional Limitations Swallowing  Swallow Current Status [id number]) CI  Swallow Goal Status [id number]) CI  Swallow Discharge Status [id number]) CI  SLP Evaluations  $ SLP Speech Visit 1 Procedure  SLP Evaluations  $  BSS Swallow 1 Procedure

## 2016-06-05 ENCOUNTER — Ambulatory Visit (INDEPENDENT_AMBULATORY_CARE_PROVIDER_SITE_OTHER): Payer: Medicare PPO | Admitting: Diagnostic Neuroimaging

## 2016-06-05 ENCOUNTER — Encounter: Payer: Self-pay | Admitting: Diagnostic Neuroimaging

## 2016-06-05 VITALS — BP 120/61 | HR 72 | Wt 170.2 lb

## 2016-06-05 DIAGNOSIS — G250 Essential tremor: Secondary | ICD-10-CM

## 2016-06-05 DIAGNOSIS — G20 Parkinson's disease: Secondary | ICD-10-CM

## 2016-06-05 DIAGNOSIS — K117 Disturbances of salivary secretion: Secondary | ICD-10-CM | POA: Diagnosis not present

## 2016-06-05 MED ORDER — GLYCOPYRROLATE 1 MG PO TABS: 1.0000 mg | ORAL_TABLET | Freq: Three times a day (TID) | ORAL | 4 refills | Status: DC

## 2016-06-05 MED ORDER — RASAGILINE MESYLATE 0.5 MG PO TABS: 0.5000 mg | ORAL_TABLET | Freq: Every day | ORAL | 4 refills | Status: DC

## 2016-06-05 MED ORDER — CARBIDOPA-LEVODOPA 25-100 MG PO TABS: 2.0000 | ORAL_TABLET | Freq: Three times a day (TID) | ORAL | 4 refills | Status: DC

## 2016-06-05 NOTE — Patient Instructions (Signed)
-   continue carb/levo  - may taper off robinul  - try rasagiline 0.5mg  daily

## 2016-06-05 NOTE — Progress Notes (Signed)
[facility]  PATIENT: Gabriel Stewart DOB: 25-Dec-1921  REFERRING CLINICIAN: Edwin Dada HISTORY FROM: patient and wife REASON FOR VISIT: follow up   HISTORICAL  CHIEF COMPLAINT:  Chief Complaint  Patient presents with  . Parkinson disease    rm 7, wife- [name], "no new concerns; doing about the same"  . Follow-up    one year    HISTORY OF PRESENT ILLNESS:   UPDATE 06/05/16: Since last visit, no new issues. More gait difficulty and freezing. Had ER visit and admit for chest pain last week.   UPDATE 07/19/15: Since last visit, continues with drooling problem. Tried robinul 1mg  BID, without benefit.   UPDATE 06/06/15: Since last visit, had fall in [place] leading to subdural hematoma, burr hole drainage, SNF recovery, and now back home since last week. Still with drooling, low BP and balance diff. Has ST and PT at home.   UPDATE 12/01/13: Since last visit was doing well until last week (had syncope, due to orthostatic hypotension). Admitted to hosp, now back home. Parkinson's dz sxs stable.   UPDATE 06/29/14: Since last visit, fell once in [place], and in general having more confusion and memory problems. Having more prob with using the computer. Using a cane now. Trying to get back to PT. Some swallow diff.   PRIOR HPI (11/20/13): 82 year old ambidextrous male with hypertension, melanoma skin cancer, here for evaluation of Parkinson's disease. Patient has had essential tremor since 1970s, 1980s, with family history of essential tremor in his own father. Patient's essential tremor was mainly postural tremor, mild action tremor, but did not affect him throughout his life significantly. He was never on medication for essential tremor. 2008 patient developed a new kind of tremor in his left upper extremity, at rest and with action. Handwriting deteriorated. Patient was evaluated and diagnosed with Parkinson's disease. 2 started on carbidopa/levodopa with improvement in tremor  control. She's been managed at [facility] for the past year after moving to [place]. Now wants to establish with local neurologist. Patient has been on carbidopa/levodopa 25/100, 2 tablets 3 times per day. Patient followed up with primary care physician, and possibly due to miscommunication, started taking 2 tablets 4 times a day several days ago. No benefit or adverse effects. Patient was doing quite well on 2 tablets 3 times a day. No on a fluctuations. No wearing off. Patient having some balance difficulty shuffling gait. No falls recently. She's having difficulty swallowing. He has some saliva control issues. Hoarse and soft voice is notable. He has decreased sense of smell and taste. No REM behavior sleep disorder symptoms. He has mild to moderate constipation.  REVIEW OF SYSTEMS: Full 14 system review of systems performed and negative except for: memory loss slurred speech difficulty swallowing tremor decreased energy running nose constipation trouble swallowing.   ALLERGIES: Allergies  Allergen Reactions  . Novocain [Procaine]     Syncope, "passes out"    HOME MEDICATIONS: Outpatient Medications Prior to Visit  Medication Sig Dispense Refill  . carbidopa-levodopa (SINEMET IR) 25-100 MG tablet Take 2 tablets by mouth 3 (three) times daily. 180 tablet 12  . ENSURE (ENSURE) Take 237 mLs by mouth. Or Equate, 350 calories each, 5 cans daily    . glycopyrrolate (ROBINUL) 1 MG tablet Take 1 tablet (1 mg total) by mouth 3 (three) times daily. 270 tablet 1  . lisinopril (PRINIVIL,ZESTRIL) 10 MG tablet Take 10 mg by mouth daily.     . vitamin B-12 (CYANOCOBALAMIN) 1000 MCG tablet  Take 1,000 mcg by mouth daily.     Marland Kitchen b complex vitamins capsule Take 1 capsule by mouth daily.     . Multiple Vitamins-Minerals (CENTRUM SILVER) tablet Take 1 tablet by mouth daily.     No facility-administered medications prior to visit.     PAST MEDICAL HISTORY: Past Medical History:  Diagnosis Date   . Cardiac arrest [facility]) 1970's   "did CPR & revived him"   . Dizzy spells    "frequent"  . Hypertension   . Kidney stone   . Lyme disease [phone]  . Melanoma ([place]) 2010   "back"  . Parkinson's disease ([place])   . Pneumonia    "as a kid"  . Syncope and collapse 11/24/2014    PAST SURGICAL HISTORY: Past Surgical History:  Procedure Laterality Date  . BLADDER STONE REMOVAL  2015  . CATARACT EXTRACTION W/ INTRAOCULAR LENS  IMPLANT, BILATERAL Bilateral 1970  . CIRCUMCISION  2015  . CYSTOSCOPY W/ STONE MANIPULATION     "never got the stone though"  . MELANOMA EXCISION  2010   "back"  . TEMPORAL ARTERY BIOPSY / LIGATION  [id number]   "before dx'd w/Lyme's disease"    FAMILY HISTORY: Family History  Problem Relation Age of Onset  . Appendicitis Brother   . Pneumonia Brother     SOCIAL HISTORY:  Social History   Social History  . Marital status: Married    Spouse name: [name]  . Number of children: 3  . Years of education: 31   Occupational History  .      retired   Social History Main Topics  . Smoking status: Former Smoker    Packs/day: 1.00    Years: 5.00    Types: Cigarettes  . Smokeless tobacco: Never Used     Comment: quit in 1960  . Alcohol use 4.2 oz/week    7 Shots of liquor per week     Comment: 1 shots bourbon daily  . Drug use: No  . Sexual activity: Not Currently   Other Topics Concern  . Not on file   Social History Narrative   Patient resides with wife, can write with hands     PHYSICAL EXAM  Vitals:   06/05/16 1256  BP: 120/61  Pulse: 72  Weight: 170 lb 3.2 oz (77.2 kg)    Not recorded     Body mass index is 23.08 kg/m.  GENERAL EXAM: Patient is in no distress; well developed, nourished and groomed; neck is supple  CARDIOVASCULAR: Regular rate and rhythm, no murmurs, no carotid bruits  NEUROLOGIC: MENTAL STATUS: awake, alert, language fluent, comprehension intact, naming intact, fund of knowledge appropriate CRANIAL  NERVE: pupils equal and reactive to light, visual fields full to confrontation, extraocular muscles intact, no nystagmus, facial sensation and strength symmetric, hearing intact, palate elevates symmetrically, uvula midline, shoulder shrug symmetric, tongue midline. HOARSE VOICE. SOFT VOICE. MOUTH TREMOR MOTOR: normal bulk and tone, MINIMAL POSTURAL TREMOR. NO RIGIDITY. MILD BRADYKINESIA IN LUE AND LLE. Full strength in the BUE, BLE SENSORY: normal and symmetric to light touch; DECR VIB AT TOES (<5SEC). COORDINATION: finger-nose-finger, fine finger movements normal REFLEXES: BUE TRACE, KNEES TRACE, ANKLES 0 GAIT/STATION: SLOW TO RISE, SHORT STEPS, MILD EN BLOC TURNING. USES ROLLATOR.    DIAGNOSTIC DATA (LABS, IMAGING, TESTING) - I reviewed patient records, labs, notes, testing and imaging myself where available.  Lab Results  Component Value Date   WBC 8.5 05/26/2016      Component Value Date/Time  NA 140 05/26/2016 1317   NA 138 05/17/2015   No results found for: CHOL No results found for: HGBA1C No results found for: VITAMINB12 Lab Results  Component Value Date   TSH 1.220 11/24/2014      ASSESSMENT AND PLAN  83 y.o. year old male here with long history of essential tremor, with new-onset Parkinson's disease around 2008. On carbidopa/levodopa, with progression of confusion and gait difficulty. More BP fluctuations, falls, subdural hematoma (Aug 2016).   Dx: parkinson's disease (akinetic/rigid) + dysautonomia + essential tremor  Parkinson disease ([place])  Sialorrhea  Essential tremor    PLAN: I spent 15 minutes of face to face time with patient. Greater than 50% of time was spent in counseling and coordination of care with patient. In summary we discussed:  - continue robinul 1mg  TID for drooling; may try tapering off to see if effective - continue carb/levo (25/100) 2 tabs TID  - START RASAGILINE 0.5MG  DAILY - use walker for balance - monitor low BP; patient does  not want pressors or volume expanders at this time - follow up with social worker regarding long term care  Meds ordered this encounter  Medications  . carbidopa-levodopa (SINEMET IR) 25-100 MG tablet    Sig: Take 2 tablets by mouth 3 (three) times daily.    Dispense:  540 tablet    Refill:  4  . glycopyrrolate (ROBINUL) 1 MG tablet    Sig: Take 1 tablet (1 mg total) by mouth 3 (three) times daily.    Dispense:  270 tablet    Refill:  4  . rasagiline (AZILECT) 0.5 MG TABS tablet    Sig: Take 1 tablet (0.5 mg total) by mouth daily.    Dispense:  90 tablet    Refill:  4   Return in about 1 year (around 06/05/2017).    Penni Bombard, MD [id number], [id number] PM Certified in Neurology, Neurophysiology and Neuroimaging  [facility] [address] [place], [place] [address] [phone]

## 2016-11-20 ENCOUNTER — Ambulatory Visit
Admission: RE | Admit: 2016-11-20 | Discharge: 2016-11-20 | Disposition: A | Discharge status: Home / Self Care | Payer: Medicare PPO | Source: Ambulatory Visit | Attending: Family Medicine | Admitting: Family Medicine

## 2016-11-20 ENCOUNTER — Other Ambulatory Visit: Payer: Self-pay | Admitting: Family Medicine

## 2016-11-20 DIAGNOSIS — R079 Chest pain, unspecified: Secondary | ICD-10-CM

## 2017-06-05 ENCOUNTER — Encounter: Payer: Self-pay | Admitting: Diagnostic Neuroimaging

## 2017-06-05 ENCOUNTER — Ambulatory Visit: Payer: Medicare PPO | Admitting: Diagnostic Neuroimaging

## 2017-06-05 VITALS — BP 130/69 | HR 80 | Ht 72.0 in | Wt 167.2 lb

## 2017-06-05 DIAGNOSIS — G20 Parkinson's disease: Secondary | ICD-10-CM

## 2017-06-05 MED ORDER — GLYCOPYRROLATE 1 MG PO TABS: 1.0000 mg | ORAL_TABLET | Freq: Three times a day (TID) | ORAL | 4 refills | Status: AC

## 2017-06-05 MED ORDER — CARBIDOPA-LEVODOPA 25-100 MG PO TABS: 2.0000 | ORAL_TABLET | Freq: Three times a day (TID) | ORAL | 4 refills | Status: AC

## 2017-06-05 NOTE — Progress Notes (Addendum)
[facility]  PATIENT: Gabriel Stewart DOB: 10-03-1921  REFERRING CLINICIAN: Edwin Dada HISTORY FROM: patient and wife REASON FOR VISIT: follow up   HISTORICAL  CHIEF COMPLAINT:  Chief Complaint  Patient presents with  . Follow-up  . PD    Pt is weaker now, using wheelchair.  difficulty with swallowing, (diet of  ensure mostly, small amount food in evening), robinul no change in drooling, Asking for DME prescription for wheelchair or rollator w/seat.    HISTORY OF PRESENT ILLNESS:   UPDATE (06/05/17, VRP): Since last visit, gait is worsening about the same. Tolerating meds. No alleviating or aggravating factors. Wife asking for transport chair / rollator walker. Drooling continues.  UPDATE 06/05/16: Since last visit, no new issues. More gait difficulty and freezing. Had ER visit and admit for chest pain last week.   UPDATE 07/19/15: Since last visit, continues with drooling problem. Tried robinul 1mg  BID, without benefit.   UPDATE 06/06/15: Since last visit, had fall in [place] leading to subdural hematoma, burr hole drainage, SNF recovery, and now back home since last week. Still with drooling, low BP and balance diff. Has ST and PT at home.   UPDATE 12/01/13: Since last visit was doing well until last week (had syncope, due to orthostatic hypotension). Admitted to hosp, now back home. Parkinson's dz sxs stable.   UPDATE 06/29/14: Since last visit, fell once in [place], and in general having more confusion and memory problems. Having more prob with using the computer. Using a cane now. Trying to get back to PT. Some swallow diff.   PRIOR HPI (11/20/13): 83 year old ambidextrous male with hypertension, melanoma skin cancer, here for evaluation of Parkinson's disease. Patient has had essential tremor since 1970s, 1980s, with family history of essential tremor in his own father. Patient's essential tremor was mainly postural tremor, mild action tremor, but did not affect  him throughout his life significantly. He was never on medication for essential tremor. 2008 patient developed a new kind of tremor in his left upper extremity, at rest and with action. Handwriting deteriorated. Patient was evaluated and diagnosed with Parkinson's disease. 2 started on carbidopa/levodopa with improvement in tremor control. She's been managed at [facility] for the past year after moving to [place]. Now wants to establish with local neurologist. Patient has been on carbidopa/levodopa 25/100, 2 tablets 3 times per day. Patient followed up with primary care physician, and possibly due to miscommunication, started taking 2 tablets 4 times a day several days ago. No benefit or adverse effects. Patient was doing quite well on 2 tablets 3 times a day. No on a fluctuations. No wearing off. Patient having some balance difficulty shuffling gait. No falls recently. She's having difficulty swallowing. He has some saliva control issues. Hoarse and soft voice is notable. He has decreased sense of smell and taste. No REM behavior sleep disorder symptoms. He has mild to moderate constipation.  REVIEW OF SYSTEMS: Full 14 system review of systems performed and negative except for: speech diff incontinence drooling walking diff.    ALLERGIES: Allergies  Allergen Reactions  . Novocain [Procaine]     Syncope, "passes out"    HOME MEDICATIONS: Outpatient Medications Prior to Visit  Medication Sig Dispense Refill  . carbidopa-levodopa (SINEMET IR) 25-100 MG tablet Take 2 tablets by mouth 3 (three) times daily. 540 tablet 4  . ENSURE (ENSURE) Take 237 mLs by mouth. Or Equate, 350 calories each, 5 cans daily    . glycopyrrolate (ROBINUL) 1 MG  tablet Take 1 tablet (1 mg total) by mouth 3 (three) times daily. 270 tablet 4  . lisinopril (PRINIVIL,ZESTRIL) 10 MG tablet Take 10 mg daily as needed by mouth.     . rasagiline (AZILECT) 0.5 MG TABS tablet Take 1 tablet (0.5 mg total) by mouth daily.  90 tablet 4  . vitamin B-12 (CYANOCOBALAMIN) 1000 MCG tablet Take 1,000 mcg by mouth daily.      No facility-administered medications prior to visit.     PAST MEDICAL HISTORY: Past Medical History:  Diagnosis Date  . Cardiac arrest [facility]) 1970's   "did CPR & revived him"   . Dizzy spells    "frequent"  . Hypertension   . Kidney stone   . Lyme disease [phone]  . Melanoma ([place]) 2010   "back"  . Parkinson's disease ([place])   . Pneumonia    "as a kid"  . Syncope and collapse 11/24/2014    PAST SURGICAL HISTORY: Past Surgical History:  Procedure Laterality Date  . BLADDER STONE REMOVAL  2015  . CATARACT EXTRACTION W/ INTRAOCULAR LENS  IMPLANT, BILATERAL Bilateral 1970  . CIRCUMCISION  2015  . CYSTOSCOPY W/ STONE MANIPULATION     "never got the stone though"  . MELANOMA EXCISION  2010   "back"  . TEMPORAL ARTERY BIOPSY / LIGATION  [id number]   "before dx'd w/Lyme's disease"    FAMILY HISTORY: Family History  Problem Relation Age of Onset  . Appendicitis Brother   . Pneumonia Brother     SOCIAL HISTORY:  Social History   Socioeconomic History  . Marital status: Married    Spouse name: [name]  . Number of children: 3  . Years of education: 51  . Highest education level: Not on file  Social Needs  . Financial resource strain: Not on file  . Food insecurity - worry: Not on file  . Food insecurity - inability: Not on file  . Transportation needs - medical: Not on file  . Transportation needs - non-medical: Not on file  Occupational History    Comment: retired  Tobacco Use  . Smoking status: Former Smoker    Packs/day: 1.00    Years: 5.00    Pack years: 5.00    Types: Cigarettes  . Smokeless tobacco: Never Used  . Tobacco comment: quit in 1960  Substance and Sexual Activity  . Alcohol use: Yes    Alcohol/week: 4.2 oz    Types: 7 Shots of liquor per week    Comment: 1 shots bourbon daily  . Drug use: No  . Sexual activity: Not Currently  Other Topics  Concern  . Not on file  Social History Narrative   Patient resides with wife, can write with hands     PHYSICAL EXAM  Vitals:   06/05/17 1342  BP: 130/69  Pulse: 80  Weight: 167 lb 3.2 oz (75.8 kg)  Height: 6' (1.829 m)    Not recorded     Body mass index is 22.68 kg/m.  GENERAL EXAM: Patient is in no distress; well developed, nourished and groomed; neck is supple  CARDIOVASCULAR: Regular rate and rhythm, no murmurs, no carotid bruits  NEUROLOGIC: MENTAL STATUS: awake, alert, FLUENCY DECREASED; comprehension intact, naming intact, fund of knowledge appropriate CRANIAL NERVE: pupils equal and reactive to light, visual fields full to confrontation, extraocular muscles intact, no nystagmus, facial sensation and strength symmetric, hearing intact, palate elevates symmetrically, uvula midline, shoulder shrug symmetric, tongue midline. VERY HOARSE VOICE. SEVERE DYSARTHRIA. MOUTH TREMOR MOTOR: normal  bulk and tone, MINIMAL POSTURAL TREMOR. NO RIGIDITY. MILD BRADYKINESIA IN LUE AND LLE. Full strength in the BUE, BLE SENSORY: normal and symmetric to light touch; DECR VIB AT TOES COORDINATION: finger-nose-finger, fine finger movements normal REFLEXES: BUE TRACE, KNEES TRACE, ANKLES 0 GAIT/STATION: IN WHEEL CHAIR    DIAGNOSTIC DATA (LABS, IMAGING, TESTING) - I reviewed patient records, labs, notes, testing and imaging myself where available.  Lab Results  Component Value Date   WBC 8.5 05/26/2016      Component Value Date/Time   NA 140 05/26/2016 1317   NA 138 05/17/2015   No results found for: CHOL No results found for: HGBA1C No results found for: VITAMINB12 Lab Results  Component Value Date   TSH 1.220 11/24/2014      ASSESSMENT AND PLAN  83 y.o. year old male here with long history of essential tremor, with new-onset Parkinson's disease around 2008. On carbidopa/levodopa, with progression of confusion and gait difficulty. More BP fluctuations, falls, subdural  hematoma (Aug 2016).   Dx: SEVERE parkinson's disease (akinetic/rigid) + dysautonomia + essential tremor  Parkinson disease ([place]) - Plan: DME Other see comment, DME Other see comment    PLAN:  PARKINSON'S DISEASE - continue carb/levo (25/100) 2 tabs three times a day  - stop rasagiline 0.5mg  daily (not effective) - use walker and wheelchair  - follow up with social worker regarding long term care  SIALORRHEA - continue robinul 1mg  three times a day for drooling - refer to Dr. Krista Blue for botox for sialorrhea  Orders Placed This Encounter  Procedures  . DME Other see comment    Lightweight transport chair; Dx: G20 parkinson's disease  . DME Other see comment    ustep walker; Dx: Parkinson disease ([place]) [G20]   Meds ordered this encounter  Medications  . carbidopa-levodopa (SINEMET IR) 25-100 MG tablet    Sig: Take 2 tablets 3 (three) times daily by mouth.    Dispense:  540 tablet    Refill:  4  . glycopyrrolate (ROBINUL) 1 MG tablet    Sig: Take 1 tablet (1 mg total) 3 (three) times daily by mouth.    Dispense:  270 tablet    Refill:  4   Return in about 1 year (around 06/05/2018).    Penni Bombard, MD 17/79/3903, 0:09 PM Certified in Neurology, Neurophysiology and Neuroimaging  [facility] [address], [place], [place] [address] [phone]

## 2017-07-17 ENCOUNTER — Telehealth: Payer: Self-pay | Admitting: Diagnostic Neuroimaging

## 2017-07-17 NOTE — Telephone Encounter (Signed)
Pt wife has called(informed her I did not see a DPR and explained I could not guarantee she could get a call back) she asked for the status of Botox for pt since he is chocking on his saliva and spits and coughs all day.  Pt and spouse are in 42's and it is difficult to get to office but wife said she will try to get here to do a DPR.  Please call with status of Botox

## 2017-07-19 ENCOUNTER — Emergency Department (HOSPITAL_COMMUNITY): Payer: Medicare PPO

## 2017-07-19 ENCOUNTER — Encounter (HOSPITAL_COMMUNITY): Payer: Self-pay | Admitting: Emergency Medicine

## 2017-07-19 ENCOUNTER — Other Ambulatory Visit: Payer: Self-pay

## 2017-07-19 DIAGNOSIS — Z961 Presence of intraocular lens: Secondary | ICD-10-CM | POA: Diagnosis present

## 2017-07-19 DIAGNOSIS — R739 Hyperglycemia, unspecified: Secondary | ICD-10-CM | POA: Diagnosis present

## 2017-07-19 DIAGNOSIS — I10 Essential (primary) hypertension: Secondary | ICD-10-CM | POA: Diagnosis present

## 2017-07-19 DIAGNOSIS — G20 Parkinson's disease: Secondary | ICD-10-CM | POA: Diagnosis present

## 2017-07-19 DIAGNOSIS — Z9842 Cataract extraction status, left eye: Secondary | ICD-10-CM

## 2017-07-19 DIAGNOSIS — Z931 Gastrostomy status: Secondary | ICD-10-CM

## 2017-07-19 DIAGNOSIS — J690 Pneumonitis due to inhalation of food and vomit: Secondary | ICD-10-CM | POA: Diagnosis present

## 2017-07-19 DIAGNOSIS — Z9841 Cataract extraction status, right eye: Secondary | ICD-10-CM

## 2017-07-19 DIAGNOSIS — E43 Unspecified severe protein-calorie malnutrition: Secondary | ICD-10-CM | POA: Diagnosis present

## 2017-07-19 DIAGNOSIS — Z87891 Personal history of nicotine dependence: Secondary | ICD-10-CM

## 2017-07-19 DIAGNOSIS — T380X5A Adverse effect of glucocorticoids and synthetic analogues, initial encounter: Secondary | ICD-10-CM | POA: Diagnosis present

## 2017-07-19 DIAGNOSIS — R1310 Dysphagia, unspecified: Secondary | ICD-10-CM | POA: Diagnosis present

## 2017-07-19 DIAGNOSIS — R0602 Shortness of breath: Secondary | ICD-10-CM | POA: Diagnosis not present

## 2017-07-19 DIAGNOSIS — A419 Sepsis, unspecified organism: Secondary | ICD-10-CM | POA: Diagnosis not present

## 2017-07-19 DIAGNOSIS — Z6822 Body mass index (BMI) 22.0-22.9, adult: Secondary | ICD-10-CM

## 2017-07-19 DIAGNOSIS — Z8674 Personal history of sudden cardiac arrest: Secondary | ICD-10-CM

## 2017-07-19 DIAGNOSIS — Z515 Encounter for palliative care: Secondary | ICD-10-CM | POA: Diagnosis present

## 2017-07-19 DIAGNOSIS — B9789 Other viral agents as the cause of diseases classified elsewhere: Secondary | ICD-10-CM | POA: Diagnosis present

## 2017-07-19 DIAGNOSIS — Z85820 Personal history of malignant melanoma of skin: Secondary | ICD-10-CM

## 2017-07-19 DIAGNOSIS — Z79899 Other long term (current) drug therapy: Secondary | ICD-10-CM

## 2017-07-19 DIAGNOSIS — J209 Acute bronchitis, unspecified: Secondary | ICD-10-CM | POA: Diagnosis present

## 2017-07-19 DIAGNOSIS — Z66 Do not resuscitate: Secondary | ICD-10-CM | POA: Diagnosis present

## 2017-07-19 LAB — COMPREHENSIVE METABOLIC PANEL
ALK PHOS: 105 U/L (ref 38–126)
ALT: 10 U/L — AB (ref 17–63)
AST: 21 U/L (ref 15–41)
Albumin: 3.5 g/dL (ref 3.5–5.0)
Anion gap: 8 (ref 5–15)
BILIRUBIN TOTAL: 0.8 mg/dL (ref 0.3–1.2)
BUN: 25 mg/dL — AB (ref 6–20)
CALCIUM: 9.5 mg/dL (ref 8.9–10.3)
CHLORIDE: 101 mmol/L (ref 101–111)
CO2: 30 mmol/L (ref 22–32)
CREATININE: 0.87 mg/dL (ref 0.61–1.24)
Glucose, Bld: 160 mg/dL — ABNORMAL HIGH (ref 65–99)
Potassium: 4.1 mmol/L (ref 3.5–5.1)
Sodium: 139 mmol/L (ref 135–145)
TOTAL PROTEIN: 7.4 g/dL (ref 6.5–8.1)

## 2017-07-19 LAB — CBC WITH DIFFERENTIAL/PLATELET
Basophils Absolute: 0 10*3/uL (ref 0.0–0.1)
Basophils Relative: 0 %
Eosinophils Absolute: 0 10*3/uL (ref 0.0–0.7)
Eosinophils Relative: 0 %
HEMATOCRIT: 38.2 % — AB (ref 39.0–52.0)
HEMOGLOBIN: 12 g/dL — AB (ref 13.0–17.0)
LYMPHS ABS: 1 10*3/uL (ref 0.7–4.0)
LYMPHS PCT: 9 %
MCH: 30.4 pg (ref 26.0–34.0)
MCHC: 31.4 g/dL (ref 30.0–36.0)
MCV: 96.7 fL (ref 78.0–100.0)
Monocytes Absolute: 0.5 10*3/uL (ref 0.1–1.0)
Monocytes Relative: 4 %
NEUTROS ABS: 9.4 10*3/uL — AB (ref 1.7–7.7)
Neutrophils Relative %: 87 %
Platelets: 288 10*3/uL (ref 150–400)
RBC: 3.95 MIL/uL — AB (ref 4.22–5.81)
RDW: 14 % (ref 11.5–15.5)
WBC: 10.9 10*3/uL — AB (ref 4.0–10.5)

## 2017-07-19 LAB — I-STAT TROPONIN, ED: TROPONIN I, POC: 0.02 ng/mL (ref 0.00–0.08)

## 2017-07-19 MED ORDER — ALBUTEROL SULFATE (2.5 MG/3ML) 0.083% IN NEBU
5.0000 mg | INHALATION_SOLUTION | Freq: Once | RESPIRATORY_TRACT | Status: AC
  Administered 2017-07-19: 5 mg via RESPIRATORY_TRACT
  Filled 2017-07-19: qty 6

## 2017-07-19 NOTE — Telephone Encounter (Signed)
Called wife and advised her per Dr Leta Baptist that there is no other medication to prescribe for his saliva issue. Botox is the other alternative. Advised her this RN will route to Dr Krista Blue and her RN next week when office reopens. Advised she may call to check on Botox referral if she hasn't heard back in several days. Ms Barth verbalized understanding,  appreciation. She also stated that she is patient's [facility] POA and will bring in papers when she Is able.

## 2017-07-19 NOTE — Telephone Encounter (Signed)
Pt's wife called back. She said it has been 6 wks since pt was seen, she is very anxious for pt to have botox. In looking at last OV notes Dr Mamie Nick wanted refer to Dr. Krista Blue for botox for sialorrhea, it does not look like a referral has been made. In the meantime she is wanting to know if there is a medication he could try to help with the salvia. Please call to advise today please.

## 2017-07-19 NOTE — ED Triage Notes (Signed)
Patient reports worsening SOB with chest congestion , wheezing and productive cough onset this week , denies fever or chills .

## 2017-07-19 NOTE — Telephone Encounter (Signed)
Called wife to advise robinul is for saliva, and she stated he has been on it a long time. It is not as effective, and he is choking on his saliva at times. This RN advised will discuss with Dr Leta Baptist. Also advised her will discuss Botox referral to Dr Krista Blue, and call her back. She verbalized understanding, appreciation.

## 2017-07-20 ENCOUNTER — Other Ambulatory Visit: Payer: Self-pay

## 2017-07-20 ENCOUNTER — Inpatient Hospital Stay (HOSPITAL_COMMUNITY)
Admission: EM | Admit: 2017-07-20 | Discharge: 2017-07-25 | DRG: 871 | Disposition: A | Discharge status: Skilled Nursing Facility | Payer: Medicare PPO | Attending: Internal Medicine | Admitting: Internal Medicine

## 2017-07-20 ENCOUNTER — Inpatient Hospital Stay (HOSPITAL_COMMUNITY): Payer: Medicare PPO

## 2017-07-20 DIAGNOSIS — R059 Cough, unspecified: Secondary | ICD-10-CM | POA: Diagnosis present

## 2017-07-20 DIAGNOSIS — Z66 Do not resuscitate: Secondary | ICD-10-CM

## 2017-07-20 DIAGNOSIS — G20 Parkinson's disease: Secondary | ICD-10-CM | POA: Diagnosis not present

## 2017-07-20 DIAGNOSIS — Z789 Other specified health status: Secondary | ICD-10-CM

## 2017-07-20 DIAGNOSIS — Z9842 Cataract extraction status, left eye: Secondary | ICD-10-CM | POA: Diagnosis not present

## 2017-07-20 DIAGNOSIS — R0602 Shortness of breath: Secondary | ICD-10-CM | POA: Insufficient documentation

## 2017-07-20 DIAGNOSIS — Z9189 Other specified personal risk factors, not elsewhere classified: Secondary | ICD-10-CM | POA: Diagnosis not present

## 2017-07-20 DIAGNOSIS — A419 Sepsis, unspecified organism: Secondary | ICD-10-CM

## 2017-07-20 DIAGNOSIS — R531 Weakness: Secondary | ICD-10-CM

## 2017-07-20 DIAGNOSIS — R062 Wheezing: Secondary | ICD-10-CM

## 2017-07-20 DIAGNOSIS — J208 Acute bronchitis due to other specified organisms: Secondary | ICD-10-CM | POA: Diagnosis not present

## 2017-07-20 DIAGNOSIS — J690 Pneumonitis due to inhalation of food and vomit: Secondary | ICD-10-CM | POA: Diagnosis not present

## 2017-07-20 DIAGNOSIS — Z6822 Body mass index (BMI) 22.0-22.9, adult: Secondary | ICD-10-CM | POA: Diagnosis not present

## 2017-07-20 DIAGNOSIS — I10 Essential (primary) hypertension: Secondary | ICD-10-CM

## 2017-07-20 DIAGNOSIS — T17908D Unspecified foreign body in respiratory tract, part unspecified causing other injury, subsequent encounter: Secondary | ICD-10-CM

## 2017-07-20 DIAGNOSIS — T17908A Unspecified foreign body in respiratory tract, part unspecified causing other injury, initial encounter: Secondary | ICD-10-CM | POA: Diagnosis present

## 2017-07-20 DIAGNOSIS — R05 Cough: Secondary | ICD-10-CM | POA: Diagnosis present

## 2017-07-20 DIAGNOSIS — Z87891 Personal history of nicotine dependence: Secondary | ICD-10-CM | POA: Diagnosis not present

## 2017-07-20 DIAGNOSIS — Z515 Encounter for palliative care: Secondary | ICD-10-CM | POA: Diagnosis not present

## 2017-07-20 DIAGNOSIS — T380X5A Adverse effect of glucocorticoids and synthetic analogues, initial encounter: Secondary | ICD-10-CM | POA: Diagnosis present

## 2017-07-20 DIAGNOSIS — E440 Moderate protein-calorie malnutrition: Secondary | ICD-10-CM | POA: Diagnosis not present

## 2017-07-20 DIAGNOSIS — B9789 Other viral agents as the cause of diseases classified elsewhere: Secondary | ICD-10-CM | POA: Diagnosis present

## 2017-07-20 DIAGNOSIS — Z09 Encounter for follow-up examination after completed treatment for conditions other than malignant neoplasm: Secondary | ICD-10-CM

## 2017-07-20 DIAGNOSIS — J189 Pneumonia, unspecified organism: Secondary | ICD-10-CM

## 2017-07-20 DIAGNOSIS — Z961 Presence of intraocular lens: Secondary | ICD-10-CM | POA: Diagnosis present

## 2017-07-20 DIAGNOSIS — Z9841 Cataract extraction status, right eye: Secondary | ICD-10-CM | POA: Diagnosis not present

## 2017-07-20 DIAGNOSIS — E43 Unspecified severe protein-calorie malnutrition: Secondary | ICD-10-CM | POA: Diagnosis not present

## 2017-07-20 DIAGNOSIS — R1310 Dysphagia, unspecified: Secondary | ICD-10-CM | POA: Diagnosis not present

## 2017-07-20 DIAGNOSIS — R739 Hyperglycemia, unspecified: Secondary | ICD-10-CM | POA: Diagnosis present

## 2017-07-20 DIAGNOSIS — Z79899 Other long term (current) drug therapy: Secondary | ICD-10-CM | POA: Diagnosis not present

## 2017-07-20 DIAGNOSIS — Z5189 Encounter for other specified aftercare: Secondary | ICD-10-CM

## 2017-07-20 DIAGNOSIS — Z8674 Personal history of sudden cardiac arrest: Secondary | ICD-10-CM | POA: Diagnosis not present

## 2017-07-20 DIAGNOSIS — J209 Acute bronchitis, unspecified: Secondary | ICD-10-CM | POA: Diagnosis present

## 2017-07-20 DIAGNOSIS — Z85820 Personal history of malignant melanoma of skin: Secondary | ICD-10-CM | POA: Diagnosis not present

## 2017-07-20 DIAGNOSIS — Z931 Gastrostomy status: Secondary | ICD-10-CM | POA: Diagnosis not present

## 2017-07-20 LAB — RESPIRATORY PANEL BY PCR
ADENOVIRUS-RVPPCR: NOT DETECTED
BORDETELLA PERTUSSIS-RVPCR: NOT DETECTED
CHLAMYDOPHILA PNEUMONIAE-RVPPCR: NOT DETECTED
CORONAVIRUS HKU1-RVPPCR: NOT DETECTED
CORONAVIRUS NL63-RVPPCR: NOT DETECTED
Coronavirus 229E: NOT DETECTED
Coronavirus OC43: NOT DETECTED
INFLUENZA A-RVPPCR: NOT DETECTED
Influenza B: NOT DETECTED
MYCOPLASMA PNEUMONIAE-RVPPCR: NOT DETECTED
Metapneumovirus: NOT DETECTED
Parainfluenza Virus 1: NOT DETECTED
Parainfluenza Virus 2: NOT DETECTED
Parainfluenza Virus 3: NOT DETECTED
Parainfluenza Virus 4: NOT DETECTED
Respiratory Syncytial Virus: NOT DETECTED
Rhinovirus / Enterovirus: DETECTED — AB

## 2017-07-20 LAB — URINALYSIS, ROUTINE W REFLEX MICROSCOPIC
BILIRUBIN URINE: NEGATIVE
Glucose, UA: NEGATIVE mg/dL
HGB URINE DIPSTICK: NEGATIVE
KETONES UR: NEGATIVE mg/dL
Leukocytes, UA: NEGATIVE
Nitrite: NEGATIVE
PH: 7 (ref 5.0–8.0)
Protein, ur: NEGATIVE mg/dL
SPECIFIC GRAVITY, URINE: 1.017 (ref 1.005–1.030)

## 2017-07-20 LAB — LACTIC ACID, PLASMA
LACTIC ACID, VENOUS: 1.6 mmol/L (ref 0.5–1.9)
LACTIC ACID, VENOUS: 1.1 mmol/L (ref 0.5–1.9)

## 2017-07-20 LAB — STREP PNEUMONIAE URINARY ANTIGEN: Strep Pneumo Urinary Antigen: NEGATIVE

## 2017-07-20 LAB — PROCALCITONIN: Procalcitonin: <0.1 ng/mL

## 2017-07-20 LAB — INFLUENZA PANEL BY PCR (TYPE A & B)
INFLBPCR: NEGATIVE
Influenza A By PCR: NEGATIVE

## 2017-07-20 LAB — HIV ANTIBODY (ROUTINE TESTING W REFLEX): HIV SCREEN 4TH GENERATION: NONREACTIVE

## 2017-07-20 MED ORDER — GUAIFENESIN 100 MG/5ML PO SOLN: 200.0000 mg | ORAL | Status: DC | PRN

## 2017-07-20 MED ORDER — IPRATROPIUM-ALBUTEROL 0.5-2.5 (3) MG/3ML IN SOLN: 3.0000 mL | Freq: Three times a day (TID) | RESPIRATORY_TRACT | Status: DC

## 2017-07-20 MED ORDER — SODIUM CHLORIDE 0.9 % IV BOLUS (SEPSIS)
1000.0000 mL | Freq: Once | INTRAVENOUS | Status: AC
  Administered 2017-07-20: 1000 mL via INTRAVENOUS

## 2017-07-20 MED ORDER — CLINDAMYCIN PHOSPHATE 600 MG/50ML IV SOLN
600.0000 mg | Freq: Once | INTRAVENOUS | Status: AC
  Administered 2017-07-20: 600 mg via INTRAVENOUS
  Filled 2017-07-20: qty 50

## 2017-07-20 MED ORDER — VITAMIN B-12 1000 MCG PO TABS
1000.0000 ug | ORAL_TABLET | Freq: Every day | ORAL | Status: DC
  Administered 2017-07-20 – 2017-07-22 (×3): 1000 ug via ORAL
  Filled 2017-07-20 (×3): qty 1

## 2017-07-20 MED ORDER — ALBUTEROL SULFATE (2.5 MG/3ML) 0.083% IN NEBU
5.0000 mg | INHALATION_SOLUTION | Freq: Once | RESPIRATORY_TRACT | Status: AC
  Administered 2017-07-20: 5 mg via RESPIRATORY_TRACT
  Filled 2017-07-20: qty 6

## 2017-07-20 MED ORDER — ENOXAPARIN SODIUM 40 MG/0.4ML ~~LOC~~ SOLN
40.0000 mg | SUBCUTANEOUS | Status: DC
  Administered 2017-07-20 – 2017-07-24 (×5): 40 mg via SUBCUTANEOUS
  Filled 2017-07-20 (×5): qty 0.4

## 2017-07-20 MED ORDER — CARBIDOPA-LEVODOPA 25-100 MG PO TABS
2.0000 | ORAL_TABLET | Freq: Three times a day (TID) | ORAL | Status: DC
  Administered 2017-07-20 – 2017-07-22 (×7): 2 via ORAL
  Filled 2017-07-20 (×7): qty 2

## 2017-07-20 MED ORDER — CLINDAMYCIN PHOSPHATE 600 MG/50ML IV SOLN: 600.0000 mg | Freq: Three times a day (TID) | INTRAVENOUS | Status: DC

## 2017-07-20 MED ORDER — HYDRALAZINE HCL 20 MG/ML IJ SOLN: 5.0000 mg | INTRAMUSCULAR | Status: DC | PRN

## 2017-07-20 MED ORDER — ORAL CARE MOUTH RINSE
15.0000 mL | Freq: Two times a day (BID) | OROMUCOSAL | Status: DC
  Administered 2017-07-20 – 2017-07-25 (×10): 15 mL via OROMUCOSAL

## 2017-07-20 MED ORDER — SODIUM CHLORIDE 0.9 % IV SOLN
3.0000 g | Freq: Four times a day (QID) | INTRAVENOUS | Status: DC
  Administered 2017-07-20 – 2017-07-23 (×13): 3 g via INTRAVENOUS
  Filled 2017-07-20 (×15): qty 3

## 2017-07-20 MED ORDER — SODIUM CHLORIDE 0.9 % IV SOLN
INTRAVENOUS | Status: DC
  Administered 2017-07-20 – 2017-07-22 (×4): via INTRAVENOUS

## 2017-07-20 MED ORDER — RESOURCE THICKENUP CLEAR PO POWD
ORAL | Status: DC | PRN
  Filled 2017-07-20: qty 125

## 2017-07-20 MED ORDER — LISINOPRIL 5 MG PO TABS
5.0000 mg | ORAL_TABLET | Freq: Every day | ORAL | Status: DC
  Administered 2017-07-20: 5 mg via ORAL
  Filled 2017-07-20: qty 1

## 2017-07-20 MED ORDER — ALBUTEROL SULFATE (2.5 MG/3ML) 0.083% IN NEBU: 5.0000 mg | INHALATION_SOLUTION | RESPIRATORY_TRACT | Status: DC | PRN

## 2017-07-20 MED ORDER — METHYLPREDNISOLONE SODIUM SUCC 125 MG IJ SOLR
125.0000 mg | Freq: Once | INTRAMUSCULAR | Status: AC
  Administered 2017-07-20: 125 mg via INTRAVENOUS
  Filled 2017-07-20: qty 2

## 2017-07-20 MED ORDER — ZOLPIDEM TARTRATE 5 MG PO TABS: 5.0000 mg | ORAL_TABLET | Freq: Every evening | ORAL | Status: DC | PRN

## 2017-07-20 MED ORDER — GLYCOPYRROLATE 1 MG PO TABS
1.0000 mg | ORAL_TABLET | Freq: Three times a day (TID) | ORAL | Status: DC
  Administered 2017-07-20 – 2017-07-22 (×7): 1 mg via ORAL
  Filled 2017-07-20 (×8): qty 1

## 2017-07-20 MED ORDER — ADULT MULTIVITAMIN W/MINERALS CH
1.0000 | ORAL_TABLET | Freq: Every day | ORAL | Status: DC
  Administered 2017-07-21 – 2017-07-22 (×2): 1 via ORAL
  Filled 2017-07-20 (×2): qty 1

## 2017-07-20 MED ORDER — METHYLPREDNISOLONE SODIUM SUCC 40 MG IJ SOLR
40.0000 mg | Freq: Three times a day (TID) | INTRAMUSCULAR | Status: DC
  Administered 2017-07-20 – 2017-07-21 (×4): 40 mg via INTRAVENOUS
  Filled 2017-07-20 (×4): qty 1

## 2017-07-20 MED ORDER — ONDANSETRON HCL 4 MG/2ML IJ SOLN: 4.0000 mg | Freq: Three times a day (TID) | INTRAMUSCULAR | Status: DC | PRN

## 2017-07-20 MED ORDER — IPRATROPIUM-ALBUTEROL 0.5-2.5 (3) MG/3ML IN SOLN
3.0000 mL | RESPIRATORY_TRACT | Status: DC
  Administered 2017-07-20: 3 mL via RESPIRATORY_TRACT
  Filled 2017-07-20: qty 3

## 2017-07-20 MED ORDER — ENSURE ENLIVE PO LIQD
237.0000 mL | Freq: Two times a day (BID) | ORAL | Status: DC
  Administered 2017-07-20 – 2017-07-21 (×3): 237 mL via ORAL

## 2017-07-20 MED ORDER — IPRATROPIUM-ALBUTEROL 0.5-2.5 (3) MG/3ML IN SOLN
3.0000 mL | Freq: Four times a day (QID) | RESPIRATORY_TRACT | Status: DC
  Administered 2017-07-20: 3 mL via RESPIRATORY_TRACT
  Filled 2017-07-20: qty 3

## 2017-07-20 MED ORDER — ACETAMINOPHEN 650 MG RE SUPP
650.0000 mg | Freq: Four times a day (QID) | RECTAL | Status: DC | PRN
Start: 2017-07-20 — End: 2017-07-22

## 2017-07-20 NOTE — H&P (Signed)
History and Physical    Gabriel Stewart:[id number] DOB: 15-Apr-1922 DOA: 07/20/2017  Referring MD/NP/PA:   PCP: Leighton Ruff, MD   Patient coming from:  The patient is coming from home.  At baseline, pt is independent for most of ADL.  Chief Complaint: Cough and shortness of breath  HPI: Gabriel Stewart is a 83 y.o. male with medical history significant of Parkinson's disease, difficulty swallowing, s/p of feeding tube, cardiac arrest, syncope, right bundle blockage, melanoma, who presents with cough and shortness of breath.  Patient states that he has been having cough and shortness breath for about 1 week, which has been progressively getting worse. He coughs up yellow colored sputum. He has runny nose, but no sore throat. No fever or chills. Patient does not have chest pain. No tenderness in the calf areas. Patient does not have nausea, vomiting, diarrhea, abdominal pain, symptoms of UTI. No unilateral weakness.  ED Course: pt was found to have WBC 10.9, electrolytes renal function okay, tachycardia, tachypnea, oxygen saturation 95% on 2 L nasal cannula oxygen, negative chest x-ray. Patient is admitted to telemetry bed as inpatient.  Review of Systems:   General: no fevers, chills, no body weight gain, has poor appetite, has fatigue HEENT: no blurry vision, hearing changes or sore throat Respiratory: has dyspnea, coughing, wheezing CV: no chest pain, no palpitations GI: no nausea, vomiting, abdominal pain, diarrhea, constipation GU: no dysuria, burning on urination, increased urinary frequency, hematuria  Ext: no leg edema Neuro: no unilateral weakness, numbness, or tingling, no vision change or hearing loss Skin: no rash, no skin tear. MSK: No muscle spasm, no deformity, no limitation of range of movement in spin Heme: No easy bruising.  Travel history: No recent long distant travel.  Allergy:  Allergies  Allergen Reactions  . Novocain [Procaine]     Syncope,  "passes out"    Past Medical History:  Diagnosis Date  . Cardiac arrest [facility]) 1970's   "did CPR & revived him"   . Dizzy spells    "frequent"  . Hypertension   . Kidney stone   . Lyme disease [phone]  . Melanoma ([place]) 2010   "back"  . Parkinson's disease ([place])   . Pneumonia    "as a kid"  . Syncope and collapse 11/24/2014    Past Surgical History:  Procedure Laterality Date  . BLADDER STONE REMOVAL  2015  . CATARACT EXTRACTION W/ INTRAOCULAR LENS  IMPLANT, BILATERAL Bilateral 1970  . CIRCUMCISION  2015  . CYSTOSCOPY W/ STONE MANIPULATION     "never got the stone though"  . MELANOMA EXCISION  2010   "back"  . TEMPORAL ARTERY BIOPSY / LIGATION  5621-3086'V   "before dx'd w/Lyme's disease"    Social History:  reports that he has quit smoking. His smoking use included cigarettes. He has a 5.00 pack-year smoking history. he has never used smokeless tobacco. He reports that he drinks about 4.2 oz of alcohol per week. He reports that he does not use drugs.  Family History:  Family History  Problem Relation Age of Onset  . Appendicitis Brother   . Pneumonia Brother      Prior to Admission medications   Medication Sig Start Date End Date Taking? Authorizing Provider  carbidopa-levodopa (SINEMET IR) 25-100 MG tablet Take 2 tablets 3 (three) times daily by mouth. 06/05/17   Penumalli, Earlean Polka, MD  ENSURE (ENSURE) Take 237 mLs by mouth. Or Equate, 350 calories each, 5 cans daily  [provider]  glycopyrrolate (ROBINUL) 1 MG tablet Take 1 tablet (1 mg total) 3 (three) times daily by mouth. 06/05/17   Penumalli, Earlean Polka, MD  lisinopril (PRINIVIL,ZESTRIL) 10 MG tablet Take 10 mg daily as needed by mouth.  09/15/14   [provider]  vitamin B-12 (CYANOCOBALAMIN) 1000 MCG tablet Take 1,000 mcg by mouth daily.     [provider]    Physical Exam: Vitals:   07/20/17 0115 07/20/17 0200 07/20/17 0245 07/20/17 0412  BP: 110/60 (!) 133/58 (!)  100/53 (!) 118/48  Pulse: 90 97 89 92  Resp: 19 (!) 21 18 (!) 21  Temp:    98.4 F (36.9 C)  TempSrc:    Oral  SpO2: 95% 97% 100% 94%  Weight: 75.8 kg (167 lb 1.7 oz)   74 kg (163 lb 1 oz)  Height: 6' (1.829 m)   6' (1.829 m)   General: Not in acute distress HEENT:       Eyes: PERRL, EOMI, no scleral icterus.       ENT: No discharge from the ears and nose, no pharynx injection, no tonsillar enlargement.        Neck: No JVD, no bruit, no mass felt. Heme: No neck lymph node enlargement. Cardiac: S1/S2, RRR, No murmurs, No gallops or rubs. Respiratory: Has coarse breathing sound and diffused wheezing bilaterally  GI: Soft, nondistended, nontender, no rebound pain, no organomegaly, BS present. S/p of feeding tube. GU: No hematuria Ext: No pitting leg edema bilaterally. 2+DP/PT pulse bilaterally. Musculoskeletal: No joint deformities, No joint redness or warmth, no limitation of ROM in spin. Skin: No rashes.  Neuro: Alert, oriented X3, cranial nerves II-XII grossly intact, moves all extremities normally. Psych: Patient is not psychotic, no suicidal or hemocidal ideation.  Labs on Admission: I have personally reviewed following labs and imaging studies  CBC: Recent Labs  Lab 07/19/17 2008  WBC 10.9*  NEUTROABS 9.4*  HGB 12.0*  HCT 38.2*  MCV 96.7  PLT 960   Basic Metabolic Panel: Recent Labs  Lab 07/19/17 2008  NA 139  K 4.1  CL 101  CO2 30  GLUCOSE 160*  BUN 25*  CREATININE 0.87  CALCIUM 9.5   GFR: Estimated Creatinine Clearance: 55.5 mL/min (by C-G formula based on SCr of 0.87 mg/dL). Liver Function Tests: Recent Labs  Lab 07/19/17 2008  AST 21  ALT 10*  ALKPHOS 105  BILITOT 0.8  PROT 7.4  ALBUMIN 3.5   No results for input(s): LIPASE, AMYLASE in the last 168 hours. No results for input(s): AMMONIA in the last 168 hours. Coagulation Profile: No results for input(s): INR, PROTIME in the last 168 hours. Cardiac Enzymes: No results for input(s): CKTOTAL,  CKMB, CKMBINDEX, TROPONINI in the last 168 hours. BNP (last 3 results) No results for input(s): PROBNP in the last 8760 hours. HbA1C: No results for input(s): HGBA1C in the last 72 hours. CBG: No results for input(s): GLUCAP in the last 168 hours. Lipid Profile: No results for input(s): CHOL, HDL, LDLCALC, TRIG, CHOLHDL, LDLDIRECT in the last 72 hours. Thyroid Function Tests: No results for input(s): TSH, T4TOTAL, FREET4, T3FREE, THYROIDAB in the last 72 hours. Anemia Panel: No results for input(s): VITAMINB12, FOLATE, FERRITIN, TIBC, IRON, RETICCTPCT in the last 72 hours. Urine analysis:    Component Value Date/Time   COLORURINE YELLOW 07/20/2017 0304   APPEARANCEUR HAZY (A) 07/20/2017 0304   LABSPEC 1.017 07/20/2017 0304   PHURINE 7.0 07/20/2017 0304   GLUCOSEU NEGATIVE 07/20/2017  [place] 07/20/2017 0304   BILIRUBINUR NEGATIVE 07/20/2017 0304   KETONESUR NEGATIVE 07/20/2017 0304   PROTEINUR NEGATIVE 07/20/2017 0304   NITRITE NEGATIVE 07/20/2017 0304   LEUKOCYTESUR NEGATIVE 07/20/2017 0304   Sepsis Labs: @LABRCNTIP (procalcitonin:4,lacticidven:4) )No results found for this or any previous visit (from the past 240 hour(s)).   Radiological Exams on Admission: Dg Chest 2 View  Result Date: 07/19/2017 CLINICAL DATA:  Shortness of breath EXAM: CHEST  2 VIEW COMPARISON:  11/20/2016 FINDINGS: No focal pulmonary infiltrate or pleural effusion. Normal cardiomediastinal silhouette with aortic atherosclerosis. No pneumothorax. Mild scarring at the left lung base. Degenerative changes of the spine. IMPRESSION: No active cardiopulmonary disease. Electronically Signed   By: Donavan Foil M.D.   On: 07/19/2017 20:51    EKG: Independently reviewed. Sinus rhythm, QTC 482, right axis deviation, widening QRS, right bundle blockage?  Assessment/Plan Principal Problem:   Aspiration into airway Active Problems:   Parkinson's disease (HCC)   Protein-calorie malnutrition, severe  (HCC)   Hypertension   Cough  Aspiration into airway or aspiration pneumonia vs. acute bronchitis and sepsis: Patient has productive cough, shortness of breath, negative chest x-ray, possibly due to aspiration into airway, or early stage of aspiration pneumonia versus acute bronchitis. Given his history of Parkinson's disease and difficulty swallowing, most likely due to early stage of aspiration pneumonia. Patient meets criteria for sepsis with tachycardia and tachypnea, also mildly elevated WBC. Currently hemodynamically stable. Pending lactic acid level.  - will admit to tele bed as inpt - IV Unasyn ( pt received one dose of clindamycin in ED) - Mucinex for cough  - solumedrol 40 mg tid - prn Albuterol Nebs, and DuoNeb for SOB - Urine legionella and S. pneumococcal antigen - Follow up blood culture x2, sputum culture and respiratory virus panel, plus Flu pcr - will get Procalcitonin and trend lactic acid level per sepsis protocol - IVF: 1L of NS bolus in ED, followed by 75 mL per hour of NS  - SLP  Parkinson's disease ([place]): s/p of feeding tube -continue Senimet  Protein-calorie malnutrition, severe ([place]) -consult to nutrition   Hypertension: -continue lisinopril, decreased dose from 10 to 5 mg daily   DVT ppx:  SQ Lovenox Code Status: Full code (pt had DNR before, but after I discussed with patient and explained the meaning of CODE STATUS. Patient wants to be full code today) Family Communication: None at bed side.      Disposition Plan:  Anticipate discharge back to previous home environment Consults called:  none Admission status:  Inpatient/tele     Date of Service 07/20/2017    Ivor Costa [facility] Pager [phone]  If 7PM-7AM, please contact night-coverage [email or web address] Password [email or web address] 07/20/2017, 5:02 AM

## 2017-07-20 NOTE — Progress Notes (Addendum)
New Admission Note:   Arrival Method: Bed from ED Mental Orientation: Alert and oriented x 4 Telemetry:[address] Assessment: Completed Skin: Intact Pain:0/10 Tubes: feeding tube Safety Measures: Safety Fall Prevention Plan has been discussed  Admission:  3 East Orientation: Patient has been orientated to the room, unit and staff.  Family: none at bedside  Orders to be reviewed and implemented. Call light has been placed within reach and bed alarm has been activated.   Admission history partially completed due to patient having hard time talking, coughing and stating that he is tired and he needs some rest. Will follow up with day shift to complete admission history when wife present of when patient fully awake and rested.    Venetia Night, RN Phone: [phone]

## 2017-07-20 NOTE — Progress Notes (Signed)
Initial Nutrition Assessment  DOCUMENTATION CODES:   Non-severe (moderate) malnutrition in context of chronic illness  INTERVENTION:   Ensure Enlive po BID, each supplement provides 350 kcal and 20 grams of protein  Magic cup TID with meals, each supplement provides 290 kcal and 9 grams of protein  MVI  NUTRITION DIAGNOSIS:   Moderate Malnutrition related to dysphagia, other (see comment)(advanced age) as evidenced by moderate fat depletion, moderate muscle depletion.  GOAL:   Patient will meet greater than or equal to 90% of their needs  MONITOR:   PO intake, Supplement acceptance, Labs, Weight trends, I & O's, Skin  REASON FOR ASSESSMENT:   Consult Assessment of nutrition requirement/status  ASSESSMENT:   83 y.o. male with medical history significant of Parkinson's disease, difficulty swallowing, s/p of feeding tube, cardiac arrest, syncope, right bundle blockage, melanoma, who presents with cough and shortness of breath. Pt found to have aspiration PNA   Met with pt in room today. Pt reports good appetite and oral intake pta. Per chart, pt is weight stable. Pt has a history of dysphagia secondary to his Parkinson's. He has a G-tube which he reports has been in for five years but he has not used it in two years. Pt reports that he has been able to eat fine; however, pt's wife reports that pt is supposed to use thickener but does not like it. Pt does drink chocolate Ensure home. Per chart, his weight is stable. Pt was seen by SLP today; awaiting recommendations. Pt does report difficulty chewing foods and requests a soft diet. RD will order Ensure (thicken as needed) and Magic Cups.    Medications reviewed and include: lovenox, solu-medrol, B12, NaCl _0 /hr, unasyn  Labs reviewed: BUN 25(H)  Nutrition-Focused physical exam completed. Findings are moderate fat and muscle depletions over entire body, and no edema.   Diet Order:  Diet NPO time specified Except for: Sips  with Meds, Ice Chips  EDUCATION NEEDS:   Education needs have been addressed  Skin:  Reviewed RN Assessment  Last BM:  12/28  Height:   Ht Readings from Last 1 Encounters:  07/20/17 6' (1.829 m)    Weight:   Wt Readings from Last 1 Encounters:  07/20/17 163 lb 1 oz (74 kg)    Ideal Body Weight:  80.9 kg  BMI:  Body mass index is 22.12 kg/m.  Estimated Nutritional Needs:   Kcal:  1850-2150kcal/day   Protein:  111-126g/day   Fluid:  >2L/day  Koleen Distance MS, RD, LDN Pager #[phone] After Hours Pager: [phone]

## 2017-07-20 NOTE — Progress Notes (Addendum)
Pharmacy Antibiotic Note  Gabriel Stewart is a 83 y.o. male admitted on 07/20/2017 with suspected aspiration pneumonia.  Pharmacy has been consulted for Unasyn dosing.  Tmax 98.4, WBC 10.9, LA 1.6. SCr 0.87 for estimated CrCl ~ 50 mL/min.   Plan: Unasyn 3g IV q6hr Monitor renal function, clinical picture, and culture data F/u length of therapy   Height: 6' (182.9 cm) Weight: 163 lb 1 oz (74 kg) IBW/kg (Calculated) : 77.6  Temp (24hrs), Avg:98.3 F (36.8 C), Min:98.2 F (36.8 C), Max:98.4 F (36.9 C)  Recent Labs  Lab 07/19/17 2008 07/20/17 0240  WBC 10.9*  --   CREATININE 0.87  --   LATICACIDVEN  --  1.6    Estimated Creatinine Clearance: 55.5 mL/min (by C-G formula based on SCr of 0.87 mg/dL).    Allergies  Allergen Reactions  . Novocain [Procaine]     Syncope, "passes out"    Lavonda Jumbo, PharmD Clinical Pharmacist 07/20/17 6:14 AM

## 2017-07-20 NOTE — ED Provider Notes (Signed)
[place] [facility] EMERGENCY DEPARTMENT Provider Note   CSN: [id number] Arrival date & time: 07/19/17  1951     History   Chief Complaint Chief Complaint  Patient presents with  . Shortness of Breath    HPI DELYLE WEIDER is a 83 y.o. male.  The history is provided by the patient and medical records.    83 y.o. M with hx of HTN, Lyme disease, kidney stones, parkinson's disease, syncope, presenting to the ED for SOB.  He was seen by PCP earlier today and sent here for further management.  Wife reports he has been very SOB with wet, productive cough for the past week.  Wife states he seems to be declining.  With his history of parkinsons he has trouble swallowing, especially when eating/drinking.  He has feeding tube in place, however he still tries to eat/drink by mouth.  Wife states they have tried using thickening agents, however patient does not like it.  Since he has had decreased intake recently, they have been using ensure to supplement.  States he has gotten choked up recently a few times.  Wife states he has been wheezing a great deal and seems to be struggling to breath more and more.  He was given some breathing treatments in clinic today but no big improvement seen.  Patient denies any chest pain, abdominal pain, nausea/vomiting, diarrhea.  Past Medical History:  Diagnosis Date  . Cardiac arrest [facility]) 1970's   "did CPR & revived him"   . Dizzy spells    "frequent"  . Hypertension   . Kidney stone   . Lyme disease [phone]  . Melanoma ([place]) 2010   "back"  . Parkinson's disease ([place])   . Pneumonia    "as a kid"  . Syncope and collapse 11/24/2014    Patient Active Problem List   Diagnosis Date Noted  . Chest pain 05/26/2016  . Parkinson disease ([place]) 07/19/2015  . Sialorrhea 07/19/2015  . Subdural hematoma S/P Craniotomy and drainage 05/17/2015  . Dysphagia 05/17/2015  . Hypertension 05/17/2015  . BPH (benign prostatic hyperplasia) 05/17/2015    . Protein-calorie malnutrition, severe ([place]) 11/25/2014  . Syncope 11/24/2014  . RBBB 11/24/2014  . Orthostatic hypotension 11/24/2014  . Dehydration 11/24/2014  . Unintentional weight loss 11/24/2014  . Syncope and collapse 11/24/2014  . Parkinson's disease ([place]) 11/20/2013  . Essential tremor 11/20/2013  . Gait difficulty 11/20/2013  . Swallowing difficulty 11/20/2013  . Hoarse voice quality 11/20/2013    Past Surgical History:  Procedure Laterality Date  . BLADDER STONE REMOVAL  2015  . CATARACT EXTRACTION W/ INTRAOCULAR LENS  IMPLANT, BILATERAL Bilateral 1970  . CIRCUMCISION  2015  . CYSTOSCOPY W/ STONE MANIPULATION     "never got the stone though"  . MELANOMA EXCISION  2010   "back"  . TEMPORAL ARTERY BIOPSY / LIGATION  1025-8527'P   "before dx'd w/Lyme's disease"       Home Medications    Prior to Admission medications   Medication Sig Start Date End Date Taking? Authorizing Provider  carbidopa-levodopa (SINEMET IR) 25-100 MG tablet Take 2 tablets 3 (three) times daily by mouth. 06/05/17   Penumalli, Earlean Polka, MD  ENSURE (ENSURE) Take 237 mLs by mouth. Or Equate, 350 calories each, 5 cans daily    [provider]  glycopyrrolate (ROBINUL) 1 MG tablet Take 1 tablet (1 mg total) 3 (three) times daily by mouth. 06/05/17   Penumalli, Earlean Polka, MD  lisinopril (PRINIVIL,ZESTRIL) 10 MG tablet  Take 10 mg daily as needed by mouth.  09/15/14   [provider]  vitamin B-12 (CYANOCOBALAMIN) 1000 MCG tablet Take 1,000 mcg by mouth daily.     [provider]    Family History Family History  Problem Relation Age of Onset  . Appendicitis Brother   . Pneumonia Brother     Social History Social History   Tobacco Use  . Smoking status: Former Smoker    Packs/day: 1.00    Years: 5.00    Pack years: 5.00    Types: Cigarettes  . Smokeless tobacco: Never Used  . Tobacco comment: quit in 1960  Substance Use Topics  . Alcohol use: Yes     Alcohol/week: 4.2 oz    Types: 7 Shots of liquor per week    Comment: 1 shots bourbon daily  . Drug use: No     Allergies   Novocain [procaine]   Review of Systems Review of Systems  Respiratory: Positive for cough and shortness of breath.   All other systems reviewed and are negative.    Physical Exam Updated Vital Signs BP 134/70   Pulse 95   Temp 98.2 F (36.8 C) (Oral)   Resp 19   SpO2 97%   Physical Exam  Constitutional: He is oriented to person, place, and time. He appears well-developed and well-nourished.  Elderly, chronically ill appearing  HENT:  Head: Normocephalic and atraumatic.  Mouth/Throat: Oropharynx is clear and moist.  Very dry mucous membranes, difficulty handling secretions but maintaining airway  Eyes: Conjunctivae and EOM are normal. Pupils are equal, round, and reactive to light.  Neck: Normal range of motion.  Cardiovascular: Normal rate, regular rhythm and normal heart sounds.  Pulmonary/Chest: Effort normal. He has no decreased breath sounds. He has wheezes.  Significant wheezes with intermixed rhonchi, primarily in upper lobes; very wet cough  Abdominal: Soft. Bowel sounds are normal.  Musculoskeletal: Normal range of motion.  Neurological: He is alert and oriented to person, place, and time.  Skin: Skin is warm and dry.  Psychiatric: He has a normal mood and affect.  Nursing note and vitals reviewed.    ED Treatments / Results  Labs (all labs ordered are listed, but only abnormal results are displayed) Labs Reviewed  CBC WITH DIFFERENTIAL/PLATELET - Abnormal; Notable for the following components:      Result Value   WBC 10.9 (*)    RBC 3.95 (*)    Hemoglobin 12.0 (*)    HCT 38.2 (*)    Neutro Abs 9.4 (*)    All other components within normal limits  COMPREHENSIVE METABOLIC PANEL - Abnormal; Notable for the following components:   Glucose, Bld 160 (*)    BUN 25 (*)    ALT 10 (*)    All other components within normal limits    RESPIRATORY PANEL BY PCR  CULTURE, BLOOD (ROUTINE X 2)  CULTURE, BLOOD (ROUTINE X 2)  CULTURE, EXPECTORATED SPUTUM-ASSESSMENT  GRAM STAIN  URINALYSIS, ROUTINE W REFLEX MICROSCOPIC  INFLUENZA PANEL BY PCR (TYPE A & B)  HIV ANTIBODY (ROUTINE TESTING)  STREP PNEUMONIAE URINARY ANTIGEN  LACTIC ACID, PLASMA  LACTIC ACID, PLASMA  PROCALCITONIN  I-STAT TROPONIN, ED    EKG  EKG Interpretation  Date/Time:  Friday July 19 2017 19:58:59 EST Ventricular Rate:  103 PR Interval:  174 QRS Duration: 148 QT Interval:  368 QTC Calculation: 482 R Axis:   107 Text Interpretation:  Sinus tachycardia Rightward axis Non-specific intra-ventricular conduction block Inferior infarct ,  age undetermined Abnormal ECG Confirmed by Pryor Curia [id number]) on 07/20/2017 12:58:07 AM       Radiology Dg Chest 2 View  Result Date: 07/19/2017 CLINICAL DATA:  Shortness of breath EXAM: CHEST  2 VIEW COMPARISON:  11/20/2016 FINDINGS: No focal pulmonary infiltrate or pleural effusion. Normal cardiomediastinal silhouette with aortic atherosclerosis. No pneumothorax. Mild scarring at the left lung base. Degenerative changes of the spine. IMPRESSION: No active cardiopulmonary disease. Electronically Signed   By: Donavan Foil M.D.   On: 07/19/2017 20:51    Procedures Procedures (including critical care time)  Medications Ordered in ED Medications  albuterol (PROVENTIL) (2.5 MG/3ML) 0.083% nebulizer solution 5 mg (not administered)  methylPREDNISolone sodium succinate (SOLU-MEDROL) 40 mg/mL injection 40 mg (not administered)  carbidopa-levodopa (SINEMET IR) 25-100 MG per tablet immediate release 2 tablet (not administered)  glycopyrrolate (ROBINUL) tablet 1 mg (not administered)  lisinopril (PRINIVIL,ZESTRIL) tablet 5 mg (not administered)  vitamin B-12 (CYANOCOBALAMIN) tablet 1,000 mcg (not administered)  ipratropium-albuterol (DUONEB) 0.5-2.5 (3) MG/3ML nebulizer solution 3 mL (not administered)   guaifenesin (ROBITUSSIN) 100 MG/5ML syrup 200 mg (not administered)  clindamycin (CLEOCIN) IVPB 600 mg (not administered)  enoxaparin (LOVENOX) injection 40 mg (not administered)  ondansetron (ZOFRAN) injection 4 mg (not administered)  hydrALAZINE (APRESOLINE) injection 5 mg (not administered)  acetaminophen (TYLENOL) suppository 650 mg (not administered)  zolpidem (AMBIEN) tablet 5 mg (not administered)  sodium chloride 0.9 % bolus 1,000 mL (1,000 mLs Intravenous New Bag/Given 07/20/17 0251)  0.9 %  sodium chloride infusion ( Intravenous New Bag/Given 07/20/17 0253)  albuterol (PROVENTIL) (2.5 MG/3ML) 0.083% nebulizer solution 5 mg (5 mg Nebulization Given 07/19/17 2008)  methylPREDNISolone sodium succinate (SOLU-MEDROL) 125 mg/2 mL injection 125 mg (125 mg Intravenous Given 07/20/17 0158)  albuterol (PROVENTIL) (2.5 MG/3ML) 0.083% nebulizer solution 5 mg (5 mg Nebulization Given 07/20/17 0159)  clindamycin (CLEOCIN) IVPB 600 mg (0 mg Intravenous Stopped 07/20/17 0234)     Initial Impression / Assessment and Plan / ED Course  I have reviewed the triage vital signs and the nursing notes.  Pertinent labs & imaging results that were available during my care of the patient were reviewed by me and considered in my medical decision making (see chart for details).  83 y.o. M here with SOB.  On exam, patient is chronically ill appearing, audible wheezing bedside.  Does appear to have some intermixed rhonchi.  O2 sats remained at 95% during exam.  Patient has long-standing history of Parkinson's and difficulty handling his secretions at baseline.  He has a very wet sounding cough and wife reports he has gotten "choked up" a lot recently as he continues to want to eat and drink despite having his feeding tube.  He has high risk for aspiration and I suspect he may have developed an aspiration pneumonia that is not yet visible on chest x-ray.  Given his poor appearance, would recommend admission for IV  antibiotics, steroids, and continued nebulizer treatments.  Wife and daughter in agreement with this.  IV clindamycin ordered.  Discussed with hospitalist, Dr. Blaine Hamper-- he will admit for ongoing care.  Final Clinical Impressions(s) / ED Diagnoses   Final diagnoses:  Shortness of breath  Wheezing  At high risk for aspiration  Sepsis [facility])    ED Discharge Orders    None       Larene Pickett, PA-C 07/20/17 0301    Ward, Delice Bison, DO 07/20/17 [phone]

## 2017-07-20 NOTE — Evaluation (Signed)
Clinical/Bedside Swallow Evaluation Patient Details  Name: Gabriel Stewart MRN: [id number] Date of Birth: 09-21-1921  Today's Date: 07/20/2017 Time: SLP Start Time (ACUTE ONLY): 38 SLP Stop Time (ACUTE ONLY): 0940 SLP Time Calculation (min) (ACUTE ONLY): 20 min  Past Medical History:  Past Medical History:  Diagnosis Date  . Cardiac arrest [facility]) 1970's   "did CPR & revived him"   . Dizzy spells    "frequent"  . Hypertension   . Kidney stone   . Lyme disease [phone]  . Melanoma ([place]) 2010   "back"  . Parkinson's disease ([place])   . Pneumonia    "as a kid"  . Syncope and collapse 11/24/2014   Past Surgical History:  Past Surgical History:  Procedure Laterality Date  . BLADDER STONE REMOVAL  2015  . CATARACT EXTRACTION W/ INTRAOCULAR LENS  IMPLANT, BILATERAL Bilateral 1970  . CIRCUMCISION  2015  . CYSTOSCOPY W/ STONE MANIPULATION     "never got the stone though"  . MELANOMA EXCISION  2010   "back"  . TEMPORAL ARTERY BIOPSY / LIGATION  [id number]   "before dx'd w/Lyme's disease"   HPI:  Patient is a 83 y.o. male with PMH: Parkinson's disease, difficulty swallowing, s/p feeding tube, cardiac arrest, syncope, right bundle blockage, melanoma, who presents to hospital with cough and SOB.    Assessment / Plan / Recommendation Clinical Impression  Patient presents with a moderate oropharyngeal dysphagia characterized by decreased mastication and oral manipulation and transit of regular and puree solid textures, delayed swallow initation, throat clearing and delayed coughing with thin liquids. Patient with a G-tube which he says he has had for three years but has not used it in two years. He said his wife cleans it but does not use it for nutrition.  SLP Visit Diagnosis: Dysphagia, oropharyngeal phase (R13.12)    Aspiration Risk  Moderate aspiration risk    Diet Recommendation NPO(NPO except for meds crushed in puree)   Medication Administration: Crushed with puree     Other  Recommendations     Follow up Recommendations        Frequency and Duration min 2x/week  1 week       Prognosis Prognosis for Safe Diet Advancement: Fair Barriers to Reach Goals: Severity of deficits      Swallow Study   General Date of Onset: 07/20/17 HPI: Patient is a 83 y.o. male with PMH: Parkinson's disease, difficulty swallowing, s/p feeding tube, cardiac arrest, syncope, right bundle blockage, melanoma, who presents to hospital with cough and SOB.  Type of Study: Bedside Swallow Evaluation Previous Swallow Assessment: N/A Diet Prior to this Study: NPO Respiratory Status: Room air History of Recent Intubation: No Behavior/Cognition: Alert;Cooperative;Pleasant mood Oral Cavity Assessment: Dried secretions Oral Care Completed by SLP: Yes Oral Cavity - Dentition: Adequate natural dentition Vision: Functional for self-feeding Self-Feeding Abilities: Needs assist;Needs set up Patient Positioning: Upright in bed Baseline Vocal Quality: Aphonic;Low vocal intensity Volitional Cough: Weak Volitional Swallow: Able to elicit    Oral/Motor/Sensory Function Overall Oral Motor/Sensory Function: Moderate impairment Facial ROM: Reduced right;Reduced left Facial Symmetry: Within Functional Limits Lingual ROM: Reduced right;Reduced left Lingual Symmetry: Within Functional Limits Lingual Strength: Reduced   Ice Chips Ice chips: Not tested   Thin Liquid Thin Liquid: Impaired Presentation: Cup Oral Phase Impairments: Reduced labial seal Oral Phase Functional Implications: Prolonged oral transit Pharyngeal  Phase Impairments: Suspected delayed Swallow;Cough - Delayed;Throat Clearing - Immediate;Multiple swallows    Nectar Thick Nectar Thick Liquid: Not tested  Honey Thick Honey Thick Liquid: Not tested   Puree Puree: Impaired Oral Phase Impairments: Reduced lingual movement/coordination Pharyngeal Phase Impairments: Suspected delayed Swallow   Solid   GO   Solid:  Impaired Oral Phase Impairments: Impaired mastication;Reduced lingual movement/coordination Pharyngeal Phase Impairments: Suspected delayed Swallow;Multiple swallows        Sonia Baller, MA, CCC-SLP 07/20/17 2:37 PM

## 2017-07-20 NOTE — Progress Notes (Signed)
Modified Barium Swallow Progress Note  Patient Details  Name: Gabriel Stewart MRN: [id number] Date of Birth: 22-Jun-1922  Today's Date: 07/20/2017  Modified Barium Swallow completed.  Full report located under Chart Review in the Imaging Section.  Brief recommendations include the following:  Clinical Impression  Patient presents with a mod-severe oropharyngeal dysphagia characterized by swallow initiation delays to vallecular sinus for puree, regular and nectar consistencies, and delays to pyriform sinus with thin liquids. Patient with moderate vallecular residuals post initial swallow with puree solids and regular solids, which started to clear (not fully) with cued swallows and use of throat clear and reswallow, as well as chin tuck and reswallow strategies. Patient exhibited penetration and aspiration during and after swallow with thin liquids and did not sense until after penetration or aspiration had already occured. Chin tuck strategy did not help in reducing incidence of penetration or aspiration, but did help with clearance of vallecular sinus and pyriform sinus residuals.    Swallow Evaluation Recommendations       SLP Diet Recommendations: Dysphagia 2 (Fine chop) solids;Nectar thick liquid   Liquid Administration via: Cup;No straw   Medication Administration: Crushed with puree   Supervision: Full assist for feeding;Staff to assist with self feeding;Full supervision/cueing for compensatory strategies   Compensations: Minimize environmental distractions;Slow rate;Small sips/bites;Multiple dry swallows after each bite/sip;Clear throat intermittently;Chin tuck   Postural Changes: Remain semi-upright after after feeds/meals (Comment);Seated upright at 90 degrees   Oral Care Recommendations: Oral care QID   Other Recommendations: Have oral suction available;Order thickener from pharmacy;Prohibited food (jello, ice cream, thin soups);Remove water pitcher   Sonia Baller, MA,  CCC-SLP 07/20/17 3:01 PM

## 2017-07-20 NOTE — Progress Notes (Signed)
PROGRESS NOTE  Gabriel Stewart ZOX:[id number] DOB: April 08, 1922 DOA: 07/20/2017 PCP: Leighton Ruff, MD  Brief History:  83 year old  male with history of Parkinson's disease, dysphagia, hypertension presents with one-week history of coughing and shortness of breath.  The patient is a poor historian secondary to cognitive impairment.  All of the history is obtained from speaking with the patient's spouse and review of the medical record.  She states that the patient has had a gastrostomy tube for approximately 3 years, but she has not used it for over 2 years.  The patient has been eating by mouth during this period of time.  She states that he is supposed to use a thickener, but he does not like using it.  Therefore, they have not been using thickener.  She states that he frequently chokes on food at home but has less difficulty with drinking his Ensure.  There is not been any fevers, chills, vomiting, diarrhea, abdominal pain, chest pain.  Upon presentation, the patient was noted to have WBC 10.9 with clear chest x-ray.  He was started on Unasyn for concerns of aspiration.  Assessment/Plan: Aspiration pneumonitis -Long discussion with the patient's wife--does not appear ready for comfort feedings -Appreciate speech therapy evaluation--> dysphagia 2 diet with nectar thickened liquids -The patient will be a high risk for recurrent aspirations and repeat hospitalizations -Consult palliative medicine for goals of care discussion -Continue Unasyn -Continue IV fluids -Influenza PCR negative -Respiratory viral panel-positive for rhinovirus-likely compounding the patient's current respiratory status -continue robinul  Bronchospasm -Continue duo nebs--increase to every 6 hours -Continue IV Solu-Medrol  Parkinson's disease -Continue Sinemet -Patient follows Dr. Lonni Fix  Severe protein calorie malnutrition -Continue Ensure -consult nutrition  Essential hypertension  -wife  states that patient no longer takes lisinopril -BP remains stable off lisinopril   Disposition Plan:   Home in 2-3 days  Family Communication:  Updated spouse on phone--Total time spent 35 minutes.  Greater than 50% spent face to face counseling and coordinating care.1310 to 1345   Consultants:  Palliative medicine  Code Status:  FULL  DVT Prophylaxis:  [place] Lovenox   Procedures: As Listed in Progress Note Above  Antibiotics: Unasyn 12/29>>    Subjective: Patient states that his breathing is only slightly better.  He continues to have coughing and wheezing.  He denies any nausea, vomiting, diarrhea, abdominal pain, dysuria, hematuria, rashes, headache.  Objective: Vitals:   07/20/17 0412 07/20/17 0815 07/20/17 1056 07/20/17 1202  BP: (!) 118/48  (!) 120/51 124/67  Pulse: 92  82 82  Resp: (!) 21   20  Temp: 98.4 F (36.9 C)   98.2 F (36.8 C)  TempSrc: Oral   Oral  SpO2: 94% 93%  95%  Weight: 74 kg (163 lb 1 oz)     Height: 6' (1.829 m)       Intake/Output Summary (Last 24 hours) at 07/20/2017 1332 Last data filed at 07/20/2017 0612 Gross per 24 hour  Intake 1248.75 ml  Output 0 ml  Net 1248.75 ml   Weight change:  Exam:   General:  Pt is alert, follows commands appropriately, not in acute distress  HEENT: No icterus, No thrush, No neck mass, [place]/AT  Cardiovascular: RRR, S1/S2, no rubs, no gallops  Respiratory: Bilateral scattered rhonchi.  Bilateral wheezing.  Good air movement.  Abdomen: Soft/+BS, non tender, non distended, no guarding  Extremities: No edema, No lymphangitis, No petechiae, No rashes, no synovitis  Data Reviewed: I have personally reviewed following labs and imaging studies Basic Metabolic Panel: Recent Labs  Lab 07/19/17 2008  NA 139  K 4.1  CL 101  CO2 30  GLUCOSE 160*  BUN 25*  CREATININE 0.87  CALCIUM 9.5   Liver Function Tests: Recent Labs  Lab 07/19/17 2008  AST 21  ALT 10*  ALKPHOS 105  BILITOT 0.8  PROT 7.4   ALBUMIN 3.5   No results for input(s): LIPASE, AMYLASE in the last 168 hours. No results for input(s): AMMONIA in the last 168 hours. Coagulation Profile: No results for input(s): INR, PROTIME in the last 168 hours. CBC: Recent Labs  Lab 07/19/17 2008  WBC 10.9*  NEUTROABS 9.4*  HGB 12.0*  HCT 38.2*  MCV 96.7  PLT 288   Cardiac Enzymes: No results for input(s): CKTOTAL, CKMB, CKMBINDEX, TROPONINI in the last 168 hours. BNP: Invalid input(s): POCBNP CBG: No results for input(s): GLUCAP in the last 168 hours. HbA1C: No results for input(s): HGBA1C in the last 72 hours. Urine analysis:    Component Value Date/Time   COLORURINE YELLOW 07/20/2017 0304   APPEARANCEUR HAZY (A) 07/20/2017 0304   LABSPEC 1.017 07/20/2017 0304   PHURINE 7.0 07/20/2017 0304   GLUCOSEU NEGATIVE 07/20/2017 0304   HGBUR NEGATIVE 07/20/2017 0304   BILIRUBINUR NEGATIVE 07/20/2017 0304   KETONESUR NEGATIVE 07/20/2017 0304   PROTEINUR NEGATIVE 07/20/2017 0304   NITRITE NEGATIVE 07/20/2017 0304   LEUKOCYTESUR NEGATIVE 07/20/2017 0304   Sepsis Labs: @LABRCNTIP (procalcitonin:4,lacticidven:4) ) Recent Results (from the past 240 hour(s))  Respiratory Panel by PCR     Status: Abnormal   Collection Time: 07/20/17  2:48 AM  Result Value Ref Range Status   Adenovirus NOT DETECTED NOT DETECTED Final   Coronavirus 229E NOT DETECTED NOT DETECTED Final   Coronavirus HKU1 NOT DETECTED NOT DETECTED Final   Coronavirus NL63 NOT DETECTED NOT DETECTED Final   Coronavirus OC43 NOT DETECTED NOT DETECTED Final   Metapneumovirus NOT DETECTED NOT DETECTED Final   Rhinovirus / Enterovirus DETECTED (A) NOT DETECTED Final   Influenza A NOT DETECTED NOT DETECTED Final   Influenza B NOT DETECTED NOT DETECTED Final   Parainfluenza Virus 1 NOT DETECTED NOT DETECTED Final   Parainfluenza Virus 2 NOT DETECTED NOT DETECTED Final   Parainfluenza Virus 3 NOT DETECTED NOT DETECTED Final   Parainfluenza Virus 4 NOT DETECTED  NOT DETECTED Final   Respiratory Syncytial Virus NOT DETECTED NOT DETECTED Final   Bordetella pertussis NOT DETECTED NOT DETECTED Final   Chlamydophila pneumoniae NOT DETECTED NOT DETECTED Final   Mycoplasma pneumoniae NOT DETECTED NOT DETECTED Final     Scheduled Meds: . carbidopa-levodopa  2 tablet Oral TID  . enoxaparin (LOVENOX) injection  40 mg Subcutaneous Q24H  . glycopyrrolate  1 mg Oral TID  . ipratropium-albuterol  3 mL Nebulization TID  . lisinopril  5 mg Oral Daily  . mouth rinse  15 mL Mouth Rinse BID  . methylPREDNISolone (SOLU-MEDROL) injection  40 mg Intravenous Q8H  . vitamin B-12  1,000 mcg Oral Daily   Continuous Infusions: . sodium chloride 75 mL/hr at 07/20/17 0253  . ampicillin-sulbactam (UNASYN) IV Stopped (07/20/17 1057)    Procedures/Studies: Dg Chest 2 View  Result Date: 07/19/2017 CLINICAL DATA:  Shortness of breath EXAM: CHEST  2 VIEW COMPARISON:  11/20/2016 FINDINGS: No focal pulmonary infiltrate or pleural effusion. Normal cardiomediastinal silhouette with aortic atherosclerosis. No pneumothorax. Mild scarring at the left lung base. Degenerative changes of the spine. IMPRESSION:  No active cardiopulmonary disease. Electronically Signed   By: Donavan Foil M.D.   On: 07/19/2017 20:51    Orson Eva, DO  [facility] Pager [phone]  If 7PM-7AM, please contact night-coverage [email or web address] Password [email or web address] 07/20/2017, 1:32 PM   LOS: 0 days

## 2017-07-21 ENCOUNTER — Inpatient Hospital Stay (HOSPITAL_COMMUNITY): Payer: Medicare PPO

## 2017-07-21 DIAGNOSIS — R531 Weakness: Secondary | ICD-10-CM

## 2017-07-21 DIAGNOSIS — Z515 Encounter for palliative care: Secondary | ICD-10-CM

## 2017-07-21 DIAGNOSIS — E440 Moderate protein-calorie malnutrition: Secondary | ICD-10-CM

## 2017-07-21 LAB — BASIC METABOLIC PANEL
ANION GAP: 8 (ref 5–15)
BUN: 39 mg/dL — ABNORMAL HIGH (ref 6–20)
CALCIUM: 8.7 mg/dL — AB (ref 8.9–10.3)
CO2: 27 mmol/L (ref 22–32)
CREATININE: 0.89 mg/dL (ref 0.61–1.24)
Chloride: 104 mmol/L (ref 101–111)
GLUCOSE: 152 mg/dL — AB (ref 65–99)
Potassium: 4.3 mmol/L (ref 3.5–5.1)
Sodium: 139 mmol/L (ref 135–145)

## 2017-07-21 LAB — CBC
HCT: 29.3 % — ABNORMAL LOW (ref 39.0–52.0)
HEMOGLOBIN: 9.4 g/dL — AB (ref 13.0–17.0)
MCH: 30.7 pg (ref 26.0–34.0)
MCHC: 32.1 g/dL (ref 30.0–36.0)
MCV: 95.8 fL (ref 78.0–100.0)
PLATELETS: 236 10*3/uL (ref 150–400)
RBC: 3.06 MIL/uL — AB (ref 4.22–5.81)
RDW: 14.4 % (ref 11.5–15.5)
WBC: 14.4 10*3/uL — ABNORMAL HIGH (ref 4.0–10.5)

## 2017-07-21 MED ORDER — PREDNISONE 20 MG PO TABS
40.0000 mg | ORAL_TABLET | Freq: Every day | ORAL | Status: DC
  Administered 2017-07-22: 40 mg via ORAL
  Filled 2017-07-21 (×2): qty 2

## 2017-07-21 MED ORDER — IOPAMIDOL (ISOVUE-300) INJECTION 61%
INTRAVENOUS | Status: AC
  Administered 2017-07-21: 30 mL via GASTROSTOMY
  Filled 2017-07-21: qty 50

## 2017-07-21 MED ORDER — IPRATROPIUM-ALBUTEROL 0.5-2.5 (3) MG/3ML IN SOLN
3.0000 mL | Freq: Two times a day (BID) | RESPIRATORY_TRACT | Status: DC
  Administered 2017-07-21 – 2017-07-25 (×8): 3 mL via RESPIRATORY_TRACT
  Filled 2017-07-21 (×8): qty 3

## 2017-07-21 NOTE — Clinical Social Work Note (Signed)
Clinical Social Work Assessment  Patient Details  Name: Gabriel Stewart MRN: [id number] Date of Birth: 20-Feb-1922  Date of referral:  07/21/17               Reason for consult:  Facility Placement, Discharge Planning                Permission sought to share information with:  Facility Sport and exercise psychologist, Family Supports Permission granted to share information::     Name::     Ronda Kazmi  Agency::  SNF's  Relationship::  Spouse  Contact Information:  [phone]  Housing/Transportation Living arrangements for the past 2 months:  [place] of Information:  Patient, Medical Team, Adult Children, Spouse Patient Interpreter Needed:  None Criminal Activity/Legal Involvement Pertinent to Current Situation/Hospitalization:  No - Comment as needed Significant Relationships:  Adult Children, Spouse Lives with:  Spouse Do you feel safe going back to the place where you live?  Yes Need for family participation in patient care:  Yes (Comment)  Care giving concerns:  Patient's wife interested in SNF placement.   Social Worker assessment / plan:  Patient only oriented to self. Wife at bedside. CSW introduced role and explained that discharge planning would be discussed. Patient's wife confirmed interest in SNF and that [facility] is first preference. PT/OT evaluations pending. Will seek bed placement at [facility] and start insurance authorization when 1-2 days away from discharge. No further concerns. CSW encouraged patient's wife to contact CSW as needed. CSW will continue to follow patient and his wife for support and facilitate discharge to SNF, if recommended and approved, once medically stable.  Employment status:  Retired Nurse, adult PT Recommendations:  Not assessed at this time Information / Referral to community resources:  [place]  Patient/Family's Response to care:  Patient not fully oriented. Patient's wife  agreeable to SNF placement. Patient's family supportive and involved in patient's care. Patient's wife appreciated social work intervention.  Patient/Family's Understanding of and Emotional Response to Diagnosis, Current Treatment, and Prognosis:  Patient not fully oriented. Patient's wife has a good understanding of the reason for admission and is hopeful for SNF recommendation. Patient's wife appears happy with hospital care.  Emotional Assessment Appearance:  Appears stated age Attitude/Demeanor/Rapport:  Unable to Assess Affect (typically observed):  Unable to Assess Orientation:  Oriented to Self Alcohol / Substance use:  Never Used Psych involvement (Current and /or in the community):  No (Comment)  Discharge Needs  Concerns to be addressed:  Care Coordination Readmission within the last 30 days:  No Current discharge risk:  Cognitively Impaired Barriers to Discharge:  Continued Medical Work up, Ship broker, Other(PT/OT evaluations pending)   Candie Chroman, LCSW 07/21/2017, 2:27 PM

## 2017-07-21 NOTE — Progress Notes (Addendum)
PROGRESS NOTE  Gabriel Stewart QPY:[id number] DOB: 15-Apr-1922 DOA: 07/20/2017 PCP: Leighton Ruff, MD  Brief History:  83 year old  male with history of Parkinson's disease, dysphagia, hypertension presents with one-week history of coughing and shortness of breath.  The patient is a poor historian secondary to cognitive impairment.  All of the history is obtained from speaking with the patient's spouse and review of the medical record.  She states that the patient has had a gastrostomy tube for approximately 3 years, but she has not used it for over 2 years.  The patient has been eating by mouth during this period of time.  She states that he is supposed to use a thickener, but he does not like using it.  Therefore, they have not been using thickener.  She states that he frequently chokes on food at home but has less difficulty with drinking his Ensure.  There is not been any fevers, chills, vomiting, diarrhea, abdominal pain, chest pain.  Upon presentation, the patient was noted to have WBC 10.9 with clear chest x-ray.  He was started on Unasyn for concerns of aspiration.  Due to some respiratory distress and bronchospasm, the patient was also started on IV steroids which has been de-escalated to p.o. prednisone.  Assessment/Plan: Aspiration pneumonitis -Long discussion with the patient's wife--does not appear ready for comfort feedings/comfort care -Appreciate speech therapy evaluation--> dysphagia 2 diet with nectar thickened liquids -The patient will be a high risk for recurrent aspirations and repeat hospitalizations -Consult palliative medicine for goals of care discussion -Continue Unasyn -Continue IV fluids -Influenza PCR negative -Respiratory viral panel-positive for rhinovirus-likely compounding the patient's current respiratory status -continue robinul  Bronchospasm -Continue duo nebs -de-escalate IV Solu-Medrol>>>po prednisone once daily and wean  Parkinson's  disease -Continue Sinemet -Patient follows Dr. Leta Baptist  Moderate protein calorie malnutrition -Continue Ensure -consult nutrition -still has poor po intake, with concerns regarding patient's ability to sustain adequate po intake in the setting of aspiration  Essential hypertension  -wife states that patient no longer takes lisinopril -BP remains stable off lisinopril  Hyperglycemia -due to steroids    Disposition Plan:   SNF 12/31 or 07/23/17  Family Communication:  No family present   Consultants:  Palliative medicine  Code Status:  FULL  DVT Prophylaxis:  [place] Lovenox   Procedures: As Listed in Progress Note Above  Antibiotics: Unasyn 12/29>>       Subjective: Patient is breathing better.  He states that his cough is feeling "drier".  He denies any headache, chest pain, shortness breath, nausea, vomiting, diarrhea, abdominal pain.  No headache or neck pain.  Objective: Vitals:   07/20/17 1453 07/21/17 0527 07/21/17 0704 07/21/17 0724  BP:  (!) 117/47    Pulse:  73    Resp:  18    Temp:  98.5 F (36.9 C)    TempSrc:  Oral    SpO2: 95% 93%  95%  Weight:   74.9 kg (165 lb 1.6 oz)   Height:        Intake/Output Summary (Last 24 hours) at 07/21/2017 1128 Last data filed at 07/21/2017 0315 Gross per 24 hour  Intake 1973.75 ml  Output 550 ml  Net 1423.75 ml   Weight change:  Exam:   General:  Pt is alert, follows commands appropriately, not in acute distress  HEENT: No icterus, No thrush, No neck mass, [place]/AT  Cardiovascular: RRR, S1/S2, no rubs, no gallops  Respiratory:  bibasilar  rales.  Mild bibasilar wheeze.  Abdomen: Soft/+BS, non tender, non distended, no guarding  Extremities: No edema, No lymphangitis, No petechiae, No rashes, no synovitis   Data Reviewed: I have personally reviewed following labs and imaging studies Basic Metabolic Panel: Recent Labs  Lab 07/19/17 2008 07/21/17 0436  NA 139 139  K 4.1 4.3  CL 101  104  CO2 30 27  GLUCOSE 160* 152*  BUN 25* 39*  CREATININE 0.87 0.89  CALCIUM 9.5 8.7*   Liver Function Tests: Recent Labs  Lab 07/19/17 2008  AST 21  ALT 10*  ALKPHOS 105  BILITOT 0.8  PROT 7.4  ALBUMIN 3.5   No results for input(s): LIPASE, AMYLASE in the last 168 hours. No results for input(s): AMMONIA in the last 168 hours. Coagulation Profile: No results for input(s): INR, PROTIME in the last 168 hours. CBC: Recent Labs  Lab 07/19/17 2008 07/21/17 0436  WBC 10.9* 14.4*  NEUTROABS 9.4*  --   HGB 12.0* 9.4*  HCT 38.2* 29.3*  MCV 96.7 95.8  PLT 288 236   Cardiac Enzymes: No results for input(s): CKTOTAL, CKMB, CKMBINDEX, TROPONINI in the last 168 hours. BNP: Invalid input(s): POCBNP CBG: No results for input(s): GLUCAP in the last 168 hours. HbA1C: No results for input(s): HGBA1C in the last 72 hours. Urine analysis:    Component Value Date/Time   COLORURINE YELLOW 07/20/2017 0304   APPEARANCEUR HAZY (A) 07/20/2017 0304   LABSPEC 1.017 07/20/2017 0304   PHURINE 7.0 07/20/2017 0304   GLUCOSEU NEGATIVE 07/20/2017 0304   HGBUR NEGATIVE 07/20/2017 0304   BILIRUBINUR NEGATIVE 07/20/2017 0304   KETONESUR NEGATIVE 07/20/2017 0304   PROTEINUR NEGATIVE 07/20/2017 0304   NITRITE NEGATIVE 07/20/2017 0304   LEUKOCYTESUR NEGATIVE 07/20/2017 0304   Sepsis Labs: @LABRCNTIP (procalcitonin:4,lacticidven:4) ) Recent Results (from the past 240 hour(s))  Respiratory Panel by PCR     Status: Abnormal   Collection Time: 07/20/17  2:48 AM  Result Value Ref Range Status   Adenovirus NOT DETECTED NOT DETECTED Final   Coronavirus 229E NOT DETECTED NOT DETECTED Final   Coronavirus HKU1 NOT DETECTED NOT DETECTED Final   Coronavirus NL63 NOT DETECTED NOT DETECTED Final   Coronavirus OC43 NOT DETECTED NOT DETECTED Final   Metapneumovirus NOT DETECTED NOT DETECTED Final   Rhinovirus / Enterovirus DETECTED (A) NOT DETECTED Final   Influenza A NOT DETECTED NOT DETECTED Final    Influenza B NOT DETECTED NOT DETECTED Final   Parainfluenza Virus 1 NOT DETECTED NOT DETECTED Final   Parainfluenza Virus 2 NOT DETECTED NOT DETECTED Final   Parainfluenza Virus 3 NOT DETECTED NOT DETECTED Final   Parainfluenza Virus 4 NOT DETECTED NOT DETECTED Final   Respiratory Syncytial Virus NOT DETECTED NOT DETECTED Final   Bordetella pertussis NOT DETECTED NOT DETECTED Final   Chlamydophila pneumoniae NOT DETECTED NOT DETECTED Final   Mycoplasma pneumoniae NOT DETECTED NOT DETECTED Final     Scheduled Meds: . carbidopa-levodopa  2 tablet Oral TID  . enoxaparin (LOVENOX) injection  40 mg Subcutaneous Q24H  . feeding supplement (ENSURE ENLIVE)  237 mL Oral BID BM  . glycopyrrolate  1 mg Oral TID  . ipratropium-albuterol  3 mL Nebulization BID  . mouth rinse  15 mL Mouth Rinse BID  . methylPREDNISolone (SOLU-MEDROL) injection  40 mg Intravenous Q8H  . multivitamin with minerals  1 tablet Oral Daily  . vitamin B-12  1,000 mcg Oral Daily   Continuous Infusions: . sodium chloride 75 mL/hr at 07/21/17 0342  .  ampicillin-sulbactam (UNASYN) IV 3 g (07/21/17 0814)    Procedures/Studies: Dg Chest 2 View  Result Date: 07/19/2017 CLINICAL DATA:  Shortness of breath EXAM: CHEST  2 VIEW COMPARISON:  11/20/2016 FINDINGS: No focal pulmonary infiltrate or pleural effusion. Normal cardiomediastinal silhouette with aortic atherosclerosis. No pneumothorax. Mild scarring at the left lung base. Degenerative changes of the spine. IMPRESSION: No active cardiopulmonary disease. Electronically Signed   By: Donavan Foil M.D.   On: 07/19/2017 20:51   Dg Swallowing Func-speech Pathology  Result Date: 07/20/2017 Objective Swallowing Evaluation: Type of Study: MBS-Modified Barium Swallow Study  Patient Details Name: MARVYN TORREZ MRN: [id number] Date of Birth: 1922-06-01 Today's Date: 07/20/2017 Time: SLP Start Time (ACUTE ONLY): 53 -SLP Stop Time (ACUTE ONLY): 1250 SLP Time Calculation (min) (ACUTE  ONLY): 20 min Past Medical History: Past Medical History: Diagnosis Date . Cardiac arrest [facility]) 1970's  "did CPR & revived him"  . Dizzy spells   "frequent" . Hypertension  . Kidney stone  . Lyme disease [phone] . Melanoma ([place]) 2010  "back" . Parkinson's disease ([place])  . Pneumonia   "as a kid" . Syncope and collapse 11/24/2014 Past Surgical History: Past Surgical History: Procedure Laterality Date . BLADDER STONE REMOVAL  2015 . CATARACT EXTRACTION W/ INTRAOCULAR LENS  IMPLANT, BILATERAL Bilateral 1970 . CIRCUMCISION  2015 . CYSTOSCOPY W/ STONE MANIPULATION    "never got the stone though" . MELANOMA EXCISION  2010  "back" . TEMPORAL ARTERY BIOPSY / LIGATION  [phone]'G  "before dx'd w/Lyme's disease" HPI: Patient is a 83 y.o. male with PMH: Parkinson's disease, difficulty swallowing, s/p feeding tube, cardiac arrest, syncope, right bundle blockage, melanoma, who presents to hospital with cough and SOB.  Subjective: pleasant, able to answer questions but with very low vocal intensity Assessment / Plan / Recommendation CHL IP CLINICAL IMPRESSIONS 07/20/2017 Clinical Impression Patient presents with a mod-severe oropharyngeal dysphagia characterized by swallow initiation delays to vallecular sinus for puree, regular and nectar consistencies, and delays to pyriform sinus with thin liquids. Patient with moderate vallecular residuals post initial swallow with puree solids and regular solids, which started to clear (not fully) with cued swallows and use of throat clear and reswallow, as well as chin tuck and reswallow strategies. Patient exhibited penetration and aspiration during and after swallow with thin liquids and did not sense until after penetration or aspiration had already occured. Chin tuck strategy did not help in reducing incidence of penetration or aspiration, but did help with clearance of vallecular sinus and pyriform sinus residuals.  SLP Visit Diagnosis Dysphagia, oropharyngeal phase (R13.12)  Attention and concentration deficit following -- Frontal lobe and executive function deficit following -- Impact on safety and function Moderate aspiration risk   CHL IP TREATMENT RECOMMENDATION 07/20/2017 Treatment Recommendations Therapy as outlined in treatment plan below   Prognosis 07/20/2017 Prognosis for Safe Diet Advancement Fair Barriers to Reach Goals Severity of deficits Barriers/Prognosis Comment -- CHL IP DIET RECOMMENDATION 07/20/2017 SLP Diet Recommendations Dysphagia 2 (Fine chop) solids;Nectar thick liquid Liquid Administration via Cup;No straw Medication Administration Crushed with puree Compensations Minimize environmental distractions;Slow rate;Small sips/bites;Multiple dry swallows after each bite/sip;Clear throat intermittently;Chin tuck Postural Changes Remain semi-upright after after feeds/meals (Comment);Seated upright at 90 degrees   CHL IP OTHER RECOMMENDATIONS 07/20/2017 Recommended Consults -- Oral Care Recommendations Oral care QID Other Recommendations Have oral suction available;Order thickener from pharmacy;Prohibited food (jello, ice cream, thin soups);Remove water pitcher   CHL IP FOLLOW UP RECOMMENDATIONS 07/20/2017 Follow up Recommendations Skilled Nursing facility;Home health SLP;24  hour supervision/assistance   CHL IP FREQUENCY AND DURATION 07/20/2017 Speech Therapy Frequency (ACUTE ONLY) min 2x/week Treatment Duration 1 week      CHL IP ORAL PHASE 07/20/2017 Oral Phase Impaired Oral - Pudding Teaspoon -- Oral - Pudding Cup -- Oral - Honey Teaspoon -- Oral - Honey Cup -- Oral - Nectar Teaspoon -- Oral - Nectar Cup Weak lingual manipulation Oral - Nectar Straw -- Oral - Thin Teaspoon -- Oral - Thin Cup Premature spillage Oral - Thin Straw -- Oral - Puree Weak lingual manipulation Oral - Mech Soft Impaired mastication;Weak lingual manipulation;Piecemeal swallowing;Other (Comment) Oral - Regular -- Oral - Multi-Consistency -- Oral - Pill -- Oral Phase - Comment --  CHL IP  PHARYNGEAL PHASE 07/20/2017 Pharyngeal Phase Impaired Pharyngeal- Pudding Teaspoon -- Pharyngeal -- Pharyngeal- Pudding Cup -- Pharyngeal -- Pharyngeal- Honey Teaspoon -- Pharyngeal -- Pharyngeal- Honey Cup -- Pharyngeal -- Pharyngeal- Nectar Teaspoon -- Pharyngeal -- Pharyngeal- Nectar Cup Delayed swallow initiation-vallecula;Pharyngeal residue - valleculae;Compensatory strategies attempted (with notebox) Pharyngeal -- Pharyngeal- Nectar Straw -- Pharyngeal -- Pharyngeal- Thin Teaspoon Delayed swallow initiation-vallecula;Delayed swallow initiation-pyriform sinuses;Penetration/Aspiration during swallow Pharyngeal Material enters airway, remains ABOVE vocal cords then ejected out Pharyngeal- Thin Cup Delayed swallow initiation-pyriform sinuses;Delayed swallow initiation-vallecula;Penetration/Aspiration during swallow;Reduced airway/laryngeal closure;Compensatory strategies attempted (with notebox) Pharyngeal Material enters airway, passes BELOW cords and not ejected out despite cough attempt by patient;Material enters airway, passes BELOW cords without attempt by patient to eject out (silent aspiration) Pharyngeal- Thin Straw -- Pharyngeal -- Pharyngeal- Puree Pharyngeal residue - valleculae;Compensatory strategies attempted (with notebox) Pharyngeal -- Pharyngeal- Mechanical Soft Delayed swallow initiation-vallecula;Pharyngeal residue - valleculae Pharyngeal -- Pharyngeal- Regular -- Pharyngeal -- Pharyngeal- Multi-consistency -- Pharyngeal -- Pharyngeal- Pill -- Pharyngeal -- Pharyngeal Comment --  No flowsheet data found. CHL IP GO 05/27/2016 Functional Assessment Tool Used clinical judgment Functional Limitations Swallowing Swallow Current Status [phone]) CI Swallow Goal Status (Q4696) CI Swallow Discharge Status (E9528) CI Motor Speech Current Status (U1324) (None) Motor Speech Goal Status (M0102) (None) Motor Speech Goal Status (V2536) (None) Spoken Language Comprehension Current Status (U4403) (None) Spoken  Language Comprehension Goal Status (K7425) (None) Spoken Language Comprehension Discharge Status [phone] (None) Spoken Language Expression Current Status [phone]) (None) Spoken Language Expression Goal Status (P2951) (None) Spoken Language Expression Discharge Status [phone]) (None) Attention Current Status (S0630) (None) Attention Goal Status (Z6010) (None) Attention Discharge Status [phone]) (None) Memory Current Status (T7322) (None) Memory Goal Status (G2542) (None) Memory Discharge Status (H0623) (None) Voice Current Status (J6283) (None) Voice Goal Status (T5176) (None) Voice Discharge Status (H6073) (None) Other Speech-Language Pathology Functional Limitation Current Status (X1062) (None) Other Speech-Language Pathology Functional Limitation Goal Status (I9485) (None) Other Speech-Language Pathology Functional Limitation Discharge Status [phone]) (None) Sonia Baller, MA, CCC-SLP 07/20/17 3:00 PM               Orson Eva, DO  [facility] Pager [phone]  If 7PM-7AM, please contact night-coverage [email or web address] Password [email or web address] 07/21/2017, 11:28 AM   LOS: 1 day

## 2017-07-21 NOTE — Progress Notes (Signed)
   07/21/17 1800  Clinical Encounter Type  Visited With Patient and family together  Visit Type Initial  Referral From Chaplain  Consult/Referral To Chaplain  Spiritual Encounters  Spiritual Needs Prayer  Stress Factors  Patient Stress Factors Health changes  Family Stress Factors Exhausted    Patient was laying in bed watching TV. Family on-site; wife and daughter in-law. Both patient and family were very receptive and appreciative. I provided emotional support, reflective listening and compassionate presence.  Nikhil Osei a Medical sales representative, [employer]

## 2017-07-21 NOTE — Consult Note (Signed)
Consultation Note Date: 07/21/2017   Patient Name: Gabriel Stewart  DOB: [birth year]  MRN: [id number]  Age / Sex: 83 y.o., male  PCP: Leighton Ruff, MD Referring Physician: Orson Eva, MD  Reason for Consultation: Establishing goals of care and Psychosocial/spiritual support  HPI/Patient Profile: 83 y.o. male  admitted on 07/20/2017 with a past medical history significant of Parkinson's disease, difficulty swallowing, s/p of feeding tube, cardiac arrest, syncope, right bundle blockage, melanoma, who presents with cough and shortness of breath.  ED Course: pt was found to have WBC 10.9, electrolytes renal function okay, tachycardia, tachypnea, oxygen saturation 95% on 2 L nasal cannula oxygen, negative chest x-ray. Patient is admitted   Continued physical, functional, and cognitive decline over the past year.  Wife reports it is getting more difficult to care for him at home.  Tells me that patient has had increased  coughing and choking episodes up to multiple times a week  Patient and family face treatemtn option decsions, advanced directive decsions and anticipatory care needs.   Clinical Assessment and Goals of Care:  This NP Wadie Lessen reviewed medical records, received report from team, assessed the patient and then meet at the patient's bedside along with his wife and daughter-in-law to discuss diagnosis, prognosis, GOC, EOL wishes disposition and options.  Detailed natural trajectory of end-stage Parkinson's disease specifically how it relates to swallow and nutrition.  A detailed discussion was had today regarding advanced directives.  Concepts specific to code status, artifical feeding and hydration, continued IV antibiotics and rehospitalization was had.  The difference between a aggressive medical intervention path  and a palliative comfort care path for this patient at this time was had.   Values and goals of care important to patient and family were attempted to be elicited.  MOST form introduced   Concept of Hospice and Palliative Care were discussed  Natural trajectory and expectations at EOL were discussed.  Questions and concerns addressed.   Family encouraged to call with questions or concerns.  PMT will continue to support holistically.   HCPOA/ wife --  made a copy of advanced directive and placed it in hard chart    SUMMARY OF RECOMMENDATIONS   - DNR/DNI - SNF for short term rehab - evaluate PEG for usage   Code Status/Advance Care Planning:  DNR- documented today   Symptom Management:   Weakness: Physical therapy and occupational evaluation and treatment.  Family is open to skilled nursing facility for short-term rehab if eligible.  Dyphasia: Assess current PEG tube for utilization, may need dietary evaluation for evaluation and initiation of feeds             Family request full utilization of PEG tube for nutrition and hydration.                      They are agreeable to n.p.o. status if recommended.  Palliative Prophylaxis:   Aspiration, Bowel Regimen, Delirium Protocol, Frequent Pain Assessment and Oral Care  Additional Recommendations (Limitations, Scope, Preferences):  Full Scope Treatment  Psycho-social/Spiritual:   Desire for further Chaplaincy support:yes  Additional Recommendations: Education on Hospice  Prognosis:   < 12 months  Discharge Planning: To Be Determined      Primary Diagnoses: Present on Admission: . Cough . Protein-calorie malnutrition, severe ([place]) . Parkinson's disease ([place]) . Hypertension . Aspiration into airway   I have reviewed the medical record, interviewed the patient and family, and examined the patient. The following aspects are pertinent.  Past Medical History:  Diagnosis Date  . Cardiac arrest [facility]) 1970's   "did CPR & revived him"   . Dizzy spells    "frequent"  . Hypertension   .  Kidney stone   . Lyme disease [phone]  . Melanoma ([place]) 2010   "back"  . Parkinson's disease ([place])   . Pneumonia    "as a kid"  . Syncope and collapse 11/24/2014   Social History   Socioeconomic History  . Marital status: Married    Spouse name: [name]  . Number of children: 3  . Years of education: 55  . Highest education level: None  Social Needs  . Financial resource strain: None  . Food insecurity - worry: None  . Food insecurity - inability: None  . Transportation needs - medical: None  . Transportation needs - non-medical: None  Occupational History    Comment: retired  Tobacco Use  . Smoking status: Former Smoker    Packs/day: 1.00    Years: 5.00    Pack years: 5.00    Types: Cigarettes  . Smokeless tobacco: Never Used  . Tobacco comment: quit in 1960  Substance and Sexual Activity  . Alcohol use: Yes    Alcohol/week: 4.2 oz    Types: 7 Shots of liquor per week    Comment: 1 shots bourbon daily  . Drug use: No  . Sexual activity: Not Currently  Other Topics Concern  . None  Social History Narrative   Patient resides with wife, can write with hands   Family History  Problem Relation Age of Onset  . Appendicitis Brother   . Pneumonia Brother    Scheduled Meds: . carbidopa-levodopa  2 tablet Oral TID  . enoxaparin (LOVENOX) injection  40 mg Subcutaneous Q24H  . feeding supplement (ENSURE ENLIVE)  237 mL Oral BID BM  . glycopyrrolate  1 mg Oral TID  . ipratropium-albuterol  3 mL Nebulization BID  . mouth rinse  15 mL Mouth Rinse BID  . methylPREDNISolone (SOLU-MEDROL) injection  40 mg Intravenous Q8H  . multivitamin with minerals  1 tablet Oral Daily  . vitamin B-12  1,000 mcg Oral Daily   Continuous Infusions: . sodium chloride 75 mL/hr at 07/21/17 0342  . ampicillin-sulbactam (UNASYN) IV 3 g (07/21/17 0814)   PRN Meds:.acetaminophen, albuterol, guaiFENesin, hydrALAZINE, ondansetron (ZOFRAN) IV, RESOURCE THICKENUP CLEAR, zolpidem Medications  Prior to Admission:  Prior to Admission medications   Medication Sig Start Date End Date Taking? Authorizing Provider  B Complex-C (SUPER B COMPLEX PO) Take 1 tablet by mouth daily.   Yes [provider]  carbidopa-levodopa (SINEMET IR) 25-100 MG tablet Take 2 tablets 3 (three) times daily by mouth. 06/05/17  Yes Penumalli, Earlean Polka, MD  Cholecalciferol (VITAMIN D PO) Take 1 capsule by mouth daily.   Yes [provider]  ENSURE (ENSURE) Take 237 mLs by mouth 5 (five) times daily. Or Equate, 350 calories each, 5 cans daily    Yes [provider]  glycopyrrolate (ROBINUL) 1 MG tablet  Take 1 tablet (1 mg total) 3 (three) times daily by mouth. 06/05/17  Yes Penumalli, Vikram R, MD  rasagiline (AZILECT) 0.5 MG TABS tablet Take 0.5 mg by mouth daily.   Yes [provider]  vitamin B-12 (CYANOCOBALAMIN) 1000 MCG tablet Take 1,000 mcg by mouth daily.    Yes [provider]   Allergies  Allergen Reactions  . Novocain [Procaine]     Syncope, "passes out"   Review of Systems  Constitutional: Positive for unexpected weight change.  HENT: Positive for trouble swallowing.     Physical Exam  Constitutional: He appears cachectic. He appears ill.  HENT:  -audible Throat secretions  Cardiovascular: Normal rate, regular rhythm and normal heart sounds.  Pulmonary/Chest: He has decreased breath sounds.  Musculoskeletal:  Generalized weakness with muscle atrophy  Skin: Skin is warm and dry.  Psychiatric: Cognition and memory are impaired.    Vital Signs: BP (!) 117/47 (BP Location: Right Arm)   Pulse 73   Temp 98.5 F (36.9 C) (Oral)   Resp 18   Ht 6' (1.829 m)   Wt 74.9 kg (165 lb 1.6 oz)   SpO2 95%   BMI 22.39 kg/m  Pain Assessment: 0-10   Pain Score: 0-No pain   SpO2: SpO2: 95 % O2 Device:SpO2: 95 % O2 Flow Rate: .   IO: Intake/output summary:   Intake/Output Summary (Last 24 hours) at 07/21/2017 1026 Last data filed at 07/21/2017  0315 Gross per 24 hour  Intake 1973.75 ml  Output 550 ml  Net 1423.75 ml    LBM: Last BM Date: 07/19/17 Baseline Weight: Weight: 75.8 kg (167 lb 1.7 oz) Most recent weight: Weight: 74.9 kg (165 lb 1.6 oz)     Palliative Assessment/Data: 30% at best   Discussed with Dr Tat  Time In: 0930 Time Out: 1045 Time Total: 75 min Greater than 50%  of this time was spent counseling and coordinating care related to the above assessment and plan.  Signed by: Wadie Lessen, NP   Please contact Palliative Medicine Team phone at [phone] for questions and concerns.  For individual provider: See Shea Evans

## 2017-07-21 NOTE — Clinical Social Work Note (Signed)
CSW acknowledges SNF consult. Please enter PT/OT orders for evaluations in order to determine appropriateness.  Gabriel Stewart, [place]

## 2017-07-21 NOTE — Progress Notes (Signed)
Case discussed with Wadie Lessen, NP with palliative after family meeting.  1.  Changed to DNR 2.  gastrograffin G-tube study to confirm placement and patency of Gtube 3. If gastrograffin study unremarkable-->use Gtube exclusively-->wife wants pt to be completely NPO; wants everything to go through tube 4. Plan d/c to SNF for rehab with palliative care services for now with ultimate plan of residential hospice in near future.  DTat

## 2017-07-22 DIAGNOSIS — J208 Acute bronchitis due to other specified organisms: Secondary | ICD-10-CM

## 2017-07-22 DIAGNOSIS — Z515 Encounter for palliative care: Secondary | ICD-10-CM

## 2017-07-22 DIAGNOSIS — R1310 Dysphagia, unspecified: Secondary | ICD-10-CM

## 2017-07-22 DIAGNOSIS — R531 Weakness: Secondary | ICD-10-CM

## 2017-07-22 DIAGNOSIS — Z66 Do not resuscitate: Secondary | ICD-10-CM

## 2017-07-22 LAB — CBC
HEMATOCRIT: 34.1 % — AB (ref 39.0–52.0)
Hemoglobin: 10.8 g/dL — ABNORMAL LOW (ref 13.0–17.0)
MCH: 30.5 pg (ref 26.0–34.0)
MCHC: 31.7 g/dL (ref 30.0–36.0)
MCV: 96.3 fL (ref 78.0–100.0)
Platelets: 248 10*3/uL (ref 150–400)
RBC: 3.54 MIL/uL — ABNORMAL LOW (ref 4.22–5.81)
RDW: 14.3 % (ref 11.5–15.5)
WBC: 13.4 10*3/uL — ABNORMAL HIGH (ref 4.0–10.5)

## 2017-07-22 LAB — GLUCOSE, CAPILLARY: GLUCOSE-CAPILLARY: 146 mg/dL — AB (ref 65–99)

## 2017-07-22 MED ORDER — ENSURE ENLIVE PO LIQD: 237.0000 mL | Freq: Two times a day (BID) | ORAL | Status: DC

## 2017-07-22 MED ORDER — CARBIDOPA-LEVODOPA 25-100 MG PO TABS
2.0000 | ORAL_TABLET | Freq: Three times a day (TID) | ORAL | Status: DC
  Administered 2017-07-22 – 2017-07-25 (×9): 2
  Filled 2017-07-22 (×9): qty 2

## 2017-07-22 MED ORDER — JEVITY 1.2 CAL PO LIQD
474.0000 mL | Freq: Three times a day (TID) | ORAL | Status: DC
  Administered 2017-07-22 – 2017-07-25 (×8): 474 mL
  Filled 2017-07-22 (×15): qty 474

## 2017-07-22 MED ORDER — PREDNISONE 20 MG PO TABS
30.0000 mg | ORAL_TABLET | Freq: Every day | ORAL | Status: DC
  Administered 2017-07-23 – 2017-07-24 (×2): 30 mg
  Filled 2017-07-22 (×2): qty 1

## 2017-07-22 MED ORDER — FREE WATER
300.0000 mL | Freq: Three times a day (TID) | Status: DC
  Administered 2017-07-22 – 2017-07-25 (×8): 300 mL

## 2017-07-22 MED ORDER — ADULT MULTIVITAMIN LIQUID CH
Freq: Every day | ORAL | Status: DC
  Administered 2017-07-23 – 2017-07-24 (×2)
  Administered 2017-07-25: 15 mL
  Filled 2017-07-22 (×3): qty 15

## 2017-07-22 MED ORDER — VITAMIN B-12 1000 MCG PO TABS
1000.0000 ug | ORAL_TABLET | Freq: Every day | ORAL | Status: DC
  Administered 2017-07-23 – 2017-07-25 (×3): 1000 ug
  Filled 2017-07-22 (×3): qty 1

## 2017-07-22 MED ORDER — PRO-STAT SUGAR FREE PO LIQD
30.0000 mL | Freq: Every day | ORAL | Status: DC
  Administered 2017-07-22 – 2017-07-25 (×4): 30 mL via ORAL
  Filled 2017-07-22 (×4): qty 30

## 2017-07-22 MED ORDER — GLYCOPYRROLATE 1 MG PO TABS
1.0000 mg | ORAL_TABLET | Freq: Three times a day (TID) | ORAL | Status: DC
  Administered 2017-07-22 – 2017-07-25 (×9): 1 mg
  Filled 2017-07-22 (×10): qty 1

## 2017-07-22 MED ORDER — ACETAMINOPHEN 160 MG/5ML PO SOLN
650.0000 mg | Freq: Four times a day (QID) | ORAL | Status: DC | PRN
  Filled 2017-07-22: qty 20.3

## 2017-07-22 NOTE — Evaluation (Signed)
Occupational Therapy Evaluation Patient Details Name: Gabriel Stewart MRN: [id number] DOB: [birth year] Today's Date: 07/22/2017    History of Present Illness Pt adm with aspiration pneumonitis. PMH - Parkinsons, syncope, melanoma, cardiac arrest   Clinical Impression   Pt reports performing ADL without assist, walking with a rollator and relying on his wife for IADL. Pt presents with baseline impairment in cognition and impaired standing balance. He requires min assist for ADL. Will follow acutely.    Follow Up Recommendations  SNF    Equipment Recommendations       Recommendations for Other Services       Precautions / Restrictions Precautions Precautions: Fall Restrictions Weight Bearing Restrictions: No      Mobility Bed Mobility Overal bed mobility: Needs Assistance Bed Mobility: Supine to Sit     Supine to sit: Supervision     General bed mobility comments: no physical assist, used rail  Transfers Overall transfer level: Needs assistance Equipment used: Rolling walker (2 wheeled) Transfers: Sit to/from [facility] Sit to Stand: Min guard Stand pivot transfers: Min guard       General transfer comment: good technique    Balance Overall balance assessment: Needs assistance   Sitting balance-Leahy Scale: Good       Standing balance-Leahy Scale: Poor                             ADL either performed or assessed with clinical judgement   ADL Overall ADL's : Needs assistance/impaired Eating/Feeding: Set up;Sitting   Grooming: Wash/dry hands;Wash/dry face;Sitting;Set up   Upper Body Bathing: Minimal assistance;Sitting   Lower Body Bathing: Minimal assistance;Sit to/from stand   Upper Body Dressing : Set up;Sitting   Lower Body Dressing: Minimal assistance;Sit to/from stand   Toilet Transfer: Min Insurance claims handler Details (indicate cue type and reason): simulated to chair Toileting- Clothing  Manipulation and Hygiene: Minimal assistance;Sit to/from stand               Vision Baseline Vision/History: Wears glasses Wears Glasses: Reading only Patient Visual Report: No change from baseline       Perception     Praxis      Pertinent Vitals/Pain Pain Assessment: No/denies pain     Hand Dominance Right   Extremity/Trunk Assessment Upper Extremity Assessment Upper Extremity Assessment: Overall WFL for tasks assessed   Lower Extremity Assessment Lower Extremity Assessment: Defer to PT evaluation       Communication Communication Communication: Expressive difficulties(low volume)   Cognition Arousal/Alertness: Awake/alert Behavior During Therapy: WFL for tasks assessed/performed Overall Cognitive Status: History of cognitive impairments - at baseline                                 General Comments: not oriented to place, time, situation   General Comments       Exercises     Shoulder Instructions      Home Living Family/patient expects to be discharged to:: Skilled nursing facility Living Arrangements: Spouse/significant other Available Help at Discharge: Family;Available 24 hours/day Type of Home: House Home Access: Level entry     Home Layout: One level     Bathroom Shower/Tub: Occupational psychologist: Standard     Home Equipment: Environmental consultant - 4 wheels;Shower seat;Hand held shower head          Prior Functioning/Environment Level of Independence: Needs assistance  Gait / Transfers Assistance Needed: walks with rollator ADL's / Homemaking Assistance Needed: performs showering, dressing, grooming and feeding modified independently, relies on wife for IADL            OT Problem List: Impaired balance (sitting and/or standing);Decreased cognition      OT Treatment/Interventions: Self-care/ADL training;DME and/or AE instruction;Patient/family education;Cognitive remediation/compensation;Balance training    OT  Goals(Current goals can be found in the care plan section) Acute Rehab OT Goals Patient Stated Goal: to go home today OT Goal Formulation: With patient Time For Goal Achievement: 08/05/17 Potential to Achieve Goals: Good ADL Goals Pt Will Perform Grooming: with supervision;standing Pt Will Perform Lower Body Bathing: with supervision;sit to/from stand Pt Will Perform Lower Body Dressing: with supervision;sit to/from stand Pt Will Transfer to Toilet: with supervision;ambulating;regular height toilet Pt Will Perform Toileting - Clothing Manipulation and hygiene: with supervision;sit to/from stand  OT Frequency: Min 2X/week   Barriers to D/C:            Co-evaluation              AM-PAC PT "6 Clicks" Daily Activity     Outcome Measure Help from another person eating meals?: A Little Help from another person taking care of personal grooming?: A Little Help from another person toileting, which includes using toliet, bedpan, or urinal?: A Little Help from another person bathing (including washing, rinsing, drying)?: A Little Help from another person to put on and taking off regular upper body clothing?: None Help from another person to put on and taking off regular lower body clothing?: A Little 6 Click Score: 19   End of Session Equipment Utilized During Treatment: Gait belt;Rolling walker  Activity Tolerance: Patient tolerated treatment well Patient left: in chair;with call bell/phone within reach;with chair alarm set;with nursing/sitter in room  OT Visit Diagnosis: Unsteadiness on feet (R26.81)                Time: 5631-4970 OT Time Calculation (min): 22 min Charges:  OT General Charges $OT Visit: 1 Visit OT Evaluation $OT Eval Moderate Complexity: 1 Mod G-Codes:     30-Jul-2017 Nestor Lewandowsky, OTR/L Pager: [place], [place] 07-30-17, 9:41 AM

## 2017-07-22 NOTE — Progress Notes (Signed)
Patient ID: Gabriel Stewart, male   DOB: 14-Feb-1922, 83 y.o.   MRN: [id number]  This NP visited patient at the bedside as a follow up to  yesterday's [place].  Patient is out of bed to the chair, he denies pain or discomfort.  Seems to be a little more alert today.  Returned a call from his daughter Kimber Relic  # [phone] lives in [place],  continued conversation from yesterday regarding diagnosis, prognosis, goals of care, treatment option decisions and anticipatory care needs.  Detailed natural trajectory and expectations of end-stage Parkinson's disease.  As discussed yesterday with patient's wife plan is to:  -initiate tube feeds via PEG, continue n.p.o. status -discharge to skilled nursing facility for rehab if eligible -family planning for  anticipated home care needs once patient returns home from SNF -Educated family on hospice benefit  Discussed with family  the importance of continued conversation with family and their  medical providers regarding overall plan of care and treatment options,  ensuring decisions are within the context of the patients values and GOCs.  Questions and concerns addressed  Time in  1045         Time out 1120    Total time spent on the unit was 35 minutes  Greater than 50% of the time was spent in counseling and coordination of care  Wadie Lessen NP  Palliative Medicine Team Team Phone # [phone] Pager [phone]

## 2017-07-22 NOTE — Progress Notes (Signed)
Pt is up with a congested cough. Iv fluids continued

## 2017-07-22 NOTE — Progress Notes (Addendum)
PROGRESS NOTE  Gabriel Stewart EZM:[id number] DOB: [birth year] DOA: 07/20/2017 PCP: Leighton Ruff, MD  Brief History:  83 year old married male with PMH of Parkinson's disease, dysphagia with a PEG tube that has not been used in 2 years, on oral diet but not using thickener consistently, HTN, presented with one-week history of cough and dyspnea. He was admitted for aspiration pneumonitis and respiratory distress. Palliative care consulted, transitioned to DO NOT RESUSCITATE, spouse wishes to have tube feeds only and NPO given aspiration risks.  Assessment/Plan: Aspiration pneumonitis - Dr. Lucky Rathke had long discussion with the patient's wife--does not appear ready for comfort feedings/comfort care -Appreciate speech therapy evaluation--> dysphagia 2 diet with nectar thickened liquids >now discontinued based on palliative care input. -The patient will be a high risk for recurrent aspirations and repeat hospitalizations -Continue Unasyn. Discontinued IV fluids. -Influenza PCR negative -Respiratory viral panel-positive for rhinovirus-likely compounding the patient's current respiratory status - continue robinul - Discussed with Dr. Carles Collet and his note from 12/30 appreciated. Since G-tube is appropriately placed, spouse wants patient to be completely NPO and wants everything to go through the tube. - Start tube feeding per dietitian recommendations. - Transition to tube Augmentin in a day or 2. - Follow-up chest x-ray in am. Blood cultures 2: Negative to date.  Acute viral bronchitis with Bronchospasm - Chest x-ray on admission without pneumonia. - All his chest findings may be related to acute viral bronchitis/RSV panel positive for rhinovirus. - Continue duo nebs -de-escalated IV Solu-Medrol>>>po prednisone once daily and wean - Improving.  Parkinson's disease -Continue Sinemet -Patient follows Dr. Leta Baptist  Moderate protein calorie malnutrition -Continue Ensure -consult  nutrition -still has poor po intake, with concerns regarding patient's ability to sustain adequate po intake in the setting of aspiration - No plans are to start tube feeding, possibly 12/31.  Essential hypertension  -wife states that patient no longer takes lisinopril -BP remains stable off lisinopril  Hyperglycemia -due to steroids. Monitor CBG's    Disposition Plan:   SNF when patient tolerating tube feeds. Palliative care follow-up at SNF. Family Communication:  No family present   Consultants:  Palliative medicine  Code Status:   DO NOT RESUSCITATE  DVT Prophylaxis:  Celebration Lovenox   Procedures: As Listed in Progress Note Above  Antibiotics: Unasyn 12/29>>       Subjective: Patient denies cough or dyspnea but noted to have a wet sounding cough.  Objective: Vitals:   07/22/17 0539 07/22/17 0547 07/22/17 0803 07/22/17 1231  BP: 139/64   138/62  Pulse: 68   68  Resp: 20   18  Temp: 97.6 F (36.4 C)   98 F (36.7 C)  TempSrc: Oral   Oral  SpO2: 95%  95% 95%  Weight:  75.8 kg (167 lb 3.2 oz)    Height:        Intake/Output Summary (Last 24 hours) at 07/22/2017 1418 Last data filed at 07/22/2017 1231 Gross per 24 hour  Intake 800 ml  Output 800 ml  Net 0 ml   Weight change:  Exam:   General:  Elderly male, moderately built and frail, chronically ill looking, lying propped up in bed without distress.  Cardiovascular: S1 and S2 heard, RRR. No JVD, murmurs or pedal edema. Telemetry: Sinus rhythm.  Respiratory:  Harsh breath sounds bilaterally with scattered occasional bilateral rhonchi and occasional basal crackles. No increased work of breathing.  Abdomen: Soft/+BS, non tender, non distended, no guarding  CNS: Alert and oriented 2. No focal neurological deficits.  Extremities: No edema, No lymphangitis, No petechiae, No rashes, no synovitis   Data Reviewed: I have personally reviewed following labs and imaging studies Basic Metabolic  Panel: Recent Labs  Lab 07/19/17 2008 07/21/17 0436  NA 139 139  K 4.1 4.3  CL 101 104  CO2 30 27  GLUCOSE 160* 152*  BUN 25* 39*  CREATININE 0.87 0.89  CALCIUM 9.5 8.7*   Liver Function Tests: Recent Labs  Lab 07/19/17 2008  AST 21  ALT 10*  ALKPHOS 105  BILITOT 0.8  PROT 7.4  ALBUMIN 3.5   CBC: Recent Labs  Lab 07/19/17 2008 07/21/17 0436 07/22/17 0730  WBC 10.9* 14.4* 13.4*  NEUTROABS 9.4*  --   --   HGB 12.0* 9.4* 10.8*  HCT 38.2* 29.3* 34.1*  MCV 96.7 95.8 96.3  PLT 288 236 248    Urine analysis:    Component Value Date/Time   COLORURINE YELLOW 07/20/2017 0304   APPEARANCEUR HAZY (A) 07/20/2017 0304   LABSPEC 1.017 07/20/2017 0304   PHURINE 7.0 07/20/2017 0304   GLUCOSEU NEGATIVE 07/20/2017 0304   HGBUR NEGATIVE 07/20/2017 0304   BILIRUBINUR NEGATIVE 07/20/2017 0304   KETONESUR NEGATIVE 07/20/2017 0304   PROTEINUR NEGATIVE 07/20/2017 0304   NITRITE NEGATIVE 07/20/2017 0304   LEUKOCYTESUR NEGATIVE 07/20/2017 0304    Recent Results (from the past 240 hour(s))  Culture, blood (routine x 2) Call MD if unable to obtain prior to antibiotics being given     Status: None (Preliminary result)   Collection Time: 07/20/17  2:30 AM  Result Value Ref Range Status   Specimen Description BLOOD RIGHT HAND  Final   Special Requests IN PEDIATRIC BOTTLE Blood Culture adequate volume  Final   Culture NO GROWTH 2 DAYS  Final   Report Status PENDING  Incomplete  Culture, blood (routine x 2) Call MD if unable to obtain prior to antibiotics being given     Status: None (Preliminary result)   Collection Time: 07/20/17  2:40 AM  Result Value Ref Range Status   Specimen Description BLOOD LEFT ANTECUBITAL  Final   Special Requests   Final    BOTTLES DRAWN AEROBIC AND ANAEROBIC Blood Culture adequate volume   Culture NO GROWTH 2 DAYS  Final   Report Status PENDING  Incomplete  Respiratory Panel by PCR     Status: Abnormal   Collection Time: 07/20/17  2:48 AM  Result  Value Ref Range Status   Adenovirus NOT DETECTED NOT DETECTED Final   Coronavirus 229E NOT DETECTED NOT DETECTED Final   Coronavirus HKU1 NOT DETECTED NOT DETECTED Final   Coronavirus NL63 NOT DETECTED NOT DETECTED Final   Coronavirus OC43 NOT DETECTED NOT DETECTED Final   Metapneumovirus NOT DETECTED NOT DETECTED Final   Rhinovirus / Enterovirus DETECTED (A) NOT DETECTED Final   Influenza A NOT DETECTED NOT DETECTED Final   Influenza B NOT DETECTED NOT DETECTED Final   Parainfluenza Virus 1 NOT DETECTED NOT DETECTED Final   Parainfluenza Virus 2 NOT DETECTED NOT DETECTED Final   Parainfluenza Virus 3 NOT DETECTED NOT DETECTED Final   Parainfluenza Virus 4 NOT DETECTED NOT DETECTED Final   Respiratory Syncytial Virus NOT DETECTED NOT DETECTED Final   Bordetella pertussis NOT DETECTED NOT DETECTED Final   Chlamydophila pneumoniae NOT DETECTED NOT DETECTED Final   Mycoplasma pneumoniae NOT DETECTED NOT DETECTED Final     Scheduled Meds: . carbidopa-levodopa  2 tablet Oral TID  .  enoxaparin (LOVENOX) injection  40 mg Subcutaneous Q24H  . feeding supplement (ENSURE ENLIVE)  237 mL Oral BID BM  . glycopyrrolate  1 mg Oral TID  . ipratropium-albuterol  3 mL Nebulization BID  . mouth rinse  15 mL Mouth Rinse BID  . multivitamin with minerals  1 tablet Oral Daily  . predniSONE  40 mg Oral Q breakfast  . vitamin B-12  1,000 mcg Oral Daily   Continuous Infusions: . sodium chloride 75 mL/hr at 07/22/17 0609  . ampicillin-sulbactam (UNASYN) IV 3 g (07/22/17 1405)    Procedures/Studies: Dg Chest 2 View  Result Date: 07/19/2017 CLINICAL DATA:  Shortness of breath EXAM: CHEST  2 VIEW COMPARISON:  11/20/2016 FINDINGS: No focal pulmonary infiltrate or pleural effusion. Normal cardiomediastinal silhouette with aortic atherosclerosis. No pneumothorax. Mild scarring at the left lung base. Degenerative changes of the spine. IMPRESSION: No active cardiopulmonary disease. Electronically Signed   By:  Donavan Foil M.D.   On: 07/19/2017 20:51   Dg Abdomen Peg Tube Location  Result Date: 07/21/2017 CLINICAL DATA:  Percutaneous gastrostomy tube placement. EXAM: ABDOMEN - 1 VIEW COMPARISON:  None. FINDINGS: A small amount of contrast injected through the gastrostomy tube is seen within the stomach and proximal duodenum. Oral contrast material also seen within the colon, likely from recent modified barium swallow. No evidence of dilated bowel loops. IMPRESSION: Percutaneous gastrostomy tube within stomach. Unremarkable bowel gas pattern. Electronically Signed   By: Earle Gell M.D.   On: 07/21/2017 20:06   Dg Swallowing Func-speech Pathology  Result Date: 07/20/2017 Objective Swallowing Evaluation: Type of Study: MBS-Modified Barium Swallow Study  Patient Details Name: JAQUARIOUS GREY MRN: [id number] Date of Birth: 1921/11/12 Today's Date: 07/20/2017 Time: SLP Start Time (ACUTE ONLY): 33 -SLP Stop Time (ACUTE ONLY): 1250 SLP Time Calculation (min) (ACUTE ONLY): 20 min Past Medical History: Past Medical History: Diagnosis Date . Cardiac arrest [facility]) 1970's  "did CPR & revived him"  . Dizzy spells   "frequent" . Hypertension  . Kidney stone  . Lyme disease [id number] . Melanoma ([place]) 2010  "back" . Parkinson's disease ([place])  . Pneumonia   "as a kid" . Syncope and collapse 11/24/2014 Past Surgical History: Past Surgical History: Procedure Laterality Date . BLADDER STONE REMOVAL  2015 . CATARACT EXTRACTION W/ INTRAOCULAR LENS  IMPLANT, BILATERAL Bilateral 1970 . CIRCUMCISION  2015 . CYSTOSCOPY W/ STONE MANIPULATION    "never got the stone though" . MELANOMA EXCISION  2010  "back" . TEMPORAL ARTERY BIOPSY / LIGATION  2993-7169'C  "before dx'd w/Lyme's disease" HPI: Patient is a 83 y.o. male with PMH: Parkinson's disease, difficulty swallowing, s/p feeding tube, cardiac arrest, syncope, right bundle blockage, melanoma, who presents to hospital with cough and SOB.  Subjective: pleasant, able to answer questions  but with very low vocal intensity Assessment / Plan / Recommendation CHL IP CLINICAL IMPRESSIONS 07/20/2017 Clinical Impression Patient presents with a mod-severe oropharyngeal dysphagia characterized by swallow initiation delays to vallecular sinus for puree, regular and nectar consistencies, and delays to pyriform sinus with thin liquids. Patient with moderate vallecular residuals post initial swallow with puree solids and regular solids, which started to clear (not fully) with cued swallows and use of throat clear and reswallow, as well as chin tuck and reswallow strategies. Patient exhibited penetration and aspiration during and after swallow with thin liquids and did not sense until after penetration or aspiration had already occured. Chin tuck strategy did not help in reducing incidence of penetration or aspiration, but  did help with clearance of vallecular sinus and pyriform sinus residuals.  SLP Visit Diagnosis Dysphagia, oropharyngeal phase (R13.12) Attention and concentration deficit following -- Frontal lobe and executive function deficit following -- Impact on safety and function Moderate aspiration risk   CHL IP TREATMENT RECOMMENDATION 07/20/2017 Treatment Recommendations Therapy as outlined in treatment plan below   Prognosis 07/20/2017 Prognosis for Safe Diet Advancement Fair Barriers to Reach Goals Severity of deficits Barriers/Prognosis Comment -- CHL IP DIET RECOMMENDATION 07/20/2017 SLP Diet Recommendations Dysphagia 2 (Fine chop) solids;Nectar thick liquid Liquid Administration via Cup;No straw Medication Administration Crushed with puree Compensations Minimize environmental distractions;Slow rate;Small sips/bites;Multiple dry swallows after each bite/sip;Clear throat intermittently;Chin tuck Postural Changes Remain semi-upright after after feeds/meals (Comment);Seated upright at 90 degrees   CHL IP OTHER RECOMMENDATIONS 07/20/2017 Recommended Consults -- Oral Care Recommendations Oral care QID  Other Recommendations Have oral suction available;Order thickener from pharmacy;Prohibited food (jello, ice cream, thin soups);Remove water pitcher   CHL IP FOLLOW UP RECOMMENDATIONS 07/20/2017 Follow up Recommendations Skilled Nursing facility;Home health SLP;24 hour supervision/assistance   CHL IP FREQUENCY AND DURATION 07/20/2017 Speech Therapy Frequency (ACUTE ONLY) min 2x/week Treatment Duration 1 week      CHL IP ORAL PHASE 07/20/2017 Oral Phase Impaired Oral - Pudding Teaspoon -- Oral - Pudding Cup -- Oral - Honey Teaspoon -- Oral - Honey Cup -- Oral - Nectar Teaspoon -- Oral - Nectar Cup Weak lingual manipulation Oral - Nectar Straw -- Oral - Thin Teaspoon -- Oral - Thin Cup Premature spillage Oral - Thin Straw -- Oral - Puree Weak lingual manipulation Oral - Mech Soft Impaired mastication;Weak lingual manipulation;Piecemeal swallowing;Other (Comment) Oral - Regular -- Oral - Multi-Consistency -- Oral - Pill -- Oral Phase - Comment --  CHL IP PHARYNGEAL PHASE 07/20/2017 Pharyngeal Phase Impaired Pharyngeal- Pudding Teaspoon -- Pharyngeal -- Pharyngeal- Pudding Cup -- Pharyngeal -- Pharyngeal- Honey Teaspoon -- Pharyngeal -- Pharyngeal- Honey Cup -- Pharyngeal -- Pharyngeal- Nectar Teaspoon -- Pharyngeal -- Pharyngeal- Nectar Cup Delayed swallow initiation-vallecula;Pharyngeal residue - valleculae;Compensatory strategies attempted (with notebox) Pharyngeal -- Pharyngeal- Nectar Straw -- Pharyngeal -- Pharyngeal- Thin Teaspoon Delayed swallow initiation-vallecula;Delayed swallow initiation-pyriform sinuses;Penetration/Aspiration during swallow Pharyngeal Material enters airway, remains ABOVE vocal cords then ejected out Pharyngeal- Thin Cup Delayed swallow initiation-pyriform sinuses;Delayed swallow initiation-vallecula;Penetration/Aspiration during swallow;Reduced airway/laryngeal closure;Compensatory strategies attempted (with notebox) Pharyngeal Material enters airway, passes BELOW cords and not ejected out  despite cough attempt by patient;Material enters airway, passes BELOW cords without attempt by patient to eject out (silent aspiration) Pharyngeal- Thin Straw -- Pharyngeal -- Pharyngeal- Puree Pharyngeal residue - valleculae;Compensatory strategies attempted (with notebox) Pharyngeal -- Pharyngeal- Mechanical Soft Delayed swallow initiation-vallecula;Pharyngeal residue - valleculae Pharyngeal -- Pharyngeal- Regular -- Pharyngeal -- Pharyngeal- Multi-consistency -- Pharyngeal -- Pharyngeal- Pill -- Pharyngeal -- Pharyngeal Comment --  No flowsheet data found. CHL IP GO 05/27/2016 Functional Assessment Tool Used clinical judgment Functional Limitations Swallowing Swallow Current Status [phone]) CI Swallow Goal Status (X3818) CI Swallow Discharge Status (E9937) CI Motor Speech Current Status (J6967) (None) Motor Speech Goal Status (E9381) (None) Motor Speech Goal Status (O1751) (None) Spoken Language Comprehension Current Status (W2585) (None) Spoken Language Comprehension Goal Status (I7782) (None) Spoken Language Comprehension Discharge Status [phone]) (None) Spoken Language Expression Current Status [phone]) (None) Spoken Language Expression Goal Status (X5400) (None) Spoken Language Expression Discharge Status [phone]) (None) Attention Current Status (P5093) (None) Attention Goal Status (O6712) (None) Attention Discharge Status (W5809) (None) Memory Current Status (X8338) (None) Memory Goal Status (S5053) (None) Memory Discharge Status (Z7673) (None) Voice Current  Status [phone]) (None) Voice Goal Status [phone]) (None) Voice Discharge Status [phone]) (None) Other Speech-Language Pathology Functional Limitation Current Status [phone]) (None) Other Speech-Language Pathology Functional Limitation Goal Status (L2174) (None) Other Speech-Language Pathology Functional Limitation Discharge Status [phone]) (None) Sonia Baller, MA, CCC-SLP 07/20/17 3:00 PM               Vernell Leep, MD, FACP, [facility] Pager [phone]  If 7PM-7AM, please contact night-coverage [email or web address] Password [email or web address] 07/22/2017, 2:26 PM   LOS: 2 days

## 2017-07-22 NOTE — Progress Notes (Signed)
Follow-up Nutrition Assessement  DOCUMENTATION CODES:   Non-severe (moderate) malnutrition in context of chronic illness  INTERVENTION:   Initiate bolus tube feeding of 2 cans Jevity 1.2 TID via G-tube   Add 30 mL Pro-Stat daily  Tube feeding regimen provides 2230 kcal, 106 grams of protein, and 1080 ml of H2O.   Provide 300 mL water flush q 8 hours Flush tube 30 mL before and after each feeding Total water provided: 2160 mL/d  NUTRITION DIAGNOSIS:   Moderate Malnutrition related to dysphagia, other (see comment)(advanced age) as evidenced by moderate fat depletion, moderate muscle depletion.  Ongoing   GOAL:   Patient will meet greater than or equal to 90% of their needs  Progressing   MONITOR:   PO intake, Supplement acceptance, Labs, Weight trends, I & O's, Skin  REASON FOR ASSESSMENT:   Consult Assessment of nutrition requirement/status  ASSESSMENT:   83 y.o. male with medical history significant of Parkinson's disease, difficulty swallowing, s/p of feeding tube, cardiac arrest, syncope, right bundle blockage, melanoma, who presents with cough and shortness of breath. Pt found to have aspiration PNA  RD consulted for TF intiation and management. Discussed pt with RN.  Pt seen by SLP, diet advanced to Dysphagia 2 with nectar thick liquids, pt consumed only bites and sips. Pt's wife requesting no PO intake. Per chart, pt's G-tube checked, is appropriately placed and ready to be used.   Spoke with pt and pt's wife at bedside. Pt's wife requesting bolus tube feedings.  RD continue to monitor for tolerance.   Labs reviewed Medications reviewed; multivitamin, Prednisone, vitamin B-12   Diet Order:  Diet NPO time specified  EDUCATION NEEDS:   Education needs have been addressed  Skin:  Skin Assessment: Reviewed RN Assessment  Last BM:  07/21/17  Height:   Ht Readings from Last 1 Encounters:  07/20/17 6' (1.829 m)    Weight:   Wt Readings from  Last 1 Encounters:  07/22/17 167 lb 3.2 oz (75.8 kg)    Ideal Body Weight:  80.9 kg  BMI:  Body mass index is 22.68 kg/m.  Estimated Nutritional Needs:   Kcal:  1850-2150kcal/day   Protein:  111-126g/day   Fluid:  >2L/day  Parks Ranger, MS, RDN, LDN 07/22/2017 4:21 PM

## 2017-07-22 NOTE — Consult Note (Signed)
   [facility] CM Inpatient Consult   07/22/2017  Gabriel Stewart 12-29-1921 [id number]  Patient screened for potential [place] services. Patient is in the [place] of the [place] [facility] services under patient's [facility]  Plan.  Chart reviewed and patient is oriented to self only, admitted with aspiration pneumonia.  Patient's family is pursing a skilled facility for short term rehab needs. No community care management needs noted at this time.  For questions contact:   Natividad Brood, RN BSN [facility] Liaison  [phone] business mobile phone Toll free office [phone]

## 2017-07-22 NOTE — Plan of Care (Signed)
  Elimination: Will not experience complications related to bowel motility 07/22/2017 0102 by Rolm Baptise, RN Note Up to the beside commodes with large bowel.   Safety: Ability to remain free from injury will improve 07/22/2017 0102 by Rolm Baptise, RN Note Bed exit alarm on   Skin Integrity: Risk for impaired skin integrity will decrease 07/22/2017 0102 by Rolm Baptise, RN Note Pink sacrum with cleanser applied skin intact.

## 2017-07-22 NOTE — Progress Notes (Signed)
  Speech Language Pathology Treatment: Dysphagia  Patient Details Name: Gabriel Stewart MRN: [id number] DOB: Dec 19, 1921 Today's Date: 07/22/2017 Time: 7121-9758 SLP Time Calculation (min) (ACUTE ONLY): 16 min  Assessment / Plan / Recommendation Clinical Impression  Chart reviewed and spoke with RN stating that pt is strictly NPO now. Per Dr. Janelle Floor note "Dr. Carles Collet had long discussion with the patient's wife--does not appear ready for comfort feedings/comfort care." Note also states "Discussed with Dr. Carles Collet and his note from 12/30 appreciated. Since G-tube is appropriately placed, spouse wants patient to be completely NPO and wants everything to go through the tube."  SLP spoke with pt and wife (primarily) to confirm choices and ensure understanding. SLP reviewed MBS results per wife's request and recommendation for Dys 2 texture and nectar thick. Explained that he did not aspirate nectar during swallow study however cannot prevent his aspiration risk. Wife confirms he is "okay with not eating/drinking" and it takes him a long time to eat a meal." SLP asked pt if he would want water and ice and he stated "yes, I asked for it earlier." Explained water protocol with likely aspiration of small amounts and risk for infection less with water following guidelines (oral care prior to ice, not during meals). Pt and wife verbalized understanding and wish to proceed.   Pt performed self oral care with toothbrush followed by ice chips with delayed cough. Educated if coughing is excessive and uncomfortable, stop ice at that time. Recommend ice chips 2-3 times a day after oral care, supervised for 10-15 minute period. Will continue to see briefly. If pt wishes to re initiate D2, nectar thick liquids, he may.       HPI HPI: Patient is a 83 y.o. male with PMH: Parkinson's disease, difficulty swallowing, s/p feeding tube, cardiac arrest, syncope, right bundle blockage, melanoma, who presents to hospital with  cough and SOB.       SLP Plan  Continue with current plan of care       Recommendations  Diet recommendations: (ice chips, sips water per pt/wife choice) Liquids provided via: Teaspoon Medication Administration: Crushed with puree Supervision: Patient able to self feed;Full supervision/cueing for compensatory strategies Compensations: Slow rate;Small sips/bites Postural Changes and/or Swallow Maneuvers: Seated upright 90 degrees                Oral Care Recommendations: Oral care prior to ice chip/H20;Oral care BID Follow up Recommendations: 24 hour supervision/assistance SLP Visit Diagnosis: Dysphagia, oropharyngeal phase (R13.12) Plan: Continue with current plan of care                      Gabriel Stewart 07/22/2017, 3:22 PM   Gabriel Stewart.Ed [employer] [phone]

## 2017-07-22 NOTE — Evaluation (Signed)
Physical Therapy Evaluation Patient Details Name: Gabriel Stewart MRN: [id number] DOB: [birth year] Today's Date: 07/22/2017   History of Present Illness  Pt adm with aspiration pneumonitis. PMH - Parkinsons, syncope, melanoma, cardiac arrest  Clinical Impression  Pt admitted with above diagnosis and presents to PT with functional limitations due to deficits listed below (See PT problem list). Pt needs skilled PT to maximize independence and safety to allow discharge to ST-SNF. Feel pt ST-SNF prior to any return to home to allow him to progress to a level that his wife can manage.     Follow Up Recommendations SNF    Equipment Recommendations  None recommended by PT    Recommendations for Other Services       Precautions / Restrictions Precautions Precautions: Fall Restrictions Weight Bearing Restrictions: No      Mobility  Bed Mobility               General bed mobility comments: Pt up in chair  Transfers Overall transfer level: Needs assistance Equipment used: 4-wheeled walker Transfers: Sit to/from Stand Sit to Stand: Min guard         General transfer comment: Assist for balance and safety  Ambulation/Gait Ambulation/Gait assistance: Min assist Ambulation Distance (Feet): 175 Feet Assistive device: 4-wheeled walker Gait Pattern/deviations: Step-to pattern;Decreased step length - right;Decreased step length - left;Shuffle;Festinating Gait velocity: variable due to festinatin   General Gait Details: Assist for balance and support. Verbal cues to incr step length. Pt with difficulty gait and turns  Financial trader Rankin (Stroke Patients Only)       Balance Overall balance assessment: Needs assistance Sitting-balance support: No upper extremity supported;Feet supported Sitting balance-Leahy Scale: Good     Standing balance support: Bilateral upper extremity supported Standing balance-Leahy Scale:  Poor Standing balance comment: rollator and min guard for static standing                             Pertinent Vitals/Pain Pain Assessment: No/denies pain    Home Living Family/patient expects to be discharged to:: Skilled nursing facility Living Arrangements: Spouse/significant other Available Help at Discharge: Family;Available 24 hours/day Type of Home: House Home Access: Level entry     Home Layout: One level Home Equipment: Walker - 4 wheels;Shower seat;Hand held shower head      Prior Function Level of Independence: Needs assistance   Gait / Transfers Assistance Needed: walks with rollator  ADL's / Homemaking Assistance Needed: performs showering, dressing, grooming and feeding modified independently, relies on wife for IADL        Hand Dominance   Dominant Hand: Right    Extremity/Trunk Assessment   Upper Extremity Assessment Upper Extremity Assessment: Defer to OT evaluation    Lower Extremity Assessment Lower Extremity Assessment: Generalized weakness       Communication   Communication: Expressive difficulties(low volume)  Cognition Arousal/Alertness: Awake/alert Behavior During Therapy: WFL for tasks assessed/performed Overall Cognitive Status: History of cognitive impairments - at baseline                                 General Comments: not oriented to place, time, situation      General Comments      Exercises     Assessment/Plan    PT Assessment Patient needs continued  PT services  PT Problem List Decreased strength;Decreased balance;Decreased mobility       PT Treatment Interventions DME instruction;Gait training;Functional mobility training;Therapeutic activities;Therapeutic exercise;Balance training;Patient/family education    PT Goals (Current goals can be found in the Care Plan section)  Acute Rehab PT Goals Patient Stated Goal: return home PT Goal Formulation: With patient Time For Goal  Achievement: 08/05/17 Potential to Achieve Goals: Good    Frequency Min 3X/week   Barriers to discharge        Co-evaluation               AM-PAC PT "6 Clicks" Daily Activity  Outcome Measure Difficulty turning over in bed (including adjusting bedclothes, sheets and blankets)?: A Little Difficulty moving from lying on back to sitting on the side of the bed? : A Little Difficulty sitting down on and standing up from a chair with arms (e.g., wheelchair, bedside commode, etc,.)?: Unable Help needed moving to and from a bed to chair (including a wheelchair)?: A Little Help needed walking in hospital room?: A Little Help needed climbing 3-5 steps with a railing? : A Lot 6 Click Score: 15    End of Session Equipment Utilized During Treatment: Gait belt Activity Tolerance: Patient tolerated treatment well Patient left: in chair;with call bell/phone within reach;with chair alarm set;with family/visitor present;with nursing/sitter in room Nurse Communication: Mobility status PT Visit Diagnosis: Unsteadiness on feet (R26.81);Other abnormalities of gait and mobility (R26.89);Muscle weakness (generalized) (M62.81)    Time: 4742-5956 PT Time Calculation (min) (ACUTE ONLY): 18 min   Charges:   PT Evaluation $PT Eval Moderate Complexity: 1 Mod     PT G Codes:        [facility] PT [place] 07/22/2017, 2:20 PM

## 2017-07-23 ENCOUNTER — Inpatient Hospital Stay (HOSPITAL_COMMUNITY): Payer: Medicare PPO

## 2017-07-23 ENCOUNTER — Encounter (HOSPITAL_COMMUNITY): Payer: Self-pay | Admitting: Surgery

## 2017-07-23 LAB — GLUCOSE, CAPILLARY
GLUCOSE-CAPILLARY: 130 mg/dL — AB (ref 65–99)
GLUCOSE-CAPILLARY: 137 mg/dL — AB (ref 65–99)
Glucose-Capillary: 102 mg/dL — ABNORMAL HIGH (ref 65–99)
Glucose-Capillary: 106 mg/dL — ABNORMAL HIGH (ref 65–99)
Glucose-Capillary: 97 mg/dL (ref 65–99)
Glucose-Capillary: 99 mg/dL (ref 65–99)

## 2017-07-23 MED ORDER — AMOXICILLIN-POT CLAVULANATE 875-125 MG PO TABS
1.0000 | ORAL_TABLET | Freq: Two times a day (BID) | ORAL | Status: DC
  Administered 2017-07-23 – 2017-07-25 (×5): 1
  Filled 2017-07-23 (×5): qty 1

## 2017-07-23 MED ORDER — AMOXICILLIN-POT CLAVULANATE 875-125 MG PO TABS: 1.0000 | ORAL_TABLET | Freq: Two times a day (BID) | ORAL | Status: DC

## 2017-07-23 NOTE — Progress Notes (Signed)
PROGRESS NOTE  Gabriel Stewart WCH:[id number] DOB: [birth year] DOA: 07/20/2017 PCP: Leighton Ruff, MD  Brief History:  84 year old married male with PMH of Parkinson's disease, dysphagia with a PEG tube that has not been used in 2 years, on oral diet but not using thickener consistently, HTN, presented with one-week history of cough and dyspnea. He was admitted for aspiration pneumonitis and respiratory distress. Palliative care consulted, transitioned to DO NOT RESUSCITATE, spouse wishes to have tube feeds only and NPO given aspiration risks. So far tolerating tube feeds. Improving. Possible DC to SNF 07/24/17.  Assessment/Plan: Aspiration pneumonitis - Dr. Lucky Rathke had long discussion with the patient's wife--does not appear ready for comfort feedings/comfort care -Appreciate speech therapy evaluation--> dysphagia 2 diet with nectar thickened liquids >now discontinued based on palliative care input. -The patient will be a high risk for recurrent aspirations and repeat hospitalizations - Completed approximately 4 days of IV antibiotics (clindamycin >Unasyn). Discontinued IV fluids. -Influenza PCR negative -Respiratory viral panel-positive for rhinovirus-likely compounding the patient's current respiratory status - continue robinul - Discussed with Dr. Carles Collet and his note from 12/30 appreciated. Since G-tube is appropriately placed, spouse wants patient to be completely NPO and wants everything to go through the tube. - Started tube feeding per dietitian recommendations, tolerating same, monitor overnight prior to DC to SNF in a.m. - Blood cultures 2: Negative to date. Chest x-ray 07/23/17 personally reviewed and no active disease noted. - Transition to Augmentin via tube to complete total 7 days course. - Improved.  Acute viral bronchitis with Bronchospasm - Chest x-ray on admission without pneumonia. - All his chest findings may be related to acute viral bronchitis/RSV panel positive  for rhinovirus. - Continue duo nebs -de-escalated IV Solu-Medrol>>>po prednisone once daily and wean - Improving and stable.  Parkinson's disease -Continue Sinemet. Stable -Patient follows Dr. Leta Baptist  Moderate protein calorie malnutrition -Continue Ensure -consult nutrition - Had poor po intake, with concerns regarding patient's ability to sustain adequate po intake in the setting of aspiration - Tube feeding started 12/31.  Essential hypertension  -wife states that patient no longer takes lisinopril -BP remains stable off lisinopril  Hyperglycemia -due to steroids. Monitor CBG's, controlled.    Disposition Plan:   SNF when patient tolerating tube feeds, possibly 07/24/17. Palliative care follow-up at SNF. Family Communication:  No family present   Consultants:  Palliative medicine  Code Status:   DO NOT RESUSCITATE  DVT Prophylaxis:  [place] Lovenox   Procedures: As Listed in Progress Note Above  Antibiotics: Unasyn 12/29>>       Subjective: Oriented to self and partly to place. Denies complaints. No chest pain, dyspnea or cough reported. As per RN, tolerating tube feeds in no acute issues reported.  Objective: Vitals:   07/22/17 2019 07/22/17 2043 07/23/17 0427 07/23/17 0804  BP: (!) 159/66  (!) 141/60   Pulse: 74 79 62   Resp: 17 16 16    Temp: 97.8 F (36.6 C)  98.4 F (36.9 C)   TempSrc: Oral  Oral   SpO2: 98% 97% 98% 99%  Weight:   77.6 kg (171 lb)   Height:        Intake/Output Summary (Last 24 hours) at 07/23/2017 1249 Last data filed at 07/23/2017 3536 Gross per 24 hour  Intake 1360 ml  Output 475 ml  Net 885 ml   Weight change: 2.676 kg (5 lb 14.4 oz) Exam:   General:  Elderly male, moderately built  and frail, chronically ill looking, lying propped up in bed without distress.  Cardiovascular: S1 and S2 heard, RRR. No JVD, murmurs or pedal edema. Telemetry personally reviewed: Sinus rhythm with PVCs. Occasional sinus  bradycardia in the 50s.  Respiratory:  Much improved breath sounds. Clear anteriorly. Occasional rhonchi posteriorly but no crackles. No increased work of breathing.  Abdomen: Soft/+BS, non tender, non distended, no guarding. Stable without change.  CNS: Alert and oriented 2. No focal neurological deficits. Stable without change  Extremities: No edema, No lymphangitis, No petechiae, No rashes, no synovitis   Data Reviewed: I have personally reviewed following labs and imaging studies Basic Metabolic Panel: Recent Labs  Lab 07/19/17 2008 07/21/17 0436  NA 139 139  K 4.1 4.3  CL 101 104  CO2 30 27  GLUCOSE 160* 152*  BUN 25* 39*  CREATININE 0.87 0.89  CALCIUM 9.5 8.7*   Liver Function Tests: Recent Labs  Lab 07/19/17 2008  AST 21  ALT 10*  ALKPHOS 105  BILITOT 0.8  PROT 7.4  ALBUMIN 3.5   CBC: Recent Labs  Lab 07/19/17 2008 07/21/17 0436 07/22/17 0730  WBC 10.9* 14.4* 13.4*  NEUTROABS 9.4*  --   --   HGB 12.0* 9.4* 10.8*  HCT 38.2* 29.3* 34.1*  MCV 96.7 95.8 96.3  PLT 288 236 248    Urine analysis:    Component Value Date/Time   COLORURINE YELLOW 07/20/2017 0304   APPEARANCEUR HAZY (A) 07/20/2017 0304   LABSPEC 1.017 07/20/2017 0304   PHURINE 7.0 07/20/2017 0304   GLUCOSEU NEGATIVE 07/20/2017 0304   HGBUR NEGATIVE 07/20/2017 0304   BILIRUBINUR NEGATIVE 07/20/2017 0304   KETONESUR NEGATIVE 07/20/2017 0304   PROTEINUR NEGATIVE 07/20/2017 0304   NITRITE NEGATIVE 07/20/2017 0304   LEUKOCYTESUR NEGATIVE 07/20/2017 0304    Recent Results (from the past 240 hour(s))  Culture, blood (routine x 2) Call MD if unable to obtain prior to antibiotics being given     Status: None (Preliminary result)   Collection Time: 07/20/17  2:30 AM  Result Value Ref Range Status   Specimen Description BLOOD RIGHT HAND  Final   Special Requests IN PEDIATRIC BOTTLE Blood Culture adequate volume  Final   Culture NO GROWTH 2 DAYS  Final   Report Status PENDING  Incomplete    Culture, blood (routine x 2) Call MD if unable to obtain prior to antibiotics being given     Status: None (Preliminary result)   Collection Time: 07/20/17  2:40 AM  Result Value Ref Range Status   Specimen Description BLOOD LEFT ANTECUBITAL  Final   Special Requests   Final    BOTTLES DRAWN AEROBIC AND ANAEROBIC Blood Culture adequate volume   Culture NO GROWTH 2 DAYS  Final   Report Status PENDING  Incomplete  Respiratory Panel by PCR     Status: Abnormal   Collection Time: 07/20/17  2:48 AM  Result Value Ref Range Status   Adenovirus NOT DETECTED NOT DETECTED Final   Coronavirus 229E NOT DETECTED NOT DETECTED Final   Coronavirus HKU1 NOT DETECTED NOT DETECTED Final   Coronavirus NL63 NOT DETECTED NOT DETECTED Final   Coronavirus OC43 NOT DETECTED NOT DETECTED Final   Metapneumovirus NOT DETECTED NOT DETECTED Final   Rhinovirus / Enterovirus DETECTED (A) NOT DETECTED Final   Influenza A NOT DETECTED NOT DETECTED Final   Influenza B NOT DETECTED NOT DETECTED Final   Parainfluenza Virus 1 NOT DETECTED NOT DETECTED Final   Parainfluenza Virus 2 NOT DETECTED  NOT DETECTED Final   Parainfluenza Virus 3 NOT DETECTED NOT DETECTED Final   Parainfluenza Virus 4 NOT DETECTED NOT DETECTED Final   Respiratory Syncytial Virus NOT DETECTED NOT DETECTED Final   Bordetella pertussis NOT DETECTED NOT DETECTED Final   Chlamydophila pneumoniae NOT DETECTED NOT DETECTED Final   Mycoplasma pneumoniae NOT DETECTED NOT DETECTED Final     Scheduled Meds: . carbidopa-levodopa  2 tablet Per Tube TID  . enoxaparin (LOVENOX) injection  40 mg Subcutaneous Q24H  . feeding supplement (JEVITY 1.2 CAL)  474 mL Per Tube TID  . feeding supplement (PRO-STAT SUGAR FREE 64)  30 mL Oral Daily  . free water  300 mL Per Tube Q8H  . glycopyrrolate  1 mg Per Tube TID  . ipratropium-albuterol  3 mL Nebulization BID  . mouth rinse  15 mL Mouth Rinse BID  . multivitamin   Per Tube Daily  . predniSONE  30 mg Per Tube Q  breakfast  . vitamin B-12  1,000 mcg Per Tube Daily   Continuous Infusions: . ampicillin-sulbactam (UNASYN) IV Stopped (07/23/17 0924)    Procedures/Studies: Dg Chest 2 View  Result Date: 07/19/2017 CLINICAL DATA:  Shortness of breath EXAM: CHEST  2 VIEW COMPARISON:  11/20/2016 FINDINGS: No focal pulmonary infiltrate or pleural effusion. Normal cardiomediastinal silhouette with aortic atherosclerosis. No pneumothorax. Mild scarring at the left lung base. Degenerative changes of the spine. IMPRESSION: No active cardiopulmonary disease. Electronically Signed   By: Donavan Foil M.D.   On: 07/19/2017 20:51   Dg Abdomen Peg Tube Location  Result Date: 07/21/2017 CLINICAL DATA:  Percutaneous gastrostomy tube placement. EXAM: ABDOMEN - 1 VIEW COMPARISON:  None. FINDINGS: A small amount of contrast injected through the gastrostomy tube is seen within the stomach and proximal duodenum. Oral contrast material also seen within the colon, likely from recent modified barium swallow. No evidence of dilated bowel loops. IMPRESSION: Percutaneous gastrostomy tube within stomach. Unremarkable bowel gas pattern. Electronically Signed   By: Earle Gell M.D.   On: 07/21/2017 20:06   Dg Chest Port 1 View  Result Date: 07/23/2017 CLINICAL DATA:  Congestion and difficulty breathing EXAM: PORTABLE CHEST 1 VIEW COMPARISON:  07/19/2017 FINDINGS: The heart size and mediastinal contours are within normal limits. Both lungs are clear. The visualized skeletal structures are unremarkable. IMPRESSION: No active disease. Electronically Signed   By: Inez Catalina M.D.   On: 07/23/2017 08:07   Dg Swallowing Func-speech Pathology  Result Date: 07/20/2017 Objective Swallowing Evaluation: Type of Study: MBS-Modified Barium Swallow Study  Patient Details Name: Gabriel Stewart MRN: [id number] Date of Birth: 1921-11-29 Today's Date: 07/20/2017 Time: SLP Start Time (ACUTE ONLY): 32 -SLP Stop Time (ACUTE ONLY): 1250 SLP Time  Calculation (min) (ACUTE ONLY): 20 min Past Medical History: Past Medical History: Diagnosis Date . Cardiac arrest [facility]) 1970's  "did CPR & revived him"  . Dizzy spells   "frequent" . Hypertension  . Kidney stone  . Lyme disease [phone] . Melanoma ([place]) 2010  "back" . Parkinson's disease ([place])  . Pneumonia   "as a kid" . Syncope and collapse 11/24/2014 Past Surgical History: Past Surgical History: Procedure Laterality Date . BLADDER STONE REMOVAL  2015 . CATARACT EXTRACTION W/ INTRAOCULAR LENS  IMPLANT, BILATERAL Bilateral 1970 . CIRCUMCISION  2015 . CYSTOSCOPY W/ STONE MANIPULATION    "never got the stone though" . MELANOMA EXCISION  2010  "back" . TEMPORAL ARTERY BIOPSY / LIGATION  [id number]  "before dx'd w/Lyme's disease" HPI: Patient is a 84  y.o. male with PMH: Parkinson's disease, difficulty swallowing, s/p feeding tube, cardiac arrest, syncope, right bundle blockage, melanoma, who presents to hospital with cough and SOB.  Subjective: pleasant, able to answer questions but with very low vocal intensity Assessment / Plan / Recommendation CHL IP CLINICAL IMPRESSIONS 07/20/2017 Clinical Impression Patient presents with a mod-severe oropharyngeal dysphagia characterized by swallow initiation delays to vallecular sinus for puree, regular and nectar consistencies, and delays to pyriform sinus with thin liquids. Patient with moderate vallecular residuals post initial swallow with puree solids and regular solids, which started to clear (not fully) with cued swallows and use of throat clear and reswallow, as well as chin tuck and reswallow strategies. Patient exhibited penetration and aspiration during and after swallow with thin liquids and did not sense until after penetration or aspiration had already occured. Chin tuck strategy did not help in reducing incidence of penetration or aspiration, but did help with clearance of vallecular sinus and pyriform sinus residuals.  SLP Visit Diagnosis Dysphagia,  oropharyngeal phase (R13.12) Attention and concentration deficit following -- Frontal lobe and executive function deficit following -- Impact on safety and function Moderate aspiration risk   CHL IP TREATMENT RECOMMENDATION 07/20/2017 Treatment Recommendations Therapy as outlined in treatment plan below   Prognosis 07/20/2017 Prognosis for Safe Diet Advancement Fair Barriers to Reach Goals Severity of deficits Barriers/Prognosis Comment -- CHL IP DIET RECOMMENDATION 07/20/2017 SLP Diet Recommendations Dysphagia 2 (Fine chop) solids;Nectar thick liquid Liquid Administration via Cup;No straw Medication Administration Crushed with puree Compensations Minimize environmental distractions;Slow rate;Small sips/bites;Multiple dry swallows after each bite/sip;Clear throat intermittently;Chin tuck Postural Changes Remain semi-upright after after feeds/meals (Comment);Seated upright at 90 degrees   CHL IP OTHER RECOMMENDATIONS 07/20/2017 Recommended Consults -- Oral Care Recommendations Oral care QID Other Recommendations Have oral suction available;Order thickener from pharmacy;Prohibited food (jello, ice cream, thin soups);Remove water pitcher   CHL IP FOLLOW UP RECOMMENDATIONS 07/20/2017 Follow up Recommendations Skilled Nursing facility;Home health SLP;24 hour supervision/assistance   CHL IP FREQUENCY AND DURATION 07/20/2017 Speech Therapy Frequency (ACUTE ONLY) min 2x/week Treatment Duration 1 week      CHL IP ORAL PHASE 07/20/2017 Oral Phase Impaired Oral - Pudding Teaspoon -- Oral - Pudding Cup -- Oral - Honey Teaspoon -- Oral - Honey Cup -- Oral - Nectar Teaspoon -- Oral - Nectar Cup Weak lingual manipulation Oral - Nectar Straw -- Oral - Thin Teaspoon -- Oral - Thin Cup Premature spillage Oral - Thin Straw -- Oral - Puree Weak lingual manipulation Oral - Mech Soft Impaired mastication;Weak lingual manipulation;Piecemeal swallowing;Other (Comment) Oral - Regular -- Oral - Multi-Consistency -- Oral - Pill -- Oral Phase  - Comment --  CHL IP PHARYNGEAL PHASE 07/20/2017 Pharyngeal Phase Impaired Pharyngeal- Pudding Teaspoon -- Pharyngeal -- Pharyngeal- Pudding Cup -- Pharyngeal -- Pharyngeal- Honey Teaspoon -- Pharyngeal -- Pharyngeal- Honey Cup -- Pharyngeal -- Pharyngeal- Nectar Teaspoon -- Pharyngeal -- Pharyngeal- Nectar Cup Delayed swallow initiation-vallecula;Pharyngeal residue - valleculae;Compensatory strategies attempted (with notebox) Pharyngeal -- Pharyngeal- Nectar Straw -- Pharyngeal -- Pharyngeal- Thin Teaspoon Delayed swallow initiation-vallecula;Delayed swallow initiation-pyriform sinuses;Penetration/Aspiration during swallow Pharyngeal Material enters airway, remains ABOVE vocal cords then ejected out Pharyngeal- Thin Cup Delayed swallow initiation-pyriform sinuses;Delayed swallow initiation-vallecula;Penetration/Aspiration during swallow;Reduced airway/laryngeal closure;Compensatory strategies attempted (with notebox) Pharyngeal Material enters airway, passes BELOW cords and not ejected out despite cough attempt by patient;Material enters airway, passes BELOW cords without attempt by patient to eject out (silent aspiration) Pharyngeal- Thin Straw -- Pharyngeal -- Pharyngeal- Puree Pharyngeal residue - valleculae;Compensatory strategies attempted (with notebox)  Pharyngeal -- Pharyngeal- Mechanical Soft Delayed swallow initiation-vallecula;Pharyngeal residue - valleculae Pharyngeal -- Pharyngeal- Regular -- Pharyngeal -- Pharyngeal- Multi-consistency -- Pharyngeal -- Pharyngeal- Pill -- Pharyngeal -- Pharyngeal Comment --  No flowsheet data found. CHL IP GO 05/27/2016 Functional Assessment Tool Used clinical judgment Functional Limitations Swallowing Swallow Current Status [phone]) CI Swallow Goal Status (C4888) CI Swallow Discharge Status (B1694) CI Motor Speech Current Status (H0388) (None) Motor Speech Goal Status (E2800) (None) Motor Speech Goal Status (L4917) (None) Spoken Language Comprehension Current Status  (H1505) (None) Spoken Language Comprehension Goal Status (W9794) (None) Spoken Language Comprehension Discharge Status [phone]) (None) Spoken Language Expression Current Status [phone]) (None) Spoken Language Expression Goal Status (O7078) (None) Spoken Language Expression Discharge Status [phone]) (None) Attention Current Status (B2010) (None) Attention Goal Status (O7121) (None) Attention Discharge Status [phone]) (None) Memory Current Status (T2549) (None) Memory Goal Status (I2641) (None) Memory Discharge Status (R8309) (None) Voice Current Status (M0768) (None) Voice Goal Status (G8811) (None) Voice Discharge Status (S3159) (None) Other Speech-Language Pathology Functional Limitation Current Status (Y5859) (None) Other Speech-Language Pathology Functional Limitation Goal Status (Y9244) (None) Other Speech-Language Pathology Functional Limitation Discharge Status [phone]) (None) Sonia Baller, MA, CCC-SLP 07/20/17 3:00 PM               Vernell Leep, MD, FACP, [facility]. [facility] Pager [phone]  If 7PM-7AM, please contact night-coverage [email or web address] Password [email or web address] 07/23/2017, 12:49 PM   LOS: 3 days

## 2017-07-23 NOTE — Progress Notes (Signed)
Patients bed alarm was on, RN walked in room around 2am to give patient medicine, patient not in bed, patient found in bathroom trying to clean himself after having an accident in bed. Patient was found standing in bathroom removing dirty gown. RN and NT helped patient back to bed and found bed alarm near patients leg area. Last time patient assessed- alarm was at bottom of bed in appropriate place. Bed alarm placed on a more sensitive setting. Bed alarm went of not to long after this event and patient seen turning bed alarm off. Patient instructed about safety and adhering to safety plan. Will continue to monitor.

## 2017-07-23 NOTE — Plan of Care (Signed)
  Safety: Ability to remain free from injury will improve 07/23/2017 0448 - Progressing by Tristan Schroeder, RN

## 2017-07-24 LAB — GLUCOSE, CAPILLARY
GLUCOSE-CAPILLARY: 103 mg/dL — AB (ref 65–99)
GLUCOSE-CAPILLARY: 91 mg/dL (ref 65–99)
GLUCOSE-CAPILLARY: 99 mg/dL (ref 65–99)
Glucose-Capillary: 87 mg/dL (ref 65–99)
Glucose-Capillary: 175 mg/dL — ABNORMAL HIGH (ref 65–99)
Glucose-Capillary: 133 mg/dL — ABNORMAL HIGH (ref 65–99)

## 2017-07-24 MED ORDER — PREDNISONE 20 MG PO TABS
30.0000 mg | ORAL_TABLET | Freq: Every day | ORAL | Status: DC
  Administered 2017-07-25: 10:00:00 30 mg
  Filled 2017-07-24: qty 1

## 2017-07-24 NOTE — Progress Notes (Signed)
Capillary blood sugar tends to drop significantly without over night tube feeding. Still WNL.

## 2017-07-24 NOTE — Care Management Important Message (Signed)
Important Message  Patient Details  Name: Gabriel Stewart MRN: [id number] Date of Birth: July 22, 1922   Medicare Important Message Given:  Yes    Orbie Pyo 07/24/2017, 2:21 PM

## 2017-07-24 NOTE — Telephone Encounter (Signed)
I have done the paper work for BOTOX 100 units for hypersialorrhea,  Please put him on schedule

## 2017-07-24 NOTE — Progress Notes (Signed)
PROGRESS NOTE  Gabriel Stewart MPN:[id number] DOB: 1922-02-19 DOA: 07/20/2017 PCP: Leighton Ruff, MD  Brief History:  84 year old married male with PMH of Parkinson's disease, dysphagia with a PEG tube that has not been used in 2 years, on oral diet but not using thickener consistently, HTN, presented with one-week history of cough and dyspnea. He was admitted for aspiration pneumonitis and respiratory distress. Palliative care consulted, transitioned to DO NOT RESUSCITATE, spouse wishes to have tube feeds only and NPO given aspiration risks. So far tolerating tube feeds. Improving. DC to SNF pending bed availability and insurance approval (Education officer, museum following).  Assessment/Plan: Aspiration pneumonitis - Dr. Lucky Rathke had long discussion with the patient's wife--does not appear ready for comfort feedings/comfort care -Appreciate speech therapy evaluation--> dysphagia 2 diet with nectar thickened liquids >now discontinued based on palliative care input. -The patient will be a high risk for recurrent aspirations and repeat hospitalizations - Completed approximately 4 days of IV antibiotics (clindamycin >Unasyn). Discontinued IV fluids. -Influenza PCR negative -Respiratory viral panel-positive for rhinovirus-likely compounding the patient's current respiratory status - continue robinul - Discussed with Dr. Carles Collet and his note from 12/30 appreciated. Since G-tube is appropriately placed, spouse wants patient to be completely NPO and wants everything to go through the tube. - Started tube feeding per dietitian recommendations, tolerating same, monitor overnight prior to DC to SNF in a.m. - Blood cultures 2: Negative to date. Chest x-ray 07/23/17 personally reviewed and no active disease noted. - Transition to Augmentin via tube to complete total 7 days course. - Improved and stable.  Acute viral bronchitis with Bronchospasm - Chest x-ray on admission without pneumonia. - All his chest  findings may be related to acute viral bronchitis/RSV panel positive for rhinovirus. - Continue duo nebs -de-escalated IV Solu-Medrol>>>po prednisone once daily and wean - Improving and stable.  Parkinson's disease -Continue Sinemet. Stable -Patient follows Dr. Leta [place]  Moderate protein calorie malnutrition -Continue Ensure -consult nutrition - Had poor po intake, with concerns regarding patient's ability to sustain adequate po intake in the setting of aspiration - Tube feeding started 12/31.  Essential hypertension  -wife states that patient no longer takes lisinopril -BP remains stable off lisinopril  Hyperglycemia -due to steroids. Monitor CBG's, controlled.    Disposition Plan:  DC to SNF pending bed availability and insurance approval (Education officer, museum following). Palliative care follow-up at SNF. Family Communication:  No family present   Consultants:  Palliative medicine  Code Status:   DO NOT RESUSCITATE  DVT Prophylaxis:  [facility] [facility] Lovenox   Procedures: As Listed in Progress Note Above  Antibiotics: Unasyn 12/29>>       Subjective: Denies complaints. No pain, dyspnea. As per RN, no acute issues and tolerating tube feeds.  Objective: Vitals:   07/23/17 2133 07/24/17 0431 07/24/17 0934 07/24/17 1145  BP:  (!) 155/71  134/63  Pulse: 64 (!) 52  72  Resp: 16 18    Temp:  (!) 97.5 F (36.4 C)    TempSrc:  Oral    SpO2: 98% 98% 95% 97%  Weight:  77.2 kg (170 lb 1.6 oz)    Height:        Intake/Output Summary (Last 24 hours) at 07/24/2017 1735 Last data filed at 07/24/2017 1151 Gross per 24 hour  Intake 840 ml  Output 1270 ml  Net -430 ml   Weight change: -0.408 kg (-14.4 oz) Exam:   General:  Elderly male, moderately built and frail,  chronically ill looking, sitting up comfortably in reclining chair this morning.  Cardiovascular: S1 and S2 heard, RRR. No JVD, murmurs or pedal edema.   Respiratory:  Clear to auscultation except  occasional rhonchi posteriorly. No increased work of breathing.  Abdomen: Soft/+BS, non tender, non distended, no guarding. Stable without change.  CNS: Alert and oriented 2. No focal neurological deficits. Stable without change  Extremities: No edema, No lymphangitis, No petechiae, No rashes, no synovitis   Data Reviewed: I have personally reviewed following labs and imaging studies Basic Metabolic Panel: Recent Labs  Lab 07/19/17 2008 07/21/17 0436  NA 139 139  K 4.1 4.3  CL 101 104  CO2 30 27  GLUCOSE 160* 152*  BUN 25* 39*  CREATININE 0.87 0.89  CALCIUM 9.5 8.7*   Liver Function Tests: Recent Labs  Lab 07/19/17 2008  AST 21  ALT 10*  ALKPHOS 105  BILITOT 0.8  PROT 7.4  ALBUMIN 3.5   CBC: Recent Labs  Lab 07/19/17 2008 07/21/17 0436 07/22/17 0730  WBC 10.9* 14.4* 13.4*  NEUTROABS 9.4*  --   --   HGB 12.0* 9.4* 10.8*  HCT 38.2* 29.3* 34.1*  MCV 96.7 95.8 96.3  PLT 288 236 248    Urine analysis:    Component Value Date/Time   COLORURINE YELLOW 07/20/2017 0304   APPEARANCEUR HAZY (A) 07/20/2017 0304   LABSPEC 1.017 07/20/2017 0304   PHURINE 7.0 07/20/2017 0304   GLUCOSEU NEGATIVE 07/20/2017 0304   HGBUR NEGATIVE 07/20/2017 0304   BILIRUBINUR NEGATIVE 07/20/2017 0304   KETONESUR NEGATIVE 07/20/2017 0304   PROTEINUR NEGATIVE 07/20/2017 0304   NITRITE NEGATIVE 07/20/2017 0304   LEUKOCYTESUR NEGATIVE 07/20/2017 0304    Recent Results (from the past 240 hour(s))  Culture, blood (routine x 2) Call MD if unable to obtain prior to antibiotics being given     Status: None (Preliminary result)   Collection Time: 07/20/17  2:30 AM  Result Value Ref Range Status   Specimen Description BLOOD RIGHT HAND  Final   Special Requests IN PEDIATRIC BOTTLE Blood Culture adequate volume  Final   Culture NO GROWTH 4 DAYS  Final   Report Status PENDING  Incomplete  Culture, blood (routine x 2) Call MD if unable to obtain prior to antibiotics being given     Status:  None (Preliminary result)   Collection Time: 07/20/17  2:40 AM  Result Value Ref Range Status   Specimen Description BLOOD LEFT ANTECUBITAL  Final   Special Requests   Final    BOTTLES DRAWN AEROBIC AND ANAEROBIC Blood Culture adequate volume   Culture NO GROWTH 4 DAYS  Final   Report Status PENDING  Incomplete  Respiratory Panel by PCR     Status: Abnormal   Collection Time: 07/20/17  2:48 AM  Result Value Ref Range Status   Adenovirus NOT DETECTED NOT DETECTED Final   Coronavirus 229E NOT DETECTED NOT DETECTED Final   Coronavirus HKU1 NOT DETECTED NOT DETECTED Final   Coronavirus NL63 NOT DETECTED NOT DETECTED Final   Coronavirus OC43 NOT DETECTED NOT DETECTED Final   Metapneumovirus NOT DETECTED NOT DETECTED Final   Rhinovirus / Enterovirus DETECTED (A) NOT DETECTED Final   Influenza A NOT DETECTED NOT DETECTED Final   Influenza B NOT DETECTED NOT DETECTED Final   Parainfluenza Virus 1 NOT DETECTED NOT DETECTED Final   Parainfluenza Virus 2 NOT DETECTED NOT DETECTED Final   Parainfluenza Virus 3 NOT DETECTED NOT DETECTED Final   Parainfluenza Virus 4 NOT  DETECTED NOT DETECTED Final   Respiratory Syncytial Virus NOT DETECTED NOT DETECTED Final   Bordetella pertussis NOT DETECTED NOT DETECTED Final   Chlamydophila pneumoniae NOT DETECTED NOT DETECTED Final   Mycoplasma pneumoniae NOT DETECTED NOT DETECTED Final     Scheduled Meds: . amoxicillin-clavulanate  1 tablet Per Tube Q12H  . carbidopa-levodopa  2 tablet Per Tube TID  . enoxaparin (LOVENOX) injection  40 mg Subcutaneous Q24H  . feeding supplement (JEVITY 1.2 CAL)  474 mL Per Tube TID  . feeding supplement (PRO-STAT SUGAR FREE 64)  30 mL Oral Daily  . free water  300 mL Per Tube Q8H  . glycopyrrolate  1 mg Per Tube TID  . ipratropium-albuterol  3 mL Nebulization BID  . mouth rinse  15 mL Mouth Rinse BID  . multivitamin   Per Tube Daily  . predniSONE  30 mg Per Tube Q breakfast  . vitamin B-12  1,000 mcg Per Tube Daily    Continuous Infusions:   Procedures/Studies: Dg Chest 2 View  Result Date: 07/19/2017 CLINICAL DATA:  Shortness of breath EXAM: CHEST  2 VIEW COMPARISON:  11/20/2016 FINDINGS: No focal pulmonary infiltrate or pleural effusion. Normal cardiomediastinal silhouette with aortic atherosclerosis. No pneumothorax. Mild scarring at the left lung base. Degenerative changes of the spine. IMPRESSION: No active cardiopulmonary disease. Electronically Signed   By: Donavan Foil M.D.   On: 07/19/2017 20:51   Dg Abdomen Peg Tube Location  Result Date: 07/21/2017 CLINICAL DATA:  Percutaneous gastrostomy tube placement. EXAM: ABDOMEN - 1 VIEW COMPARISON:  None. FINDINGS: A small amount of contrast injected through the gastrostomy tube is seen within the stomach and proximal duodenum. Oral contrast material also seen within the colon, likely from recent modified barium swallow. No evidence of dilated bowel loops. IMPRESSION: Percutaneous gastrostomy tube within stomach. Unremarkable bowel gas pattern. Electronically Signed   By: Earle Gell M.D.   On: 07/21/2017 20:06   Dg Chest Port 1 View  Result Date: 07/23/2017 CLINICAL DATA:  Congestion and difficulty breathing EXAM: PORTABLE CHEST 1 VIEW COMPARISON:  07/19/2017 FINDINGS: The heart size and mediastinal contours are within normal limits. Both lungs are clear. The visualized skeletal structures are unremarkable. IMPRESSION: No active disease. Electronically Signed   By: Inez Catalina M.D.   On: 07/23/2017 08:07   Dg Swallowing Func-speech Pathology  Result Date: 07/20/2017 Objective Swallowing Evaluation: Type of Study: MBS-Modified Barium Swallow Study  Patient Details Name: AASHISH HAMM MRN: [id number] Date of Birth: 1922-07-17 Today's Date: 07/20/2017 Time: SLP Start Time (ACUTE ONLY): 44 -SLP Stop Time (ACUTE ONLY): 1250 SLP Time Calculation (min) (ACUTE ONLY): 20 min Past Medical History: Past Medical History: Diagnosis Date . Cardiac arrest [facility])  1970's  "did CPR & revived him"  . Dizzy spells   "frequent" . Hypertension  . Kidney stone  . Lyme disease [phone] . Melanoma ([place]) 2010  "back" . Parkinson's disease ([place])  . Pneumonia   "as a kid" . Syncope and collapse 11/24/2014 Past Surgical History: Past Surgical History: Procedure Laterality Date . BLADDER STONE REMOVAL  2015 . CATARACT EXTRACTION W/ INTRAOCULAR LENS  IMPLANT, BILATERAL Bilateral 1970 . CIRCUMCISION  2015 . CYSTOSCOPY W/ STONE MANIPULATION    "never got the stone though" . MELANOMA EXCISION  2010  "back" . TEMPORAL ARTERY BIOPSY / LIGATION  [id number]'Y  "before dx'd w/Lyme's disease" HPI: Patient is a 84 y.o. male with PMH: Parkinson's disease, difficulty swallowing, s/p feeding tube, cardiac arrest, syncope, right bundle blockage, melanoma,  who presents to hospital with cough and SOB.  Subjective: pleasant, able to answer questions but with very low vocal intensity Assessment / Plan / Recommendation CHL IP CLINICAL IMPRESSIONS 07/20/2017 Clinical Impression Patient presents with a mod-severe oropharyngeal dysphagia characterized by swallow initiation delays to vallecular sinus for puree, regular and nectar consistencies, and delays to pyriform sinus with thin liquids. Patient with moderate vallecular residuals post initial swallow with puree solids and regular solids, which started to clear (not fully) with cued swallows and use of throat clear and reswallow, as well as chin tuck and reswallow strategies. Patient exhibited penetration and aspiration during and after swallow with thin liquids and did not sense until after penetration or aspiration had already occured. Chin tuck strategy did not help in reducing incidence of penetration or aspiration, but did help with clearance of vallecular sinus and pyriform sinus residuals.  SLP Visit Diagnosis Dysphagia, oropharyngeal phase (R13.12) Attention and concentration deficit following -- Frontal lobe and executive function deficit following  -- Impact on safety and function Moderate aspiration risk   CHL IP TREATMENT RECOMMENDATION 07/20/2017 Treatment Recommendations Therapy as outlined in treatment plan below   Prognosis 07/20/2017 Prognosis for Safe Diet Advancement Fair Barriers to Reach Goals Severity of deficits Barriers/Prognosis Comment -- CHL IP DIET RECOMMENDATION 07/20/2017 SLP Diet Recommendations Dysphagia 2 (Fine chop) solids;Nectar thick liquid Liquid Administration via Cup;No straw Medication Administration Crushed with puree Compensations Minimize environmental distractions;Slow rate;Small sips/bites;Multiple dry swallows after each bite/sip;Clear throat intermittently;Chin tuck Postural Changes Remain semi-upright after after feeds/meals (Comment);Seated upright at 90 degrees   CHL IP OTHER RECOMMENDATIONS 07/20/2017 Recommended Consults -- Oral Care Recommendations Oral care QID Other Recommendations Have oral suction available;Order thickener from pharmacy;Prohibited food (jello, ice cream, thin soups);Remove water pitcher   CHL IP FOLLOW UP RECOMMENDATIONS 07/20/2017 Follow up Recommendations Skilled Nursing facility;Home health SLP;24 hour supervision/assistance   CHL IP FREQUENCY AND DURATION 07/20/2017 Speech Therapy Frequency (ACUTE ONLY) min 2x/week Treatment Duration 1 week      CHL IP ORAL PHASE 07/20/2017 Oral Phase Impaired Oral - Pudding Teaspoon -- Oral - Pudding Cup -- Oral - Honey Teaspoon -- Oral - Honey Cup -- Oral - Nectar Teaspoon -- Oral - Nectar Cup Weak lingual manipulation Oral - Nectar Straw -- Oral - Thin Teaspoon -- Oral - Thin Cup Premature spillage Oral - Thin Straw -- Oral - Puree Weak lingual manipulation Oral - Mech Soft Impaired mastication;Weak lingual manipulation;Piecemeal swallowing;Other (Comment) Oral - Regular -- Oral - Multi-Consistency -- Oral - Pill -- Oral Phase - Comment --  CHL IP PHARYNGEAL PHASE 07/20/2017 Pharyngeal Phase Impaired Pharyngeal- Pudding Teaspoon -- Pharyngeal -- Pharyngeal-  Pudding Cup -- Pharyngeal -- Pharyngeal- Honey Teaspoon -- Pharyngeal -- Pharyngeal- Honey Cup -- Pharyngeal -- Pharyngeal- Nectar Teaspoon -- Pharyngeal -- Pharyngeal- Nectar Cup Delayed swallow initiation-vallecula;Pharyngeal residue - valleculae;Compensatory strategies attempted (with notebox) Pharyngeal -- Pharyngeal- Nectar Straw -- Pharyngeal -- Pharyngeal- Thin Teaspoon Delayed swallow initiation-vallecula;Delayed swallow initiation-pyriform sinuses;Penetration/Aspiration during swallow Pharyngeal Material enters airway, remains ABOVE vocal cords then ejected out Pharyngeal- Thin Cup Delayed swallow initiation-pyriform sinuses;Delayed swallow initiation-vallecula;Penetration/Aspiration during swallow;Reduced airway/laryngeal closure;Compensatory strategies attempted (with notebox) Pharyngeal Material enters airway, passes BELOW cords and not ejected out despite cough attempt by patient;Material enters airway, passes BELOW cords without attempt by patient to eject out (silent aspiration) Pharyngeal- Thin Straw -- Pharyngeal -- Pharyngeal- Puree Pharyngeal residue - valleculae;Compensatory strategies attempted (with notebox) Pharyngeal -- Pharyngeal- Mechanical Soft Delayed swallow initiation-vallecula;Pharyngeal residue - valleculae Pharyngeal -- Pharyngeal- Regular -- Pharyngeal --  Pharyngeal- Multi-consistency -- Pharyngeal -- Pharyngeal- Pill -- Pharyngeal -- Pharyngeal Comment --  No flowsheet data found. CHL IP GO 05/27/2016 Functional Assessment Tool Used clinical judgment Functional Limitations Swallowing Swallow Current Status [phone]) CI Swallow Goal Status (I9518) CI Swallow Discharge Status (A4166) CI Motor Speech Current Status (A6301) (None) Motor Speech Goal Status (S0109) (None) Motor Speech Goal Status (N2355) (None) Spoken Language Comprehension Current Status (D3220) (None) Spoken Language Comprehension Goal Status (U5427) (None) Spoken Language Comprehension Discharge Status [phone]) (None)  Spoken Language Expression Current Status [phone]) (None) Spoken Language Expression Goal Status (D1761) (None) Spoken Language Expression Discharge Status [phone]) (None) Attention Current Status (T0626) (None) Attention Goal Status (R4854) (None) Attention Discharge Status [phone]) (None) Memory Current Status (J0093) (None) Memory Goal Status (G1829) (None) Memory Discharge Status (H3716) (None) Voice Current Status (R6789) (None) Voice Goal Status (F8101) (None) Voice Discharge Status (B5102) (None) Other Speech-Language Pathology Functional Limitation Current Status (H8527) (None) Other Speech-Language Pathology Functional Limitation Goal Status (P8242) (None) Other Speech-Language Pathology Functional Limitation Discharge Status [phone]) (None) Sonia Baller, MA, CCC-SLP 07/20/17 3:00 PM               Vernell Leep, MD, FACP, [facility]. [facility] Pager [phone]  If 7PM-7AM, please contact night-coverage [email or web address] Password [email or web address] 07/24/2017, 5:35 PM   LOS: 4 days

## 2017-07-24 NOTE — Clinical Social Work Note (Addendum)
CSW sent out SNF referral. Left message for admissions coordinator at [facility] to see if they have any male beds available today and if patient is appropriate for their facility. CSW faxed clinicals to [facility] for authorization review.  Dayton Scrape, [place] [phone]  9:38 am [facility] does not have a bed available. Wife not at bedside. CSW called her to inform. She will be at the hospital around 11:00 this morning and will review other options. Patient currently has bed offers from [facility], [place].  Dayton Scrape, [place] [phone]  10:53 am [facility] confirmed they received the clinicals that were faxed this morning. Today's PT note also faxed over.  Dayton Scrape, [place]  12:50 pm [facility] had a semiprivate bed open up today. Patient's wife is agreeable as long as he can get on the list for a private room is available. [place] notified. Discussed PTAR with patient's wife. She would prefer to drive him to the facility due to no oxygen requirements and patient is walking 175 feet and would likely not qualify for insurance to cover the ride.  Dayton Scrape, [place]

## 2017-07-24 NOTE — Plan of Care (Signed)
  Health Behavior/Discharge Planning: Ability to manage health-related needs will improve 07/24/2017 0105 - Adequate for Discharge by Tristan Schroeder, RN   Skin Integrity: Risk for impaired skin integrity will decrease 07/24/2017 0105 - Adequate for Discharge by Tristan Schroeder, RN   Clinical Measurements: Ability to maintain clinical measurements within normal limits will improve 07/24/2017 0105 - Adequate for Discharge by Tristan Schroeder, RN

## 2017-07-24 NOTE — Progress Notes (Signed)
Patient ID: Gabriel Stewart, male   DOB: 06-14-1922, 84 y.o.   MRN: [id number]  This NP visited patient at the bedside as a follow for palliative medicine needs and emotional support.  Wife at bedside.  Patient is out of bed to the chair, he denies pain or discomfort.  Is more alert today and engaged in conversation.  His speech is weak but understandable,   he still has audible throat secretions  Plan continues to be:  -tube feeds via PEG, continue n.p.o. status -discharge to skilled nursing facility for rehab with palliative services to follow -family planning for  anticipated home care needs once patient returns home from SNF -Educated family on hospice benefit  Again we discussed the  natural trajectory and expectations of end-stage Parkinson's disease.  Discussed with family  the importance of continued conversation with family and their  medical providers regarding overall plan of care and treatment options,  ensuring decisions are within the context of the patients values and GOCs.  Questions and concerns addressed  Time in  0930         Time out   1055       Total time spent on the unit was 25 minutes  Greater than 50% of the time was spent in counseling and coordination of care  Wadie Lessen NP  Palliative Medicine Team Team Phone # [phone] Pager [phone]

## 2017-07-24 NOTE — NC FL2 (Signed)
[place] LEVEL OF CARE SCREENING TOOL     IDENTIFICATION  Patient Name: Gabriel Stewart Birthdate: 03-Feb-1922 Sex: male Admission Date (Current Location): 07/20/2017  [facility] and [place] Number:  Herbalist and Address:  [facility] [place], [place] [address] [place], [place] [address]      Provider Number: [id number]  Attending Physician Name and Address:  Modena Jansky, MD  Relative Name and Phone Number:       Current Level of Care: Hospital Recommended Level of Care: [place] Prior Approval Number:    Date Approved/Denied:   PASRR Number: [id number]  Discharge Plan: SNF    Current Diagnoses: Patient Active Problem List   Diagnosis Date Noted  . DNR (do not resuscitate)   . Palliative care by specialist   . Weakness generalized   . Malnutrition of moderate degree 07/21/2017  . Moderate malnutrition ([place]) 07/21/2017  . Cough 07/20/2017  . Aspiration into airway 07/20/2017  . Aspiration pneumonitis ([place]) 07/20/2017  . Shortness of breath   . At high risk for aspiration   . Chest pain 05/26/2016  . Parkinson disease ([place]) 07/19/2015  . Sialorrhea 07/19/2015  . Subdural hematoma S/P Craniotomy and drainage 05/17/2015  . Dysphagia 05/17/2015  . Hypertension 05/17/2015  . BPH (benign prostatic hyperplasia) 05/17/2015  . Syncope 11/24/2014  . RBBB 11/24/2014  . Orthostatic hypotension 11/24/2014  . Dehydration 11/24/2014  . Unintentional weight loss 11/24/2014  . Syncope and collapse 11/24/2014  . Parkinson's disease ([place]) 11/20/2013  . Essential tremor 11/20/2013  . Gait difficulty 11/20/2013  . Swallowing difficulty 11/20/2013  . Hoarse voice quality 11/20/2013    Orientation RESPIRATION BLADDER Height & Weight     Self  Normal Continent, External catheter Weight: 170 lb 1.6 oz (77.2 kg) Height:  6' (182.9 cm)  BEHAVIORAL SYMPTOMS/MOOD NEUROLOGICAL BOWEL NUTRITION STATUS  (None)  (Parkinson's) Continent Feeding tube(Peg)  AMBULATORY STATUS COMMUNICATION OF NEEDS Skin   Limited Assist Verbally Bruising, Other (Comment)(Catheter entry/exit)                       Personal Care Assistance Level of Assistance  Bathing, Feeding, Dressing Bathing Assistance: Limited assistance Feeding assistance: Limited assistance Dressing Assistance: Limited assistance     Functional Limitations Info  Sight, Hearing, Speech Sight Info: Adequate Hearing Info: Adequate Speech Info: Adequate(Hoarse)    SPECIAL CARE FACTORS FREQUENCY  PT (By licensed PT), OT (By licensed OT), Blood pressure     PT Frequency: 5 x week OT Frequency: 5 x week            Contractures Contractures Info: Not present    Additional Factors Info  Code Status, Allergies Code Status Info: DNR Allergies Info: Novocain (Procaine)           Current Medications (07/24/2017):  This is the current hospital active medication list Current Facility-Administered Medications  Medication Dose Route Frequency Provider Last Rate Last Dose  . acetaminophen (TYLENOL) solution 650 mg  650 mg Per Tube Q6H PRN Hongalgi, Anand D, MD      . albuterol (PROVENTIL) (2.5 MG/3ML) 0.083% nebulizer solution 5 mg  5 mg Nebulization Q4H PRN Ivor Costa, MD      . amoxicillin-clavulanate (AUGMENTIN) 875-125 MG per tablet 1 tablet  1 tablet Per Tube Q12H Modena Jansky, MD   1 tablet at 07/23/17 2117  . carbidopa-levodopa (SINEMET IR) 25-100 MG per tablet immediate release 2 tablet  2  tablet Per Tube TID Modena Jansky, MD   2 tablet at 07/23/17 2117  . enoxaparin (LOVENOX) injection 40 mg  40 mg Subcutaneous Q24H Ivor Costa, MD   40 mg at 07/23/17 1457  . feeding supplement (JEVITY 1.2 CAL) liquid 474 mL  474 mL Per Tube TID Modena Jansky, MD   474 mL at 07/23/17 1618  . feeding supplement (PRO-STAT SUGAR FREE 64) liquid 30 mL  30 mL Oral Daily Vernell Leep D, MD   30 mL at 07/23/17 1051  . free water 300 mL   300 mL Per Tube Q8H Hongalgi, Lenis Dickinson, MD   300 mL at 07/24/17 0606  . glycopyrrolate (ROBINUL) tablet 1 mg  1 mg Per Tube TID Modena Jansky, MD   1 mg at 07/23/17 2117  . guaiFENesin (ROBITUSSIN) 100 MG/5ML solution 200 mg  200 mg Per Tube Q4H PRN Ivor Costa, MD      . hydrALAZINE (APRESOLINE) injection 5 mg  5 mg Intravenous Q2H PRN Ivor Costa, MD      . ipratropium-albuterol (DUONEB) 0.5-2.5 (3) MG/3ML nebulizer solution 3 mL  3 mL Nebulization BID Tat, Shanon Brow, MD   3 mL at 07/23/17 2132  . MEDLINE mouth rinse  15 mL Mouth Rinse BID Ivor Costa, MD   15 mL at 07/23/17 2117  . multivitamin liquid   Per Tube Daily Hongalgi, Lenis Dickinson, MD      . ondansetron [facility]) injection 4 mg  4 mg Intravenous Q8H PRN Ivor Costa, MD      . predniSONE (DELTASONE) tablet 30 mg  30 mg Per Tube Q breakfast Modena Jansky, MD   30 mg at 07/24/17 0606  . vitamin B-12 (CYANOCOBALAMIN) tablet 1,000 mcg  1,000 mcg Per Tube Daily Modena Jansky, MD   1,000 mcg at 07/23/17 1052  . zolpidem (AMBIEN) tablet 5 mg  5 mg Per Tube QHS PRN Ivor Costa, MD         Discharge Medications: Please see discharge summary for a list of discharge medications.  Relevant Imaging Results:  Relevant Lab Results:   Additional Information SS#: [id number]  Candie Chroman, LCSW

## 2017-07-24 NOTE — Progress Notes (Signed)
Physical Therapy Treatment Patient Details Name: Gabriel Stewart MRN: [id number] DOB: 1922/04/18 Today's Date: 07/24/2017    History of Present Illness Pt adm with aspiration pneumonitis. PMH - Parkinsons, syncope, melanoma, cardiac arrest    PT Comments    Pt making steady progress. Continue to recommend SNF for further rehab.   Follow Up Recommendations  SNF     Equipment Recommendations  None recommended by PT    Recommendations for Other Services       Precautions / Restrictions Precautions Precautions: Fall Restrictions Weight Bearing Restrictions: No    Mobility  Bed Mobility Overal bed mobility: Needs Assistance Bed Mobility: Supine to Sit     Supine to sit: Supervision;HOB elevated     General bed mobility comments: Incr time and use of rails  Transfers Overall transfer level: Needs assistance Equipment used: 4-wheeled walker Transfers: Sit to/from Stand Sit to Stand: Min guard         General transfer comment: Assist for balance and safety  Ambulation/Gait Ambulation/Gait assistance: Min assist Ambulation Distance (Feet): 175 Feet Assistive device: 4-wheeled walker Gait Pattern/deviations: Step-to pattern;Decreased step length - right;Decreased step length - left;Shuffle;Festinating Gait velocity: variable due to festinating   General Gait Details: Assist for balance and support. Verbal cues to incr step length. Pt with difficulty gait and turns. At times pt coming to a complete stop and difficulty reinitiating gait. Amb on RA with SpO2 94% after amb. Dyspnea 3/4.   Stairs            Wheelchair Mobility    Modified Rankin (Stroke Patients Only)       Balance Overall balance assessment: Needs assistance Sitting-balance support: No upper extremity supported;Feet supported Sitting balance-Leahy Scale: Good     Standing balance support: Bilateral upper extremity supported Standing balance-Leahy Scale: Poor Standing balance  comment: rollator and min guard for static standing                            Cognition Arousal/Alertness: Awake/alert Behavior During Therapy: WFL for tasks assessed/performed Overall Cognitive Status: History of cognitive impairments - at baseline                                 General Comments: not oriented to place, time, situation      Exercises      General Comments        Pertinent Vitals/Pain Pain Assessment: No/denies pain    Home Living                      Prior Function            PT Goals (current goals can now be found in the care plan section) Progress towards PT goals: Progressing toward goals    Frequency    Min 3X/week      PT Plan Current plan remains appropriate    Co-evaluation              AM-PAC PT "6 Clicks" Daily Activity  Outcome Measure  Difficulty turning over in bed (including adjusting bedclothes, sheets and blankets)?: A Little Difficulty moving from lying on back to sitting on the side of the bed? : A Little Difficulty sitting down on and standing up from a chair with arms (e.g., wheelchair, bedside commode, etc,.)?: Unable Help needed moving to and from a bed to chair (including  a wheelchair)?: A Little Help needed walking in hospital room?: A Little Help needed climbing 3-5 steps with a railing? : A Lot 6 Click Score: 15    End of Session Equipment Utilized During Treatment: Gait belt Activity Tolerance: Patient tolerated treatment well Patient left: in chair;with call bell/phone within reach;with chair alarm set;with family/visitor present;with nursing/sitter in room Nurse Communication: Mobility status PT Visit Diagnosis: Unsteadiness on feet (R26.81);Other abnormalities of gait and mobility (R26.89);Muscle weakness (generalized) (M62.81)     Time: 5271-2929 PT Time Calculation (min) (ACUTE ONLY): 22 min  Charges:  $Gait Training: 8-22 mins                    G Codes:        [facility] PT [place] 07/24/2017, 10:42 AM

## 2017-07-24 NOTE — Telephone Encounter (Signed)
I called the patient to schedule injections. He did not answer so I left a VM asking him to call me back.

## 2017-07-25 DIAGNOSIS — Z66 Do not resuscitate: Secondary | ICD-10-CM

## 2017-07-25 DIAGNOSIS — Z9189 Other specified personal risk factors, not elsewhere classified: Secondary | ICD-10-CM

## 2017-07-25 DIAGNOSIS — R05 Cough: Secondary | ICD-10-CM

## 2017-07-25 LAB — CULTURE, BLOOD (ROUTINE X 2)
CULTURE: NO GROWTH
Culture: NO GROWTH
Special Requests: ADEQUATE
Special Requests: ADEQUATE

## 2017-07-25 LAB — GLUCOSE, CAPILLARY
GLUCOSE-CAPILLARY: 90 mg/dL (ref 65–99)
Glucose-Capillary: 90 mg/dL (ref 65–99)

## 2017-07-25 MED ORDER — AMOXICILLIN-POT CLAVULANATE 875-125 MG PO TABS: 1.0000 | ORAL_TABLET | Freq: Two times a day (BID) | ORAL | 0 refills | Status: AC

## 2017-07-25 MED ORDER — PREDNISONE 5 MG PO TABS: 5.0000 mg | ORAL_TABLET | Freq: Every day | ORAL | Status: AC

## 2017-07-25 NOTE — Telephone Encounter (Signed)
IT is Ok to put him on schedule, just remind his wife to bring the paper work.

## 2017-07-25 NOTE — Clinical Social Work Placement (Signed)
   CLINICAL SOCIAL WORK PLACEMENT  NOTE  Date:  07/25/2017  Patient Details  Name: Gabriel Stewart MRN: [id number] Date of Birth: 08-18-1921  Clinical Social Work is seeking post-discharge placement for this patient at the [place] level of care (*CSW will initial, date and re-position this form in  chart as items are completed):  Yes   Patient/family provided with [place] Work Department's list of facilities offering this level of care within the geographic area requested by the patient (or if unable, by the patient's family).  Yes   Patient/family informed of their freedom to choose among providers that offer the needed level of care, that participate in Medicare, Medicaid or managed care program needed by the patient, have an available bed and are willing to accept the patient.  Yes   Patient/family informed of [facility]'s ownership interest in [facility] and [facility], as well as of the fact that they are under no obligation to receive care at these facilities.  PASRR submitted to EDS on 07/24/17     PASRR number received on       Existing PASRR number confirmed on 07/24/17     FL2 transmitted to all facilities in geographic area requested by pt/family on 07/24/17     FL2 transmitted to all facilities within larger geographic area on       Patient informed that his/her managed care company has contracts with or will negotiate with certain facilities, including the following:        Yes   Patient/family informed of bed offers received.  Patient chooses bed at [facility]     Physician recommends and patient chooses bed at      Patient to be transferred to [facility] on 07/25/17.  Patient to be transferred to facility by Wife and daughter-in-law will transport     Patient family notified on 07/25/17 of transfer.  Name of family member notified:  [name] and Gardner Candle     PHYSICIAN       Additional Comment:     _______________________________________________ Candie Chroman, LCSW 07/25/2017, 10:33 AM

## 2017-07-25 NOTE — Progress Notes (Signed)
Occupational Therapy Treatment Patient Details Name: Gabriel Stewart MRN: [id number] DOB: 12/16/1921 Today's Date: 07/25/2017    History of present illness Pt adm with aspiration pneumonitis. PMH - Parkinsons, syncope, melanoma, cardiac arrest   OT comments  Pt completed toileting, standing grooming and UB dressing with min to min guard assist. Progressing welll, works hard.   Follow Up Recommendations  SNF    Equipment Recommendations       Recommendations for Other Services      Precautions / Restrictions Precautions Precautions: Fall       Mobility Bed Mobility               General bed mobility comments: in chair  Transfers Overall transfer level: Needs assistance Equipment used: Rolling walker (2 wheeled) Transfers: Sit to/from Stand Sit to Stand: Min guard         General transfer comment: Assist for balance and safety    Balance Overall balance assessment: Needs assistance   Sitting balance-Leahy Scale: Good       Standing balance-Leahy Scale: Fair Standing balance comment: able to release walker in static standing to groom at sink                           ADL either performed or assessed with clinical judgement   ADL Overall ADL's : Needs assistance/impaired     Grooming: Wash/dry hands;Oral care;Standing;Min guard           Upper Body Dressing : Set up;Sitting Upper Body Dressing Details (indicate cue type and reason): front and back opening gowns     Toilet Transfer: Minimal assistance;RW;Ambulation;Comfort height toilet;Grab bars   Toileting- Clothing Manipulation and Hygiene: Min guard;Sit to/from stand       Functional mobility during ADLs: Minimal assistance;Rolling walker(cues for big steps)       Vision       Perception     Praxis      Cognition Arousal/Alertness: Awake/alert Behavior During Therapy: WFL for tasks assessed/performed Overall Cognitive Status: History of cognitive impairments - at  baseline                                          Exercises     Shoulder Instructions       General Comments      Pertinent Vitals/ Pain       Pain Assessment: No/denies pain  Home Living                                          Prior Functioning/Environment              Frequency  Min 2X/week        Progress Toward Goals  OT Goals(current goals can now be found in the care plan section)  Progress towards OT goals: Progressing toward goals  Acute Rehab OT Goals Patient Stated Goal: return home OT Goal Formulation: With patient Time For Goal Achievement: 08/05/17 Potential to Achieve Goals: Good  Plan Discharge plan remains appropriate    Co-evaluation                 AM-PAC PT "6 Clicks" Daily Activity     Outcome Measure   Help from another person eating meals?:  None Help from another person taking care of personal grooming?: A Little Help from another person toileting, which includes using toliet, bedpan, or urinal?: A Little Help from another person bathing (including washing, rinsing, drying)?: A Little Help from another person to put on and taking off regular upper body clothing?: None Help from another person to put on and taking off regular lower body clothing?: A Little 6 Click Score: 20    End of Session Equipment Utilized During Treatment: Gait belt;Rolling walker  OT Visit Diagnosis: Unsteadiness on feet (R26.81)   Activity Tolerance Patient tolerated treatment well   Patient Left in chair;with call bell/phone within reach;with chair alarm set   Nurse Communication          Time: [phone] OT Time Calculation (min): 23 min  Charges: OT General Charges $OT Visit: 1 Visit OT Treatments $Self Care/Home Management : 23-37 mins  07/25/2017 Nestor Lewandowsky, OTR/L Pager: [place], [place] 07/25/2017, 9:22 AM

## 2017-07-25 NOTE — Discharge Summary (Signed)
Physician Discharge Summary  Gabriel Stewart FUX:[id number] DOB: 1921/12/15 DOA: 07/20/2017  PCP: Gabriel Ruff, MD  Admit date: 07/20/2017 Discharge date: 07/25/2017  Admitted From: Home Disposition:  SNF  Recommendations for Outpatient Follow-up:  1. Follow up with PCP in 1-2 weeks 2. Please obtain BMP/CBC in one week 3. Complete per tube augmentin x 2 more days after discharge   Discharge Condition:Stable CODE STATUS:DNR Diet recommendation: Tube feeds as follows: Initiate bolus tube feeding of 2 cans Jevity 1.2 TID via G-tube   Add 30 mL Pro-Stat daily  Tube feeding regimen provides 2230 kcal, 106 grams of protein, and 1080 ml of H2O.   Provide 300 mL water flush q 8 hours Flush tube 30 mL before and after each feeding Total water provided: 2160 mL/d    Brief/Interim Summary: 84 year old married male with PMH of Parkinson's disease, dysphagia with a PEG tube that has not been used in 2 years, on oral diet but not using thickener consistently, HTN, presented with one-week history of cough and dyspnea. He was admitted for aspiration pneumonitis and respiratory distress. Palliative care consulted, transitioned to DO NOT RESUSCITATE, spouse wishes to have tube feeds only and NPO given aspiration risks. So far tolerating tube feeds. Improving. DC to SNF pending bed availability and insurance approval ([employer] following).  Aspiration pneumonitis - Dr. Carles Collet had long discussion with the patient's wife--does not appear ready for comfort feedings/comfort care -Appreciate speech therapy evaluation-->dysphagia 2 diet with nectar thickened liquids >now discontinued based on palliative care input. -The patient will be a high risk for recurrent aspirations and repeat hospitalizations -Influenza PCR negative -Respiratory viral panel-positive for rhinovirus-likely compounding the patient's current respiratory status - Since G-tube is appropriately placed, spouse wants patient to  be completely NPO and wants everything to go through the tube. - Blood cultures 2: Negative to date. Chest x-ray 07/23/17 personally reviewed and no active disease noted. - Transition to Augmentin via tube to complete total 7 days course. - Improved and stable.  Acute viral bronchitis with Bronchospasm - Chest x-ray on admission without pneumonia. - All his chest findings may be related to acute viral bronchitis/RSV panel positive for rhinovirus. - Continue duo nebs -de-escalated IVSolu-Medrol>>>wean prednisone on discharge - Improving and stable.  Parkinson's disease -Continue Sinemet. Stable -Patient followsDr. Penumalli  Moderate protein calorie malnutrition -Continue Ensure -consult nutrition - Had poor po intake, with concerns regarding patient's ability to sustain adequate po intake in the setting of aspiration - Tube feeding started 12/31.  Essential hypertension -wife states that patient no longer takes lisinopril -BP remains stable off lisinopril  Hyperglycemia -due to steroids. Monitor CBG's, controlled.    Discharge Diagnoses:  Principal Problem:   Aspiration into airway Active Problems:   Parkinson's disease ([place])   Hypertension   Cough   At high risk for aspiration   Aspiration pneumonitis (HCC)   Malnutrition of moderate degree   Moderate malnutrition (HCC)   DNR (do not resuscitate)   Palliative care by specialist   Weakness generalized    Discharge Instructions   Allergies as of 07/25/2017      Reactions   Novocain [procaine]    Syncope, "passes out"      Medication List    STOP taking these medications   rasagiline 0.5 MG Tabs tablet Commonly known as:  AZILECT     TAKE these medications   amoxicillin-clavulanate 875-125 MG tablet Commonly known as:  AUGMENTIN Place 1 tablet into feeding tube every 12 (twelve) hours.  carbidopa-levodopa 25-100 MG tablet Commonly known as:  SINEMET IR Take 2 tablets 3 (three) times daily by  mouth.   ENSURE Take 237 mLs by mouth 5 (five) times daily. Or Equate, 350 calories each, 5 cans daily   glycopyrrolate 1 MG tablet Commonly known as:  ROBINUL Take 1 tablet (1 mg total) 3 (three) times daily by mouth.   predniSONE 5 MG tablet Commonly known as:  DELTASONE Place 1 tablet (5 mg total) into feeding tube daily with breakfast.   SUPER B COMPLEX PO Take 1 tablet by mouth daily.   vitamin B-12 1000 MCG tablet Commonly known as:  CYANOCOBALAMIN Take 1,000 mcg by mouth daily.   VITAMIN D PO Take 1 capsule by mouth daily.      Contact information for after-discharge care    Destination    [facility] Follow up.   Service:  Skilled Nursing Contact information: [place] [address] [phone]             Allergies  Allergen Reactions  . Novocain [Procaine]     Syncope, "passes out"    Procedures/Studies: Dg Chest 2 View  Result Date: 07/19/2017 CLINICAL DATA:  Shortness of breath EXAM: CHEST  2 VIEW COMPARISON:  11/20/2016 FINDINGS: No focal pulmonary infiltrate or pleural effusion. Normal cardiomediastinal silhouette with aortic atherosclerosis. No pneumothorax. Mild scarring at the left lung base. Degenerative changes of the spine. IMPRESSION: No active cardiopulmonary disease. Electronically Signed   By: Gabriel Stewart M.D.   On: 07/19/2017 20:51   Dg Abdomen Peg Tube Location  Result Date: 07/21/2017 CLINICAL DATA:  Percutaneous gastrostomy tube placement. EXAM: ABDOMEN - 1 VIEW COMPARISON:  None. FINDINGS: A small amount of contrast injected through the gastrostomy tube is seen within the stomach and proximal duodenum. Oral contrast material also seen within the colon, likely from recent modified barium swallow. No evidence of dilated bowel loops. IMPRESSION: Percutaneous gastrostomy tube within stomach. Unremarkable bowel gas pattern. Electronically Signed   By: Gabriel Stewart M.D.   On: 07/21/2017 20:06   Dg Chest  Port 1 View  Result Date: 07/23/2017 CLINICAL DATA:  Congestion and difficulty breathing EXAM: PORTABLE CHEST 1 VIEW COMPARISON:  07/19/2017 FINDINGS: The heart size and mediastinal contours are within normal limits. Both lungs are clear. The visualized skeletal structures are unremarkable. IMPRESSION: No active disease. Electronically Signed   By: Inez Catalina M.D.   On: 07/23/2017 08:07   Dg Swallowing Func-speech Pathology  Result Date: 07/20/2017 Objective Swallowing Evaluation: Type of Study: MBS-Modified Barium Swallow Study  Patient Details Name: Gabriel Stewart MRN: [id number] Date of Birth: 09/03/1921 Today's Date: 07/20/2017 Time: SLP Start Time (ACUTE ONLY): 17 -SLP Stop Time (ACUTE ONLY): 1250 SLP Time Calculation (min) (ACUTE ONLY): 20 min Past Medical History: Past Medical History: Diagnosis Date . Cardiac arrest [facility]) 1970's  "did CPR & revived him"  . Dizzy spells   "frequent" . Hypertension  . Kidney stone  . Lyme disease [phone] . Melanoma ([place]) 2010  "back" . Parkinson's disease ([place])  . Pneumonia   "as a kid" . Syncope and collapse 11/24/2014 Past Surgical History: Past Surgical History: Procedure Laterality Date . BLADDER STONE REMOVAL  2015 . CATARACT EXTRACTION W/ INTRAOCULAR LENS  IMPLANT, BILATERAL Bilateral 1970 . CIRCUMCISION  2015 . CYSTOSCOPY W/ STONE MANIPULATION    "never got the stone though" . MELANOMA EXCISION  2010  "back" . TEMPORAL ARTERY BIOPSY / LIGATION  [id number]  "before dx'd w/Lyme's disease"  HPI: Patient is a 84 y.o. male with PMH: Parkinson's disease, difficulty swallowing, s/p feeding tube, cardiac arrest, syncope, right bundle blockage, melanoma, who presents to hospital with cough and SOB.  Subjective: pleasant, able to answer questions but with very low vocal intensity Assessment / Plan / Recommendation CHL IP CLINICAL IMPRESSIONS 07/20/2017 Clinical Impression Patient presents with a mod-severe oropharyngeal dysphagia characterized by swallow initiation  delays to vallecular sinus for puree, regular and nectar consistencies, and delays to pyriform sinus with thin liquids. Patient with moderate vallecular residuals post initial swallow with puree solids and regular solids, which started to clear (not fully) with cued swallows and use of throat clear and reswallow, as well as chin tuck and reswallow strategies. Patient exhibited penetration and aspiration during and after swallow with thin liquids and did not sense until after penetration or aspiration had already occured. Chin tuck strategy did not help in reducing incidence of penetration or aspiration, but did help with clearance of vallecular sinus and pyriform sinus residuals.  SLP Visit Diagnosis Dysphagia, oropharyngeal phase (R13.12) Attention and concentration deficit following -- Frontal lobe and executive function deficit following -- Impact on safety and function Moderate aspiration risk   CHL IP TREATMENT RECOMMENDATION 07/20/2017 Treatment Recommendations Therapy as outlined in treatment plan below   Prognosis 07/20/2017 Prognosis for Safe Diet Advancement Fair Barriers to Reach Goals Severity of deficits Barriers/Prognosis Comment -- CHL IP DIET RECOMMENDATION 07/20/2017 SLP Diet Recommendations Dysphagia 2 (Fine chop) solids;Nectar thick liquid Liquid Administration via Cup;No straw Medication Administration Crushed with puree Compensations Minimize environmental distractions;Slow rate;Small sips/bites;Multiple dry swallows after each bite/sip;Clear throat intermittently;Chin tuck Postural Changes Remain semi-upright after after feeds/meals (Comment);Seated upright at 90 degrees   CHL IP OTHER RECOMMENDATIONS 07/20/2017 Recommended Consults -- Oral Care Recommendations Oral care QID Other Recommendations Have oral suction available;Order thickener from pharmacy;Prohibited food (jello, ice cream, thin soups);Remove water pitcher   CHL IP FOLLOW UP RECOMMENDATIONS 07/20/2017 Follow up Recommendations  Skilled Nursing facility;Home health SLP;24 hour supervision/assistance   CHL IP FREQUENCY AND DURATION 07/20/2017 Speech Therapy Frequency (ACUTE ONLY) min 2x/week Treatment Duration 1 week      CHL IP ORAL PHASE 07/20/2017 Oral Phase Impaired Oral - Pudding Teaspoon -- Oral - Pudding Cup -- Oral - Honey Teaspoon -- Oral - Honey Cup -- Oral - Nectar Teaspoon -- Oral - Nectar Cup Weak lingual manipulation Oral - Nectar Straw -- Oral - Thin Teaspoon -- Oral - Thin Cup Premature spillage Oral - Thin Straw -- Oral - Puree Weak lingual manipulation Oral - Mech Soft Impaired mastication;Weak lingual manipulation;Piecemeal swallowing;Other (Comment) Oral - Regular -- Oral - Multi-Consistency -- Oral - Pill -- Oral Phase - Comment --  CHL IP PHARYNGEAL PHASE 07/20/2017 Pharyngeal Phase Impaired Pharyngeal- Pudding Teaspoon -- Pharyngeal -- Pharyngeal- Pudding Cup -- Pharyngeal -- Pharyngeal- Honey Teaspoon -- Pharyngeal -- Pharyngeal- Honey Cup -- Pharyngeal -- Pharyngeal- Nectar Teaspoon -- Pharyngeal -- Pharyngeal- Nectar Cup Delayed swallow initiation-vallecula;Pharyngeal residue - valleculae;Compensatory strategies attempted (with notebox) Pharyngeal -- Pharyngeal- Nectar Straw -- Pharyngeal -- Pharyngeal- Thin Teaspoon Delayed swallow initiation-vallecula;Delayed swallow initiation-pyriform sinuses;Penetration/Aspiration during swallow Pharyngeal Material enters airway, remains ABOVE vocal cords then ejected out Pharyngeal- Thin Cup Delayed swallow initiation-pyriform sinuses;Delayed swallow initiation-vallecula;Penetration/Aspiration during swallow;Reduced airway/laryngeal closure;Compensatory strategies attempted (with notebox) Pharyngeal Material enters airway, passes BELOW cords and not ejected out despite cough attempt by patient;Material enters airway, passes BELOW cords without attempt by patient to eject out (silent aspiration) Pharyngeal- Thin Straw -- Pharyngeal -- Pharyngeal- Puree Pharyngeal residue -  valleculae;Compensatory strategies attempted (with notebox) Pharyngeal -- Pharyngeal- Mechanical Soft Delayed swallow initiation-vallecula;Pharyngeal residue - valleculae Pharyngeal -- Pharyngeal- Regular -- Pharyngeal -- Pharyngeal- Multi-consistency -- Pharyngeal -- Pharyngeal- Pill -- Pharyngeal -- Pharyngeal Comment --  No flowsheet data found. CHL IP GO 05/27/2016 Functional Assessment Tool Used clinical judgment Functional Limitations Swallowing Swallow Current Status [phone]) CI Swallow Goal Status (G9924) CI Swallow Discharge Status (Q6834) CI Motor Speech Current Status [phone] (None) Motor Speech Goal Status (W9798) (None) Motor Speech Goal Status (X2119) (None) Spoken Language Comprehension Current Status (E1740) (None) Spoken Language Comprehension Goal Status (C1448) (None) Spoken Language Comprehension Discharge Status [phone]) (None) Spoken Language Expression Current Status [phone]) (None) Spoken Language Expression Goal Status [phone]) (None) Spoken Language Expression Discharge Status [phone]) (None) Attention Current Status (Y7741) (None) Attention Goal Status (O8786) (None) Attention Discharge Status [phone]) (None) Memory Current Status (N4709) (None) Memory Goal Status (G2836) (None) Memory Discharge Status (O2947) (None) Voice Current Status (M5465) (None) Voice Goal Status (K3546) (None) Voice Discharge Status [phone]) (None) Other Speech-Language Pathology Functional Limitation Current Status [phone]) (None) Other Speech-Language Pathology Functional Limitation Goal Status [phone]) (None) Other Speech-Language Pathology Functional Limitation Discharge Status [phone]) (None) Sonia Baller, MA, CCC-SLP 07/20/17 3:00 PM               Subjective: Eager to be discharged  Discharge Exam: Vitals:   07/25/17 0928 07/25/17 0951  BP: (!) 171/69   Pulse: 68   Resp:    Temp:    SpO2: 99% 98%   Vitals:   07/24/17 2238 07/25/17 0546 07/25/17 0928 07/25/17 0951  BP: 139/72 (!) 155/76 (!) 171/69    Pulse: 72  68   Resp: 18 20    Temp: 98 F (36.7 C) 97.7 F (36.5 C)    TempSrc: Oral Oral    SpO2: 94% 98% 99% 98%  Weight:  74.9 kg (165 lb 3.2 oz)    Height:        General: Pt is alert, awake, not in acute distress Cardiovascular: RRR, S1/S2 +, no rubs, no gallops Respiratory: CTA bilaterally, no wheezing, no rhonchi Abdominal: Soft, NT, ND, bowel sounds + Extremities: no edema, no cyanosis   The results of significant diagnostics from this hospitalization (including imaging, microbiology, ancillary and laboratory) are listed below for reference.     Microbiology: Recent Results (from the past 240 hour(s))  Culture, blood (routine x 2) Call MD if unable to obtain prior to antibiotics being given     Status: None   Collection Time: 07/20/17  2:30 AM  Result Value Ref Range Status   Specimen Description BLOOD RIGHT HAND  Final   Special Requests IN PEDIATRIC BOTTLE Blood Culture adequate volume  Final   Culture NO GROWTH 5 DAYS  Final   Report Status 07/25/2017 FINAL  Final  Culture, blood (routine x 2) Call MD if unable to obtain prior to antibiotics being given     Status: None   Collection Time: 07/20/17  2:40 AM  Result Value Ref Range Status   Specimen Description BLOOD LEFT ANTECUBITAL  Final   Special Requests   Final    BOTTLES DRAWN AEROBIC AND ANAEROBIC Blood Culture adequate volume   Culture NO GROWTH 5 DAYS  Final   Report Status 07/25/2017 FINAL  Final  Respiratory Panel by PCR     Status: Abnormal   Collection Time: 07/20/17  2:48 AM  Result Value Ref Range Status   Adenovirus NOT DETECTED NOT DETECTED Final   Coronavirus  229E NOT DETECTED NOT DETECTED Final   Coronavirus HKU1 NOT DETECTED NOT DETECTED Final   Coronavirus NL63 NOT DETECTED NOT DETECTED Final   Coronavirus OC43 NOT DETECTED NOT DETECTED Final   Metapneumovirus NOT DETECTED NOT DETECTED Final   Rhinovirus / Enterovirus DETECTED (A) NOT DETECTED Final   Influenza A NOT DETECTED NOT  DETECTED Final   Influenza B NOT DETECTED NOT DETECTED Final   Parainfluenza Virus 1 NOT DETECTED NOT DETECTED Final   Parainfluenza Virus 2 NOT DETECTED NOT DETECTED Final   Parainfluenza Virus 3 NOT DETECTED NOT DETECTED Final   Parainfluenza Virus 4 NOT DETECTED NOT DETECTED Final   Respiratory Syncytial Virus NOT DETECTED NOT DETECTED Final   Bordetella pertussis NOT DETECTED NOT DETECTED Final   Chlamydophila pneumoniae NOT DETECTED NOT DETECTED Final   Mycoplasma pneumoniae NOT DETECTED NOT DETECTED Final     Labs: BNP (last 3 results) No results for input(s): BNP in the last 8760 hours. Basic Metabolic Panel: Recent Labs  Lab 07/19/17 2008 07/21/17 0436  NA 139 139  K 4.1 4.3  CL 101 104  CO2 30 27  GLUCOSE 160* 152*  BUN 25* 39*  CREATININE 0.87 0.89  CALCIUM 9.5 8.7*   Liver Function Tests: Recent Labs  Lab 07/19/17 2008  AST 21  ALT 10*  ALKPHOS 105  BILITOT 0.8  PROT 7.4  ALBUMIN 3.5   No results for input(s): LIPASE, AMYLASE in the last 168 hours. No results for input(s): AMMONIA in the last 168 hours. CBC: Recent Labs  Lab 07/19/17 2008 07/21/17 0436 07/22/17 0730  WBC 10.9* 14.4* 13.4*  NEUTROABS 9.4*  --   --   HGB 12.0* 9.4* 10.8*  HCT 38.2* 29.3* 34.1*  MCV 96.7 95.8 96.3  PLT 288 236 248   Cardiac Enzymes: No results for input(s): CKTOTAL, CKMB, CKMBINDEX, TROPONINI in the last 168 hours. BNP: Invalid input(s): POCBNP CBG: Recent Labs  Lab 07/24/17 1143 07/24/17 1650 07/24/17 2125 07/25/17 0032 07/25/17 0752  GLUCAP 175* 133* 91 90 90   D-Dimer No results for input(s): DDIMER in the last 72 hours. Hgb A1c No results for input(s): HGBA1C in the last 72 hours. Lipid Profile No results for input(s): CHOL, HDL, LDLCALC, TRIG, CHOLHDL, LDLDIRECT in the last 72 hours. Thyroid function studies No results for input(s): TSH, T4TOTAL, T3FREE, THYROIDAB in the last 72 hours.  Invalid input(s): FREET3 Anemia work up No results for  input(s): VITAMINB12, FOLATE, FERRITIN, TIBC, IRON, RETICCTPCT in the last 72 hours. Urinalysis    Component Value Date/Time   COLORURINE YELLOW 07/20/2017 0304   APPEARANCEUR HAZY (A) 07/20/2017 0304   LABSPEC 1.017 07/20/2017 0304   PHURINE 7.0 07/20/2017 0304   GLUCOSEU NEGATIVE 07/20/2017 0304   HGBUR NEGATIVE 07/20/2017 0304   BILIRUBINUR NEGATIVE 07/20/2017 0304   KETONESUR NEGATIVE 07/20/2017 0304   PROTEINUR NEGATIVE 07/20/2017 0304   NITRITE NEGATIVE 07/20/2017 0304   LEUKOCYTESUR NEGATIVE 07/20/2017 0304   Sepsis Labs Invalid input(s): PROCALCITONIN,  WBC,  LACTICIDVEN Microbiology Recent Results (from the past 240 hour(s))  Culture, blood (routine x 2) Call MD if unable to obtain prior to antibiotics being given     Status: None   Collection Time: 07/20/17  2:30 AM  Result Value Ref Range Status   Specimen Description BLOOD RIGHT HAND  Final   Special Requests IN PEDIATRIC BOTTLE Blood Culture adequate volume  Final   Culture NO GROWTH 5 DAYS  Final   Report Status 07/25/2017 FINAL  Final  Culture, blood (routine x 2) Call MD if unable to obtain prior to antibiotics being given     Status: None   Collection Time: 07/20/17  2:40 AM  Result Value Ref Range Status   Specimen Description BLOOD LEFT ANTECUBITAL  Final   Special Requests   Final    BOTTLES DRAWN AEROBIC AND ANAEROBIC Blood Culture adequate volume   Culture NO GROWTH 5 DAYS  Final   Report Status 07/25/2017 FINAL  Final  Respiratory Panel by PCR     Status: Abnormal   Collection Time: 07/20/17  2:48 AM  Result Value Ref Range Status   Adenovirus NOT DETECTED NOT DETECTED Final   Coronavirus 229E NOT DETECTED NOT DETECTED Final   Coronavirus HKU1 NOT DETECTED NOT DETECTED Final   Coronavirus NL63 NOT DETECTED NOT DETECTED Final   Coronavirus OC43 NOT DETECTED NOT DETECTED Final   Metapneumovirus NOT DETECTED NOT DETECTED Final   Rhinovirus / Enterovirus DETECTED (A) NOT DETECTED Final   Influenza A NOT  DETECTED NOT DETECTED Final   Influenza B NOT DETECTED NOT DETECTED Final   Parainfluenza Virus 1 NOT DETECTED NOT DETECTED Final   Parainfluenza Virus 2 NOT DETECTED NOT DETECTED Final   Parainfluenza Virus 3 NOT DETECTED NOT DETECTED Final   Parainfluenza Virus 4 NOT DETECTED NOT DETECTED Final   Respiratory Syncytial Virus NOT DETECTED NOT DETECTED Final   Bordetella pertussis NOT DETECTED NOT DETECTED Final   Chlamydophila pneumoniae NOT DETECTED NOT DETECTED Final   Mycoplasma pneumoniae NOT DETECTED NOT DETECTED Final     SIGNED:   Marylu Lund, MD  [facility] 07/25/2017, 10:19 AM  If 7PM-7AM, please contact night-coverage [email or web address] Password [email or web address]

## 2017-07-25 NOTE — Telephone Encounter (Signed)
I called the patients home to schedule his injections, his wife answered and informed me the patient had been hospitalized and she has put him in a nursing home. I could not find a DPR on file for this patient with his wife on it, so I did not go into detail over why I was calling and I did not schedule injections. Does he have a DPR somewhere I am overlooking?

## 2017-07-25 NOTE — Progress Notes (Signed)
Called report to [facility], spoke to Production assistant, radio. Reviewed AVS with pt. Answered his questions. Pt is stable and ready for discharge. Bed will be available between 12:30 and 1. Pt's wife will be taking him to facility.

## 2017-07-25 NOTE — Progress Notes (Signed)
Nutrition Follow-up  DOCUMENTATION CODES:   Non-severe (moderate) malnutrition in context of chronic illness  INTERVENTION:   -Continue bolus tube feeding of 2 cans Jevity 1.2 TID via G-tube   Add 30 mL Pro-Stat daily  Tube feeding regimen provides 2230 kcal, 106 grams of protein, and 1080 ml of H2O.   Provide 300 mL water flush q 8 hours Flush tube 30 mL before and after each feeding Total water provided: 2160 mL/d  NUTRITION DIAGNOSIS:   Moderate Malnutrition related to dysphagia, other (see comment)(advanced age) as evidenced by moderate fat depletion, moderate muscle depletion.  Ongoing  GOAL:   Patient will meet greater than or equal to 90% of their needs  Met with TF  MONITOR:   Diet advancement, Labs, TF tolerance, Weight trends, Skin, I & O's  REASON FOR ASSESSMENT:   Consult Diet education  ASSESSMENT:   84 y.o. male with medical history significant of Parkinson's disease, difficulty swallowing, s/p of feeding tube, cardiac arrest, syncope, right bundle blockage, melanoma, who presents with cough and shortness of breath. Pt found to have aspiration PNA  Pt laying in recliner chair at time of visit. Pt wife not at bedside at time of visit.   Case discussed with SLP, who confirms that pt was cleared for a dysphagia 2 diet with nectar thick liquids on MBSS, but wife was hesitant to start diet due to aspiration risk. Per SLP, pt is on free water protocol. Per RN notes, pt discharging to [facility] today.   Pt receiving  Bolus feedings of 474 ml of Jevity 1.2 TID with 30 ml Prostat daily and 300 ml free water flush every hours (with additional 30 ml free water flush before and after each feeding). Complete TF regimen provides 2230 kcals, 106 grams protein and 2160 ml free water daily, meeting 100% of estimated nutritional needs.   Labs reviewed: CBGS: 90-175.   Diet Order:  Diet NPO time specified  EDUCATION NEEDS:   Education needs have been  addressed  Skin:  Skin Assessment: Reviewed RN Assessment  Last BM:  07/23/17  Height:   Ht Readings from Last 1 Encounters:  07/20/17 6' (1.829 m)    Weight:   Wt Readings from Last 1 Encounters:  07/25/17 165 lb 3.2 oz (74.9 kg)    Ideal Body Weight:  80.9 kg  BMI:  Body mass index is 22.41 kg/m.  Estimated Nutritional Needs:   Kcal:  1850-2150kcal/day   Protein:  111-126g/day   Fluid:  >2L/day    Kaymen Adrian A. Jimmye Norman, RD, LDN, CDE Pager: [phone] After hours Pager: [phone]

## 2017-07-25 NOTE — Clinical Social Work Note (Signed)
CSW facilitated patient discharge including contacting patient family and facility to confirm patient discharge plans. Clinical information faxed to facility and family agreeable with plan. Patient's wife and daughter-in-law will transport patient to [facility] by car. His room will be ready at the facility between 12:30 and 1:00. RN to call report prior to discharge [phone]).  CSW will sign off for now as social work intervention is no longer needed. Please consult [place] again if new needs arise.  Dayton Scrape, [place]

## 2017-07-25 NOTE — Telephone Encounter (Signed)
Wife is HC POA, papers not found in patient's Epic chart. She has told this RN she would bring in next time patient was seen. Will ask Dr Leta Baptist , Dr Krista Blue if it is prudent to go forward with trying to schedule Botox for patient.

## 2017-07-25 NOTE — Progress Notes (Signed)
Physical Therapy Treatment Patient Details Name: Gabriel Stewart MRN: [id number] DOB: October 09, 1921 Today's Date: 07/25/2017    History of Present Illness Pt adm with aspiration pneumonitis. PMH - Parkinsons, syncope, melanoma, cardiac arrest    PT Comments    Patient received up in chair, pleasant and willing to work with skilled PT services this morning prior to DC to SNF. He continues to require min guard for functional transfers as well as Min assist during gait due to general unsteadiness and festinating pattern, also for safety with assistive device. Patient limited in progressing gait distance due to fatigue. He was left up in chair with family present and RN attending, chair alarm activated, all needs otherwise met.     Follow Up Recommendations  SNF     Equipment Recommendations  None recommended by PT    Recommendations for Other Services       Precautions / Restrictions Precautions Precautions: Fall Restrictions Weight Bearing Restrictions: No    Mobility  Bed Mobility               General bed mobility comments: DNT, received up in chair   Transfers Overall transfer level: Needs assistance Equipment used: Rolling walker (2 wheeled) Transfers: Sit to/from Stand Sit to Stand: Min guard         General transfer comment: assist for balance and safety, cues for fluidity of motion   Ambulation/Gait Ambulation/Gait assistance: Min assist Ambulation Distance (Feet): 160 Feet Assistive device: Rolling walker (2 wheeled) Gait Pattern/deviations: Step-to pattern;Decreased step length - right;Decreased step length - left;Shuffle;Festinating     General Gait Details: min guard for safety due to generalized unsteadiness; able to correct gait mechanics and step lengths with VC, but frequently returns to festination and shuffling when VC not given. Difficulty with turns. Min assist for safety with assistive device. Gait distance limited by fatigue    Stairs             Wheelchair Mobility    Modified Rankin (Stroke Patients Only)       Balance Overall balance assessment: Needs assistance Sitting-balance support: No upper extremity supported;Feet supported Sitting balance-Leahy Scale: Good     Standing balance support: Bilateral upper extremity supported Standing balance-Leahy Scale: Fair Standing balance comment: able to release walker in static standing to groom at sink                            Cognition Arousal/Alertness: Awake/alert Behavior During Therapy: WFL for tasks assessed/performed Overall Cognitive Status: History of cognitive impairments - at baseline                                        Exercises      General Comments        Pertinent Vitals/Pain Pain Assessment: Faces Faces Pain Scale: No hurt Pain Intervention(s): Limited activity within patient's tolerance;Monitored during session    Home Living                      Prior Function            PT Goals (current goals can now be found in the care plan section) Acute Rehab PT Goals Patient Stated Goal: return home PT Goal Formulation: With patient Time For Goal Achievement: 08/05/17 Potential to Achieve Goals: Good Progress towards PT goals: Progressing toward  goals    Frequency    Min 3X/week      PT Plan Current plan remains appropriate    Co-evaluation              AM-PAC PT "6 Clicks" Daily Activity  Outcome Measure  Difficulty turning over in bed (including adjusting bedclothes, sheets and blankets)?: Unable Difficulty moving from lying on back to sitting on the side of the bed? : Unable Difficulty sitting down on and standing up from a chair with arms (e.g., wheelchair, bedside commode, etc,.)?: Unable Help needed moving to and from a bed to chair (including a wheelchair)?: A Little Help needed walking in hospital room?: A Little Help needed climbing 3-5 steps with a railing? : A  Lot 6 Click Score: 11    End of Session Equipment Utilized During Treatment: Gait belt Activity Tolerance: Patient tolerated treatment well Patient left: in chair;with call bell/phone within reach;with chair alarm set;with nursing/sitter in room;with family/visitor present   PT Visit Diagnosis: Unsteadiness on feet (R26.81);Other abnormalities of gait and mobility (R26.89);Muscle weakness (generalized) (M62.81)     Time: 5749-3552 PT Time Calculation (min) (ACUTE ONLY): 15 min  Charges:                       G Codes:       Deniece Ree PT, DPT, CBIS  Supplemental Physical Therapist [place]

## 2017-07-27 LAB — EXPECTORATED SPUTUM ASSESSMENT W GRAM STAIN, RFLX TO RESP C

## 2017-07-27 LAB — EXPECTORATED SPUTUM ASSESSMENT W REFEX TO RESP CULTURE

## 2017-07-28 ENCOUNTER — Emergency Department (HOSPITAL_COMMUNITY): Payer: Medicare PPO

## 2017-07-28 ENCOUNTER — Inpatient Hospital Stay (HOSPITAL_COMMUNITY)
Admission: EM | Admit: 2017-07-28 | Discharge: 2017-08-23 | DRG: 146 | Disposition: E | Payer: Medicare PPO | Attending: Internal Medicine | Admitting: Internal Medicine

## 2017-07-28 ENCOUNTER — Encounter (HOSPITAL_COMMUNITY): Payer: Self-pay

## 2017-07-28 DIAGNOSIS — G20A1 Parkinson's disease without dyskinesia, without mention of fluctuations: Secondary | ICD-10-CM | POA: Diagnosis present

## 2017-07-28 DIAGNOSIS — A419 Sepsis, unspecified organism: Secondary | ICD-10-CM | POA: Diagnosis present

## 2017-07-28 DIAGNOSIS — E440 Moderate protein-calorie malnutrition: Secondary | ICD-10-CM | POA: Diagnosis present

## 2017-07-28 DIAGNOSIS — J387 Other diseases of larynx: Secondary | ICD-10-CM | POA: Diagnosis not present

## 2017-07-28 DIAGNOSIS — Z7952 Long term (current) use of systemic steroids: Secondary | ICD-10-CM

## 2017-07-28 DIAGNOSIS — Z884 Allergy status to anesthetic agent status: Secondary | ICD-10-CM | POA: Diagnosis not present

## 2017-07-28 DIAGNOSIS — I959 Hypotension, unspecified: Secondary | ICD-10-CM | POA: Diagnosis present

## 2017-07-28 DIAGNOSIS — J9621 Acute and chronic respiratory failure with hypoxia: Secondary | ICD-10-CM

## 2017-07-28 DIAGNOSIS — Z7189 Other specified counseling: Secondary | ICD-10-CM

## 2017-07-28 DIAGNOSIS — J441 Chronic obstructive pulmonary disease with (acute) exacerbation: Secondary | ICD-10-CM | POA: Diagnosis present

## 2017-07-28 DIAGNOSIS — J690 Pneumonitis due to inhalation of food and vomit: Secondary | ICD-10-CM | POA: Diagnosis present

## 2017-07-28 DIAGNOSIS — R0603 Acute respiratory distress: Secondary | ICD-10-CM | POA: Diagnosis present

## 2017-07-28 DIAGNOSIS — G20 Parkinson's disease: Secondary | ICD-10-CM | POA: Diagnosis present

## 2017-07-28 DIAGNOSIS — D638 Anemia in other chronic diseases classified elsewhere: Secondary | ICD-10-CM | POA: Diagnosis not present

## 2017-07-28 DIAGNOSIS — Z931 Gastrostomy status: Secondary | ICD-10-CM

## 2017-07-28 DIAGNOSIS — R1319 Other dysphagia: Secondary | ICD-10-CM | POA: Diagnosis present

## 2017-07-28 DIAGNOSIS — D630 Anemia in neoplastic disease: Secondary | ICD-10-CM | POA: Diagnosis present

## 2017-07-28 DIAGNOSIS — C328 Malignant neoplasm of overlapping sites of larynx: Principal | ICD-10-CM | POA: Diagnosis present

## 2017-07-28 DIAGNOSIS — Z66 Do not resuscitate: Secondary | ICD-10-CM | POA: Diagnosis present

## 2017-07-28 DIAGNOSIS — J189 Pneumonia, unspecified organism: Secondary | ICD-10-CM

## 2017-07-28 DIAGNOSIS — Z79899 Other long term (current) drug therapy: Secondary | ICD-10-CM | POA: Diagnosis not present

## 2017-07-28 DIAGNOSIS — J9601 Acute respiratory failure with hypoxia: Secondary | ICD-10-CM | POA: Diagnosis present

## 2017-07-28 DIAGNOSIS — Z85820 Personal history of malignant melanoma of skin: Secondary | ICD-10-CM | POA: Diagnosis not present

## 2017-07-28 DIAGNOSIS — Z6823 Body mass index (BMI) 23.0-23.9, adult: Secondary | ICD-10-CM | POA: Diagnosis not present

## 2017-07-28 DIAGNOSIS — Z8701 Personal history of pneumonia (recurrent): Secondary | ICD-10-CM | POA: Diagnosis not present

## 2017-07-28 DIAGNOSIS — Z515 Encounter for palliative care: Secondary | ICD-10-CM | POA: Diagnosis not present

## 2017-07-28 DIAGNOSIS — Z87891 Personal history of nicotine dependence: Secondary | ICD-10-CM

## 2017-07-28 DIAGNOSIS — Z8674 Personal history of sudden cardiac arrest: Secondary | ICD-10-CM | POA: Diagnosis not present

## 2017-07-28 DIAGNOSIS — I10 Essential (primary) hypertension: Secondary | ICD-10-CM | POA: Diagnosis present

## 2017-07-28 LAB — CBC WITH DIFFERENTIAL/PLATELET
BASOS ABS: 0 10*3/uL (ref 0.0–0.1)
BASOS PCT: 0 %
Eosinophils Absolute: 0 10*3/uL (ref 0.0–0.7)
Eosinophils Relative: 0 %
HEMATOCRIT: 30.6 % — AB (ref 39.0–52.0)
HEMOGLOBIN: 9.7 g/dL — AB (ref 13.0–17.0)
LYMPHS PCT: 5 %
Lymphs Abs: 0.7 10*3/uL (ref 0.7–4.0)
MCH: 29.8 pg (ref 26.0–34.0)
MCHC: 31.7 g/dL (ref 30.0–36.0)
MCV: 94.2 fL (ref 78.0–100.0)
Monocytes Absolute: 1 10*3/uL (ref 0.1–1.0)
Monocytes Relative: 7 %
NEUTROS ABS: 12.7 10*3/uL — AB (ref 1.7–7.7)
NEUTROS PCT: 88 %
Platelets: 323 10*3/uL (ref 150–400)
RBC: 3.25 MIL/uL — ABNORMAL LOW (ref 4.22–5.81)
RDW: 13.6 % (ref 11.5–15.5)
WBC: 14.3 10*3/uL — ABNORMAL HIGH (ref 4.0–10.5)

## 2017-07-28 LAB — C-REACTIVE PROTEIN: CRP: 1.5 mg/dL — AB (ref <1.0)

## 2017-07-28 LAB — BASIC METABOLIC PANEL
ANION GAP: 10 (ref 5–15)
BUN: 20 mg/dL (ref 6–20)
CO2: 27 mmol/L (ref 22–32)
Calcium: 8.7 mg/dL — ABNORMAL LOW (ref 8.9–10.3)
Chloride: 98 mmol/L — ABNORMAL LOW (ref 101–111)
Creatinine, Ser: 0.87 mg/dL (ref 0.61–1.24)
Glucose, Bld: 176 mg/dL — ABNORMAL HIGH (ref 65–99)
POTASSIUM: 3.9 mmol/L (ref 3.5–5.1)
Sodium: 135 mmol/L (ref 135–145)

## 2017-07-28 LAB — I-STAT CG4 LACTIC ACID, ED
LACTIC ACID, VENOUS: 3.13 mmol/L — AB (ref 0.5–1.9)
Lactic Acid, Venous: 2.54 mmol/L (ref 0.5–1.9)

## 2017-07-28 LAB — SEDIMENTATION RATE: SED RATE: 26 mm/h — AB (ref 0–16)

## 2017-07-28 LAB — PROCALCITONIN: Procalcitonin: <0.1 ng/mL

## 2017-07-28 LAB — POC OCCULT BLOOD, ED: FECAL OCCULT BLD: NEGATIVE

## 2017-07-28 MED ORDER — FREE WATER
300.0000 mL | Freq: Three times a day (TID) | Status: DC
  Administered 2017-07-28 – 2017-07-29 (×3): 300 mL

## 2017-07-28 MED ORDER — METHYLPREDNISOLONE SODIUM SUCC 125 MG IJ SOLR
60.0000 mg | Freq: Two times a day (BID) | INTRAMUSCULAR | Status: DC
  Administered 2017-07-28 – 2017-07-30 (×4): 60 mg via INTRAVENOUS
  Filled 2017-07-28 (×4): qty 2

## 2017-07-28 MED ORDER — JEVITY 1.2 CAL PO LIQD
237.0000 mL | Freq: Two times a day (BID) | ORAL | Status: DC
  Administered 2017-07-28 – 2017-07-29 (×2): 237 mL
  Filled 2017-07-28 (×5): qty 237

## 2017-07-28 MED ORDER — ALBUTEROL SULFATE (2.5 MG/3ML) 0.083% IN NEBU
5.0000 mg | INHALATION_SOLUTION | Freq: Once | RESPIRATORY_TRACT | Status: AC
  Administered 2017-07-28: 5 mg via RESPIRATORY_TRACT

## 2017-07-28 MED ORDER — LORAZEPAM 2 MG/ML IJ SOLN
0.5000 mg | Freq: Once | INTRAMUSCULAR | Status: AC
  Administered 2017-07-29: 0.5 mg via INTRAVENOUS
  Filled 2017-07-28: qty 1

## 2017-07-28 MED ORDER — SODIUM CHLORIDE 0.9 % IV BOLUS (SEPSIS)
1000.0000 mL | Freq: Once | INTRAVENOUS | Status: AC
  Administered 2017-07-28: 1000 mL via INTRAVENOUS

## 2017-07-28 MED ORDER — CARBIDOPA-LEVODOPA 25-100 MG PO TABS
2.0000 | ORAL_TABLET | Freq: Three times a day (TID) | ORAL | Status: DC
  Administered 2017-07-28 – 2017-08-02 (×14): 2
  Filled 2017-07-28 (×14): qty 2

## 2017-07-28 MED ORDER — GLYCOPYRROLATE 1 MG PO TABS
1.0000 mg | ORAL_TABLET | Freq: Every day | ORAL | Status: DC
  Administered 2017-07-28 – 2017-08-01 (×5): 1 mg
  Filled 2017-07-28 (×5): qty 1

## 2017-07-28 MED ORDER — VANCOMYCIN HCL IN DEXTROSE 1-5 GM/200ML-% IV SOLN
1000.0000 mg | Freq: Once | INTRAVENOUS | Status: AC
  Administered 2017-07-28: 1000 mg via INTRAVENOUS
  Filled 2017-07-28: qty 200

## 2017-07-28 MED ORDER — VANCOMYCIN HCL IN DEXTROSE 750-5 MG/150ML-% IV SOLN
750.0000 mg | Freq: Two times a day (BID) | INTRAVENOUS | Status: DC
  Administered 2017-07-29 – 2017-07-30 (×4): 750 mg via INTRAVENOUS
  Filled 2017-07-28 (×4): qty 150

## 2017-07-28 MED ORDER — PIPERACILLIN-TAZOBACTAM 3.375 G IVPB 30 MIN
3.3750 g | Freq: Once | INTRAVENOUS | Status: AC
  Administered 2017-07-28: 3.375 g via INTRAVENOUS
  Filled 2017-07-28: qty 50

## 2017-07-28 MED ORDER — IPRATROPIUM-ALBUTEROL 0.5-2.5 (3) MG/3ML IN SOLN
3.0000 mL | Freq: Four times a day (QID) | RESPIRATORY_TRACT | Status: DC
Start: 2017-07-28 — End: 2017-07-30
  Administered 2017-07-28 – 2017-07-30 (×9): 3 mL via RESPIRATORY_TRACT
  Filled 2017-07-28 (×10): qty 3

## 2017-07-28 MED ORDER — ENOXAPARIN SODIUM 40 MG/0.4ML ~~LOC~~ SOLN
40.0000 mg | SUBCUTANEOUS | Status: DC
  Administered 2017-07-28 – 2017-07-31 (×4): 40 mg via SUBCUTANEOUS
  Filled 2017-07-28 (×4): qty 0.4

## 2017-07-28 MED ORDER — SODIUM CHLORIDE 0.9 % IV BOLUS (SEPSIS)
500.0000 mL | Freq: Once | INTRAVENOUS | Status: AC
  Administered 2017-07-28: 500 mL via INTRAVENOUS

## 2017-07-28 MED ORDER — DEXTROSE 5 % IV SOLN
1.0000 g | Freq: Three times a day (TID) | INTRAVENOUS | Status: DC
  Administered 2017-07-28 – 2017-07-30 (×6): 1 g via INTRAVENOUS
  Filled 2017-07-28 (×7): qty 1

## 2017-07-28 MED ORDER — ALBUTEROL SULFATE (2.5 MG/3ML) 0.083% IN NEBU
INHALATION_SOLUTION | RESPIRATORY_TRACT | Status: AC
  Administered 2017-07-28: 5 mg via RESPIRATORY_TRACT
  Filled 2017-07-28: qty 6

## 2017-07-28 MED ORDER — SODIUM CHLORIDE 0.9 % IV SOLN
INTRAVENOUS | Status: DC
  Administered 2017-07-28 – 2017-07-31 (×5): via INTRAVENOUS

## 2017-07-28 NOTE — Progress Notes (Signed)
Pharmacy Antibiotic Note  Gabriel Stewart is a 84 y.o. male admitted on 08/13/2017 with pneumonia.  Pharmacy has been consulted for vancomycin dosing.  Plan: Continue cefepime 1g IV Q8h Continue vancomycin 750mg  IV Q12h  Monitor clinical picture, renal function, VT prn F/U C&S, abx deescalation / LOT  No data recorded.  Recent Labs  Lab 07/22/17 0730 07/27/2017 1148 07/25/2017 1158  WBC 13.4* 14.3*  --   CREATININE  --  0.87  --   LATICACIDVEN  --   --  2.54*    Estimated Creatinine Clearance: 56.2 mL/min (by C-G formula based on SCr of 0.87 mg/dL).    Allergies  Allergen Reactions  . Novocain [Procaine]     Syncope, "passes out"    Thank you for allowing pharmacy to be a part of this patient's care.  Elenor Quinones, PharmD, BCPS Clinical Pharmacist Pager [phone] 08/12/2017 1:36 PM

## 2017-07-28 NOTE — ED Notes (Signed)
Hospitalist discussing pt care with pt's wife in consultation room. DNR band placed on pt wrist

## 2017-07-28 NOTE — ED Notes (Signed)
Warming blanket removed 

## 2017-07-28 NOTE — ED Provider Notes (Signed)
[facility] EMERGENCY DEPARTMENT Provider Note   CSN: [id number] Arrival date & time: 07/30/2017  1040     History   Chief Complaint No chief complaint on file.   HPI Gabriel Stewart is a 84 y.o. male.  Patient is a 84 year old male with a history of Parkinson's, hypertension and prior syncope who was recently admitted for aspiration pneumonia.  He presents today with respiratory distress.  He is currently been residing since his recent discharge at [facility] facility.  They note this morning that he was markedly short of breath.  EMS noted a room air saturation in the 70s.  He improved with nebulizer treatments.  He was given 2 nebulizer treatments as well as magnesium and Solu-Medrol in route.  His oxygen saturations have improved but he has been less responsive.  He does a DNR order.  History is otherwise limited due to his mental status change.  Per report, normally he is alert and communicative.      Past Medical History:  Diagnosis Date  . Cardiac arrest [facility]) 1970's   "did CPR & revived him"   . Dizzy spells    "frequent"  . Hypertension   . Kidney stone   . Lyme disease [phone]  . Melanoma ([place]) 2010   "back"  . Parkinson's disease ([place])   . Pneumonia    "as a kid"  . Syncope and collapse 11/24/2014    Patient Active Problem List   Diagnosis Date Noted  . COPD exacerbation ([place]) 08/15/2017  . DNR (do not resuscitate)   . Palliative care by specialist   . Weakness generalized   . Malnutrition of moderate degree 07/21/2017  . Moderate malnutrition ([place]) 07/21/2017  . Cough 07/20/2017  . Aspiration into airway 07/20/2017  . Aspiration pneumonitis ([place]) 07/20/2017  . Shortness of breath   . At high risk for aspiration   . Chest pain 05/26/2016  . Parkinson disease ([place]) 07/19/2015  . Sialorrhea 07/19/2015  . Subdural hematoma S/P Craniotomy and drainage 05/17/2015  . Dysphagia 05/17/2015  . Hypertension 05/17/2015  . BPH  (benign prostatic hyperplasia) 05/17/2015  . Syncope 11/24/2014  . RBBB 11/24/2014  . Orthostatic hypotension 11/24/2014  . Dehydration 11/24/2014  . Unintentional weight loss 11/24/2014  . Syncope and collapse 11/24/2014  . Parkinson's disease ([place]) 11/20/2013  . Essential tremor 11/20/2013  . Gait difficulty 11/20/2013  . Swallowing difficulty 11/20/2013  . Hoarse voice quality 11/20/2013    Past Surgical History:  Procedure Laterality Date  . BLADDER STONE REMOVAL  2015  . CATARACT EXTRACTION W/ INTRAOCULAR LENS  IMPLANT, BILATERAL Bilateral 1970  . CIRCUMCISION  2015  . CYSTOSCOPY W/ STONE MANIPULATION     "never got the stone though"  . MELANOMA EXCISION  2010   "back"  . TEMPORAL ARTERY BIOPSY / LIGATION  3825-0539'J   "before dx'd w/Lyme's disease"       Home Medications    Prior to Admission medications   Medication Sig Start Date End Date Taking? Authorizing Provider  Amino Acids-Protein Hydrolys (FEEDING SUPPLEMENT, PRO-STAT SUGAR FREE 64,) LIQD Place 30 mLs into feeding tube daily.   Yes [provider]  amoxicillin-clavulanate (AUGMENTIN) 875-125 MG tablet Place 1 tablet into feeding tube every 12 (twelve) hours. 07/25/17  Yes Donne Hazel, MD  B Complex-C (SUPER B COMPLEX PO) Place 1 tablet into feeding tube daily.    Yes [provider]  ENSURE (ENSURE) Take 237 mLs by mouth 5 (five) times daily.  Yes [provider]  ergocalciferol (VITAMIN D2) 50000 units capsule Take 50,000 Units by mouth once a week.   Yes [provider]  glycopyrrolate (ROBINUL) 1 MG tablet Take 1 tablet (1 mg total) 3 (three) times daily by mouth. Patient taking differently: Place 1 mg into feeding tube at bedtime.  06/05/17  Yes Penumalli, Earlean Polka, MD  predniSONE (DELTASONE) 5 MG tablet Place 1 tablet (5 mg total) into feeding tube daily with breakfast. 07/25/17  Yes Donne Hazel, MD  carbidopa-levodopa (SINEMET IR) 25-100 MG tablet Take 2  tablets 3 (three) times daily by mouth. Patient taking differently: Place 2 tablets into feeding tube 3 (three) times daily.  06/05/17   Penumalli, Earlean Polka, MD  vitamin B-12 (CYANOCOBALAMIN) 1000 MCG tablet Take 1,000 mcg by mouth daily.     [provider]    Family History Family History  Problem Relation Age of Onset  . Appendicitis Brother   . Pneumonia Brother     Social History Social History   Tobacco Use  . Smoking status: Former Smoker    Packs/day: 1.00    Years: 5.00    Pack years: 5.00    Types: Cigarettes  . Smokeless tobacco: Never Used  . Tobacco comment: quit in 1960  Substance Use Topics  . Alcohol use: Yes    Alcohol/week: 4.2 oz    Types: 7 Shots of liquor per week    Comment: 1 shots bourbon daily  . Drug use: No     Allergies   Novocain [procaine]   Review of Systems Review of Systems  Unable to perform ROS: Mental status change     Physical Exam Updated Vital Signs BP 92/70   Pulse 82   Resp 18   SpO2 98%   Physical Exam  Constitutional: He appears well-developed and well-nourished. He appears distressed.  HENT:  Head: Normocephalic and atraumatic.  Eyes: Pupils are equal, round, and reactive to light.  Neck: Normal range of motion. Neck supple.  Cardiovascular: Regular rhythm and normal heart sounds. Tachycardia present.  Pulmonary/Chest: Accessory muscle usage present. Tachypnea noted. He is in respiratory distress. He has decreased breath sounds. He has no wheezes. He exhibits no tenderness.  Abdominal: Soft. Bowel sounds are normal. There is no tenderness. There is no rebound and no guarding.  Musculoskeletal: Normal range of motion. He exhibits no edema.  Lymphadenopathy:    He has no cervical adenopathy.  Neurological:  Patient is responsive only to painful stimuli  Skin: Skin is warm. No rash noted.  Diaphoretic  Psychiatric: He has a normal mood and affect.     ED Treatments / Results  Labs (all labs  ordered are listed, but only abnormal results are displayed) Labs Reviewed  BASIC METABOLIC PANEL - Abnormal; Notable for the following components:      Result Value   Chloride 98 (*)    Glucose, Bld 176 (*)    Calcium 8.7 (*)    All other components within normal limits  CBC WITH DIFFERENTIAL/PLATELET - Abnormal; Notable for the following components:   WBC 14.3 (*)    RBC 3.25 (*)    Hemoglobin 9.7 (*)    HCT 30.6 (*)    Neutro Abs 12.7 (*)    All other components within normal limits  I-STAT CG4 LACTIC ACID, ED - Abnormal; Notable for the following components:   Lactic Acid, Venous 2.54 (*)    All other components within normal limits  CULTURE, BLOOD (ROUTINE X  2)  CULTURE, BLOOD (ROUTINE X 2)  URINALYSIS, ROUTINE W REFLEX MICROSCOPIC  I-STAT CG4 LACTIC ACID, ED  POC OCCULT BLOOD, ED    EKG  EKG Interpretation  Date/Time:  Sunday July 28 2017 10:45:50 EST Ventricular Rate:  103 PR Interval:    QRS Duration: 157 QT Interval:  364 QTC Calculation: 477 R Axis:   23 Text Interpretation:  Sinus tachycardia Right bundle branch block Probable lateral infarct, old since last tracing no significant change Confirmed by Malvin Johns [phone]) on 08/13/2017 11:12:23 AM       Radiology Dg Chest Port 1 View  Result Date: 08/06/2017 CLINICAL DATA:  Shortness of breath, history hypertension, Parkinson's, melanoma, former smoker EXAM: PORTABLE CHEST 1 VIEW COMPARISON:  Portable exam 1048 hours compared to 07/23/2017 FINDINGS: Normal heart size, mediastinal contours, and pulmonary vascularity. Atherosclerotic calcifications aorta. Emphysematous and minimal bronchitic changes question COPD. Minimal chronic accentuation of LEFT basilar markings. No acute infiltrate, pleural effusion or pneumothorax. Diffuse osseous demineralization. IMPRESSION: Question COPD changes without acute infiltrate. Electronically Signed   By: Lavonia Dana M.D.   On: 08/05/2017 11:13    Procedures Procedures  (including critical care time)  Medications Ordered in ED Medications  vancomycin (VANCOCIN) IVPB 1000 mg/200 mL premix (1,000 mg Intravenous New Bag/Given 08/16/2017 1242)  feeding supplement (JEVITY 1.2 CAL) liquid 237 mL (not administered)  free water 300 mL (not administered)  albuterol (PROVENTIL) (2.5 MG/3ML) 0.083% nebulizer solution 5 mg (5 mg Nebulization Given 08/05/2017 1059)  sodium chloride 0.9 % bolus 1,000 mL (0 mLs Intravenous Stopped 08/17/2017 1202)    And  sodium chloride 0.9 % bolus 1,000 mL (1,000 mLs Intravenous New Bag/Given 08/14/2017 1211)    And  sodium chloride 0.9 % bolus 500 mL (500 mLs Intravenous New Bag/Given 08/03/2017 1211)  piperacillin-tazobactam (ZOSYN) IVPB 3.375 g (0 g Intravenous Stopped 08/04/2017 1230)     Initial Impression / Assessment and Plan / ED Course  I have reviewed the triage vital signs and the nursing notes.  Pertinent labs & imaging results that were available during my care of the patient were reviewed by me and considered in my medical decision making (see chart for details).     Patient is a 84 year old male who presents in respiratory distress.  He was diaphoretic, largely unresponsive and short of breath on arrival.  He has an active DNR in place.  His DNR paperwork was not signed however I did review the hospital records and it was documented that the physicians had a long discussion with the patient's wife on this recent hospitalization with discharge 2 days ago and it was decided the patient would be DNR.  This was honored and patient was placed on BiPAP however.  He was markedly hypotensive as well.  He was started on septic protocol given IV fluids and antibiotics.  He had a big improvement while in the emergency department.  He is now alert and following commands.  He is not diaphoretic.  His color has improved.  His blood pressure has improved.  He will be admitted to the hospitalist service.  CRITICAL CARE Performed by: Malvin Johns Total  critical care time: 60 minutes Critical care time was exclusive of separately billable procedures and treating other patients. Critical care was necessary to treat or prevent imminent or life-threatening deterioration. Critical care was time spent personally by me on the following activities: development of treatment plan with patient and/or surrogate as well as nursing, discussions with consultants, evaluation of patient's response  to treatment, examination of patient, obtaining history from patient or surrogate, ordering and performing treatments and interventions, ordering and review of laboratory studies, ordering and review of radiographic studies, pulse oximetry and re-evaluation of patient's condition.   Final Clinical Impressions(s) / ED Diagnoses   Final diagnoses:  Respiratory distress  Sepsis, due to unspecified organism [facility])    ED Discharge Orders    None       Malvin Johns, MD 08/06/2017 1306

## 2017-07-28 NOTE — ED Notes (Signed)
I Stat Lactic Acid results shown to Dr. Tamera Punt

## 2017-07-28 NOTE — ED Triage Notes (Signed)
Patient arrived by [facility] for acute respiratory distress. Arrived from [facility] with severe dyspnea. Received 3 duonebs pta with mag and 125 solumedrol. Belfi MD at bedside. Patient will open eyes to command and squeeze this RN hand on command. Denies pain by shaking head. Diaphoresis drying up and patient placed on Bi-pap immediately on arrival

## 2017-07-28 NOTE — ED Notes (Signed)
Notified MD of pt lactic acid results and pt BP. 1 Liter bolus of NS hung per MD order. Attempted to contact pt wife, left a VM. MD requests we let her know when wife arrives.

## 2017-07-28 NOTE — ED Notes (Signed)
pts wife jshawn hurta wanted to leave contact number in case she needs to be reached, [phone]

## 2017-07-28 NOTE — H&P (Signed)
History and Physical    Gabriel Stewart:[id number] DOB: [birth year] DOA: 08/04/2017   PCP: Leighton Ruff, MD   Attending physician: Jamse Arn  Patient coming from/Resides with: SNF  Chief Complaint: Acute respiratory failure with hypoxemia  HPI: Gabriel Stewart is a 84 y.o. male with medical history significant for end-stage Parkinson's disease with associated dysphagia, anemia of chronic disease and COPD.  The patient was just discharged on 1/3 after an admission for operatory distress context of aspiration pneumonitis and bronchitis.  Was hospitalized for 6 days and due to deconditioning and recommendations from PT/OT discharge to SNF for rehabilitative care.  She presented with a PEG tube in place that has not been used for 2 years.  Prior to that admission he had been on an oral diet and was using thickened liquids but the wife reported increased issues of coughing and choking with eating prior to admission.  Formal palliative evaluation was completed during the admission and all of the patient was made a DO NOT RESUSCITATE it was documented that the wife was not ready for comfort feedings and comfort care.  Patient was sent back to this facility via EMS for altered mental status and increased work of breathing.  His room air saturations were 70%.  Upon arrival he was placed on BiPAP with improvement in work of breathing and oxygenation.  Chest x-ray without infiltrate but did have findings consistent with COPD.  He did have leukocytosis in context of prednisone therapy prior to admission.  EMS gave patient DuoNeb and high-dose IV Solu-Medrol prior to arrival.  Was also hypotensive at presentation with a BP of 78/38 and he was borderline tachycardic with a heart rate of 97.  He has been given fluid challenges and broad-spectrum empiric antibiotics to cover for HCAP and current vital signs are BP 121/68, pulse 82 and respirations 19 on BiPAP with 40% FiO2.  His most recent rectal  temperature that was somewhat low at 96.8 and could be related to recent fluid boluses.  Wife is at bedside.  She confirmed DNR status.  She states she has not completed a MOST form.  I did discuss with her that I was very concerned of rapid return of respiratory symptoms so close to most recent discharge and that this is a very poor prognostic indicator.  Patient will be admitted to the stepdown unit for acute respiratory failure likely related to recurrent aspiration pneumonia precipitating a COPD exacerbation.  ED Course:  Vital Signs: BP 121/68   Pulse 77   Resp 19   SpO2 100%  PCXR: COPD without infiltrate Lab data: Sodium 135, potassium 3.9, chloride 98, CO2 27, glucose 176, BUN 20, creatinine 0.7, anion gap 10, lactic acid 2.54, white count 14,300 with neutrophils 88%, absolute neutrophils 12.7%, hemoglobin 9.7, platelets 323,000, blood cultures obtained in the ER Medications and treatments: Albuterol neb x1, normal saline fluid challenges times 2500 cc, vancomycin 1 g IV x1, Zosyn 3.375 g IV x1  Review of Systems:  **Patient unable to provide history, wife unsure on history although she does report one episode of patient experiencing incontinence of stool that was not diarrheal after arrival to the nursing facility.  Over the past 6-12 months he has lost 20 pounds unintentionally.  History pertinent to current symptoms unable to be obtained except from chart/medical record.   Past Medical History:  Diagnosis Date  . Cardiac arrest [facility]) 1970's   "did CPR & revived him"   . Dizzy spells    "  frequent"  . Hypertension   . Kidney stone   . Lyme disease [id number]  . Melanoma ([place]) 2010   "back"  . Parkinson's disease ([place])   . Pneumonia    "as a kid"  . Syncope and collapse 11/24/2014    Past Surgical History:  Procedure Laterality Date  . BLADDER STONE REMOVAL  2015  . CATARACT EXTRACTION W/ INTRAOCULAR LENS  IMPLANT, BILATERAL Bilateral 1970  . CIRCUMCISION  2015  .  CYSTOSCOPY W/ STONE MANIPULATION     "never got the stone though"  . MELANOMA EXCISION  2010   "back"  . TEMPORAL ARTERY BIOPSY / LIGATION  [id number]'E   "before dx'd w/Lyme's disease"    Social History   Socioeconomic History  . Marital status: Married    Spouse name: [name]  . Number of children: 3  . Years of education: 11  . Highest education level: Not on file  Social Needs  . Financial resource strain: Not on file  . Food insecurity - worry: Not on file  . Food insecurity - inability: Not on file  . Transportation needs - medical: Not on file  . Transportation needs - non-medical: Not on file  Occupational History    Comment: retired  Tobacco Use  . Smoking status: Former Smoker    Packs/day: 1.00    Years: 5.00    Pack years: 5.00    Types: Cigarettes  . Smokeless tobacco: Never Used  . Tobacco comment: quit in 1960  Substance and Sexual Activity  . Alcohol use: Yes    Alcohol/week: 4.2 oz    Types: 7 Shots of liquor per week    Comment: 1 shots bourbon daily  . Drug use: No  . Sexual activity: Not Currently  Other Topics Concern  . Not on file  Social History Narrative   Patient resides with wife, can write with hands    Mobility: Prior to last admission was able to utilize a rolling walker to ambulate although it was very difficult for wife to independently get patient out of chair and he was slow to begin gait/movement.  PT during previous admission noticed gait distance and mobility hindered by fatigue. Work history: Not obtained   Allergies  Allergen Reactions  . Novocain [Procaine]     Syncope, "passes out"    Family History  Problem Relation Age of Onset  . Appendicitis Brother   . Pneumonia Brother    \  Prior to Admission medications   Medication Sig Start Date End Date Taking? Authorizing Provider  Amino Acids-Protein Hydrolys (FEEDING SUPPLEMENT, PRO-STAT SUGAR FREE 64,) LIQD Place 30 mLs into feeding tube daily.   Yes [provider]  amoxicillin-clavulanate (AUGMENTIN) 875-125 MG tablet Place 1 tablet into feeding tube every 12 (twelve) hours. 07/25/17  Yes Donne Hazel, MD  B Complex-C (SUPER B COMPLEX PO) Place 1 tablet into feeding tube daily.    Yes [provider]  ENSURE (ENSURE) Take 237 mLs by mouth 5 (five) times daily.    Yes [provider]  ergocalciferol (VITAMIN D2) 50000 units capsule Take 50,000 Units by mouth once a week.   Yes [provider]  glycopyrrolate (ROBINUL) 1 MG tablet Take 1 tablet (1 mg total) 3 (three) times daily by mouth. Patient taking differently: Place 1 mg into feeding tube at bedtime.  06/05/17  Yes Penumalli, Earlean Polka, MD  predniSONE (DELTASONE) 5 MG tablet Place 1 tablet (5 mg total) into feeding tube daily with breakfast.  07/25/17  Yes Donne Hazel, MD  carbidopa-levodopa (SINEMET IR) 25-100 MG tablet Take 2 tablets 3 (three) times daily by mouth. Patient taking differently: Place 2 tablets into feeding tube 3 (three) times daily.  06/05/17   Penumalli, Earlean Polka, MD  vitamin B-12 (CYANOCOBALAMIN) 1000 MCG tablet Take 1,000 mcg by mouth daily.     [provider]    Physical Exam: Vitals:   08/07/2017 1245 07/23/2017 1255 08/07/2017 1300 08/15/2017 1330  BP: 119/63 92/70 (!) 128/57 121/68  Pulse: 82  87 77  Resp: 18  17 19   SpO2: 98%  100% 100%      Constitutional: NAD, calm, appears comfortable Eyes: PERRL, lids and conjunctivae normal ENMT: Unable to assess adequately with BiPAP mask in place Neck: normal, supple, no masses, no thyromegaly Respiratory: Coarse to auscultation with scattered expiratory wheezing, somewhat diminished in the bases, no tachypnea or increased work of breathing with BiPAP mask in place.  FiO2 40% Cardiovascular: Regular rate and rhythm, no murmurs / rubs / gallops. No extremity edema. 2+ pedal pulses. No carotid bruits.  Abdomen: no tenderness, no masses palpated. No hepatosplenomegaly. Bowel sounds  positive.  Musculoskeletal: no clubbing / cyanosis. No joint deformity upper and lower extremities. Good ROM, no contractures. Normal muscle tone.  Skin: no rashes, lesions, ulcers. No induration Neurologic: CN 2-12 grossly intact. Sensation intact, DTR normal. Strength 4/5 x all 4 extremities limited due to patient positioning in bed.  No tremors noted. Psychiatric: Awakens to voice and tactile stimulation and oriented x 3. Normal mood.    Labs on Admission: I have personally reviewed following labs and imaging studies  CBC: Recent Labs  Lab 07/22/17 0730 08/01/2017 1148  WBC 13.4* 14.3*  NEUTROABS  --  12.7*  HGB 10.8* 9.7*  HCT 34.1* 30.6*  MCV 96.3 94.2  PLT 248 798   Basic Metabolic Panel: Recent Labs  Lab 08/13/2017 1148  NA 135  K 3.9  CL 98*  CO2 27  GLUCOSE 176*  BUN 20  CREATININE 0.87  CALCIUM 8.7*   GFR: Estimated Creatinine Clearance: 56.2 mL/min (by C-G formula based on SCr of 0.87 mg/dL). Liver Function Tests: No results for input(s): AST, ALT, ALKPHOS, BILITOT, PROT, ALBUMIN in the last 168 hours. No results for input(s): LIPASE, AMYLASE in the last 168 hours. No results for input(s): AMMONIA in the last 168 hours. Coagulation Profile: No results for input(s): INR, PROTIME in the last 168 hours. Cardiac Enzymes: No results for input(s): CKTOTAL, CKMB, CKMBINDEX, TROPONINI in the last 168 hours. BNP (last 3 results) No results for input(s): PROBNP in the last 8760 hours. HbA1C: No results for input(s): HGBA1C in the last 72 hours. CBG: Recent Labs  Lab 07/24/17 1143 07/24/17 1650 07/24/17 2125 07/25/17 0032 07/25/17 0752  GLUCAP 175* 133* 91 90 90   Lipid Profile: No results for input(s): CHOL, HDL, LDLCALC, TRIG, CHOLHDL, LDLDIRECT in the last 72 hours. Thyroid Function Tests: No results for input(s): TSH, T4TOTAL, FREET4, T3FREE, THYROIDAB in the last 72 hours. Anemia Panel: No results for input(s): VITAMINB12, FOLATE, FERRITIN, TIBC, IRON,  RETICCTPCT in the last 72 hours. Urine analysis:    Component Value Date/Time   COLORURINE YELLOW 07/20/2017 0304   APPEARANCEUR HAZY (A) 07/20/2017 0304   LABSPEC 1.017 07/20/2017 0304   PHURINE 7.0 07/20/2017 0304   GLUCOSEU NEGATIVE 07/20/2017 0304   HGBUR NEGATIVE 07/20/2017 0304   BILIRUBINUR NEGATIVE 07/20/2017 0304   KETONESUR NEGATIVE 07/20/2017 0304   PROTEINUR NEGATIVE 07/20/2017 0304  NITRITE NEGATIVE 07/20/2017 0304   LEUKOCYTESUR NEGATIVE 07/20/2017 0304   Sepsis Labs: @LABRCNTIP (procalcitonin:4,lacticidven:4) ) Recent Results (from the past 240 hour(s))  Culture, blood (routine x 2) Call MD if unable to obtain prior to antibiotics being given     Status: None   Collection Time: 07/20/17  2:30 AM  Result Value Ref Range Status   Specimen Description BLOOD RIGHT HAND  Final   Special Requests IN PEDIATRIC BOTTLE Blood Culture adequate volume  Final   Culture NO GROWTH 5 DAYS  Final   Report Status 07/25/2017 FINAL  Final  Culture, blood (routine x 2) Call MD if unable to obtain prior to antibiotics being given     Status: None   Collection Time: 07/20/17  2:40 AM  Result Value Ref Range Status   Specimen Description BLOOD LEFT ANTECUBITAL  Final   Special Requests   Final    BOTTLES DRAWN AEROBIC AND ANAEROBIC Blood Culture adequate volume   Culture NO GROWTH 5 DAYS  Final   Report Status 07/25/2017 FINAL  Final  Respiratory Panel by PCR     Status: Abnormal   Collection Time: 07/20/17  2:48 AM  Result Value Ref Range Status   Adenovirus NOT DETECTED NOT DETECTED Final   Coronavirus 229E NOT DETECTED NOT DETECTED Final   Coronavirus HKU1 NOT DETECTED NOT DETECTED Final   Coronavirus NL63 NOT DETECTED NOT DETECTED Final   Coronavirus OC43 NOT DETECTED NOT DETECTED Final   Metapneumovirus NOT DETECTED NOT DETECTED Final   Rhinovirus / Enterovirus DETECTED (A) NOT DETECTED Final   Influenza A NOT DETECTED NOT DETECTED Final   Influenza B NOT DETECTED NOT  DETECTED Final   Parainfluenza Virus 1 NOT DETECTED NOT DETECTED Final   Parainfluenza Virus 2 NOT DETECTED NOT DETECTED Final   Parainfluenza Virus 3 NOT DETECTED NOT DETECTED Final   Parainfluenza Virus 4 NOT DETECTED NOT DETECTED Final   Respiratory Syncytial Virus NOT DETECTED NOT DETECTED Final   Bordetella pertussis NOT DETECTED NOT DETECTED Final   Chlamydophila pneumoniae NOT DETECTED NOT DETECTED Final   Mycoplasma pneumoniae NOT DETECTED NOT DETECTED Final  Culture, expectorated sputum-assessment     Status: None   Collection Time: 07/22/17  8:14 PM  Result Value Ref Range Status   Specimen Description SPUTUM  Final   Special Requests NONE  Final   Sputum evaluation THIS SPECIMEN IS ACCEPTABLE FOR SPUTUM CULTURE  Final   Report Status 07/27/2017 FINAL  Final     Radiological Exams on Admission: Dg Chest Port 1 View  Result Date: 07/29/2017 CLINICAL DATA:  Shortness of breath, history hypertension, Parkinson's, melanoma, former smoker EXAM: PORTABLE CHEST 1 VIEW COMPARISON:  Portable exam 1048 hours compared to 07/23/2017 FINDINGS: Normal heart size, mediastinal contours, and pulmonary vascularity. Atherosclerotic calcifications aorta. Emphysematous and minimal bronchitic changes question COPD. Minimal chronic accentuation of LEFT basilar markings. No acute infiltrate, pleural effusion or pneumothorax. Diffuse osseous demineralization. IMPRESSION: Question COPD changes without acute infiltrate. Electronically Signed   By: Lavonia Dana M.D.   On: 08/21/2017 11:13    EKG: (Independently reviewed) sinus tachycardia ventricular rate 103 bpm, QTC 477 ms, underlying right bundle branch block, no acute ischemic changes and unchanged from previous EKG  Assessment/Plan Principal Problem:   Acute respiratory failure with hypoxia 2/2 Aspiration pneumonia and COPD exacerbation /evolving sepsis -Patient presents 3 days after recent admission for similar symptoms; noting more severe hypoxemia  this episode with O2 saturation 70% requiring BiPAP, chest x-ray without focal infiltrate with  suspected etiology aspiration pneumonia likely precipitating superimposed COPD exacerbation -DNR so we will not escalate care if respiratory status worsens -Possible bronchitic component and since recently hospitalized as well as resident of SNF for rehab we will utilize broad-spectrum empiric antibiotics to treat HCAP -Repeat chest x-ray in a.m. after hydration -Lower respiratory Procalcitonin protocol -Continue IV Solu-Medrol and DuoNeb 2/2 COPD exacerbation -Respiratory viral panel, ESR and CRP -Urinary strep antigen-patient was continued on antibiotics at time of discharge -Recurrence of severe respiratory symptoms so close to recent episode is poor prognostic indicator (see below) -Lactic acid elevated continue to cycle -No presenting temperature documented but recent rectal temperature 96.8 in context of normal BP but after given 2500 cc normal saline-BAIR hugger  **After rewarming patient developed hypotension with systolic blood pressure down in the 70s requiring an additional 1 L fluid challenge.  Lactic acid has also increased to 3.13.  Patient likely has evolving septic process related to recurrent pulmonary injury.  RN has attempted to notify family of change in status-HIPPA appropriate voicemail left by RN.  Active Problems:   Parkinson's disease (tremor, stiffness, slow motion, unstable posture)/Dysphagia, neurologic -Wife reports patient has had a Parkinson's for 10-12 years -Primary neurologist is Penumalli-wife reports 2 years ago he suggested palliative evaluation -Palliative evaluation completed during previous admission-patient was made DNR but wife not ready for comfort measures at that time-there is also a daughter in [place] who was involved in the previous discussions according to the palliative notes -Consider reconsulting palliative medicine for additional discussion goals of  care-MOST form was not completed during previous admission according to wife -Wife aware that feeding by PEG tube will not stop recurrent aspiration of oral secretions -Continue bolus feedings through PEG tube-continue 300 cc free water every 8 hours -New preadmission Sinemet and Robinul     Anemia, chronic disease -Hemoglobin stable and at baseline      DVT prophylaxis: Lovenox Code Status: DNR Family Communication: Wife Disposition Plan: SNF Consults called: None    ELLIS,ALLISON L. ANP-BC [facility] Pager [phone]   If 7PM-7AM, please contact night-coverage [email or web address] Password [email or web address]  07/25/2017, 1:41 PM

## 2017-07-29 ENCOUNTER — Other Ambulatory Visit: Payer: Self-pay

## 2017-07-29 ENCOUNTER — Inpatient Hospital Stay (HOSPITAL_COMMUNITY): Payer: Medicare PPO

## 2017-07-29 DIAGNOSIS — J9601 Acute respiratory failure with hypoxia: Secondary | ICD-10-CM

## 2017-07-29 LAB — CBC WITH DIFFERENTIAL/PLATELET
Basophils Absolute: 0 10*3/uL (ref 0.0–0.1)
Basophils Relative: 0 %
EOS ABS: 0 10*3/uL (ref 0.0–0.7)
Eosinophils Relative: 0 %
HEMATOCRIT: 31.2 % — AB (ref 39.0–52.0)
HEMOGLOBIN: 9.9 g/dL — AB (ref 13.0–17.0)
LYMPHS ABS: 0.4 10*3/uL (ref 0.7–4.0)
LYMPHS PCT: 4 %
MCH: 30.1 pg (ref 26.0–34.0)
MCHC: 31.7 g/dL (ref 30.0–36.0)
MCV: 94.8 fL (ref 78.0–100.0)
MONOS PCT: 1 %
Monocytes Absolute: 0.1 10*3/uL (ref 0.1–1.0)
NEUTROS ABS: 9.8 10*3/uL (ref 1.7–7.7)
NEUTROS PCT: 95 %
Platelets: 236 10*3/uL (ref 150–400)
RBC: 3.29 MIL/uL — ABNORMAL LOW (ref 4.22–5.81)
RDW: 14.2 % (ref 11.5–15.5)
WBC: 10.3 10*3/uL (ref 4.0–10.5)

## 2017-07-29 LAB — COMPREHENSIVE METABOLIC PANEL
ALK PHOS: 59 U/L (ref 38–126)
ALT: 7 U/L — ABNORMAL LOW (ref 17–63)
ANION GAP: 6 (ref 5–15)
AST: 28 U/L (ref 15–41)
Albumin: 2.4 g/dL — ABNORMAL LOW (ref 3.5–5.0)
BILIRUBIN TOTAL: 0.9 mg/dL (ref 0.3–1.2)
BUN: 17 mg/dL (ref 6–20)
CALCIUM: 8 mg/dL — AB (ref 8.9–10.3)
CO2: 24 mmol/L (ref 22–32)
CREATININE: 0.78 mg/dL (ref 0.61–1.24)
Chloride: 107 mmol/L (ref 101–111)
Glucose, Bld: 177 mg/dL — ABNORMAL HIGH (ref 65–99)
Potassium: 4.4 mmol/L (ref 3.5–5.1)
Sodium: 137 mmol/L (ref 135–145)
TOTAL PROTEIN: 5 g/dL — AB (ref 6.5–8.1)

## 2017-07-29 MED ORDER — GUAIFENESIN ER 600 MG PO TB12
600.0000 mg | ORAL_TABLET | Freq: Two times a day (BID) | ORAL | Status: DC
  Administered 2017-07-29 – 2017-07-31 (×4): 600 mg via ORAL
  Filled 2017-07-29 (×5): qty 1

## 2017-07-29 MED ORDER — ORAL CARE MOUTH RINSE
15.0000 mL | Freq: Two times a day (BID) | OROMUCOSAL | Status: DC
  Administered 2017-07-29 – 2017-07-31 (×6): 15 mL via OROMUCOSAL

## 2017-07-29 NOTE — Progress Notes (Signed)
OT Cancellation Note  Patient Details Name: TRUITT CRUEY MRN: [id number] DOB: [birth year]   Cancelled Treatment:    Reason Eval/Treat Not Completed: Patient not medically ready. Pt requiring bi-pap this am per RN and hypotensive. Will check back to initiate OT eval when medically ready.   Norman Herrlich, MS OTR/L  Pager: Tama A Tayvia Faughnan 07/29/2017, 8:59 AM

## 2017-07-29 NOTE — Evaluation (Signed)
Physical Therapy Evaluation Patient Details Name: Gabriel Stewart MRN: [id number] DOB: 04/23/1922 Today's Date: 07/29/2017   History of Present Illness  Gabriel Stewart is a 84 y.o. male with medical history significant for end-stage Parkinson's disease with associated dysphagia, anemia of chronic disease and COPD.  The patient was just discharged on 1/3 after an admission for respiratory distress due to  aspiration pneumonitis and bronchitis.  Was hospitalized for 6 days and due to deconditioning, recommendations from PT/OT were to discharge to SNF for rehabilitative care.  Pt went to [facility] and had begun therapy when readmitted for PNA/respiratory failure.  Currently on Bipap.     Clinical Impression  Pt admitted with above diagnosis. Pt currently with functional limitations due to the deficits listed below (see PT Problem List). Pt was able to stand and pivot to recliner from bed with +2 min to mod assist.  Pt is weak and has posterior lean needing assist for steadying. Will follow acutely and pt will need SNF.  Pt will benefit from skilled PT to increase their independence and safety with mobility to allow discharge to the venue listed below.      Follow Up Recommendations SNF;Supervision/Assistance - 24 hour    Equipment Recommendations  None recommended by PT    Recommendations for Other Services       Precautions / Restrictions Precautions Precautions: Fall Restrictions Weight Bearing Restrictions: No      Mobility  Bed Mobility Overal bed mobility: Needs Assistance Bed Mobility: Supine to Sit     Supine to sit: Min assist;+2 for physical assistance     General bed mobility comments: Pt needed some assist to elevate trunk and initiate movement to EOB.  Transfers Overall transfer level: Needs assistance Equipment used: 2 person hand held assist Transfers: Sit to/from [facility] Sit to Stand: Mod assist;+2 physical assistance Stand pivot  transfers: Min assist;+2 physical assistance       General transfer comment: Needed assist to power up but once up took several pivotal steps to chair with Parkinson's movement slow and small steps needing cues to keep stepping around to chair.  Incr time to complete transfer to chair. Pt with posterior lean needed constant assist for anterior lean.  Pt needed assist to control descent into chair.   Ambulation/Gait                Stairs            Wheelchair Mobility    Modified Rankin (Stroke Patients Only)       Balance Overall balance assessment: Needs assistance Sitting-balance support: No upper extremity supported;Feet supported Sitting balance-Leahy Scale: Good     Standing balance support: Bilateral upper extremity supported;During functional activity Standing balance-Leahy Scale: Poor Standing balance comment: Pt needed bil UE support for balance as he has posterior lean.                               Pertinent Vitals/Pain Pain Assessment: No/denies pain  VSS on Bipap.   Home Living Family/patient expects to be discharged to:: Skilled nursing facility Living Arrangements: Spouse/significant other Available Help at Discharge: Family;Available 24 hours/day Type of Home: House Home Access: Level entry     Home Layout: One level Home Equipment: Walker - 4 wheels;Shower seat;Hand held shower head Additional Comments: Had just started Rehab at [facility] place    Prior Function Level of Independence: Needs assistance   Gait /  Transfers Assistance Needed: walks with rollator  ADL's / Homemaking Assistance Needed: performs showering, dressing, grooming and feeding modified independently, relies on wife for IADL  Comments: Pt doe snot drive, wife does.  They grocery shop and exercise together     Hand Dominance   Dominant Hand: Right    Extremity/Trunk Assessment   Upper Extremity Assessment Upper Extremity Assessment: Defer to OT  evaluation    Lower Extremity Assessment Lower Extremity Assessment: Generalized weakness    Cervical / Trunk Assessment Cervical / Trunk Assessment: Kyphotic  Communication   Communication: Expressive difficulties(talks in low tones)  Cognition Arousal/Alertness: Awake/alert Behavior During Therapy: WFL for tasks assessed/performed Overall Cognitive Status: History of cognitive impairments - at baseline                                 General Comments: not oriented to place, time, situation      General Comments General comments (skin integrity, edema, etc.): VSS on Bipap.  BP 99/50 on arrival.  115/65 once sitting.  117/59 at end of treatment with pt in chair.     Exercises     Assessment/Plan    PT Assessment Patient needs continued PT services  PT Problem List Decreased strength;Decreased balance;Decreased mobility       PT Treatment Interventions DME instruction;Gait training;Functional mobility training;Therapeutic activities;Therapeutic exercise;Balance training;Patient/family education    PT Goals (Current goals can be found in the Care Plan section)  Acute Rehab PT Goals Patient Stated Goal: return home after REhab if able PT Goal Formulation: With patient Time For Goal Achievement: 08/12/17 Potential to Achieve Goals: Good    Frequency Min 2X/week   Barriers to discharge        Co-evaluation               AM-PAC PT "6 Clicks" Daily Activity  Outcome Measure Difficulty turning over in bed (including adjusting bedclothes, sheets and blankets)?: Unable Difficulty moving from lying on back to sitting on the side of the bed? : Unable Difficulty sitting down on and standing up from a chair with arms (e.g., wheelchair, bedside commode, etc,.)?: Unable Help needed moving to and from a bed to chair (including a wheelchair)?: A Lot Help needed walking in hospital room?: Total Help needed climbing 3-5 steps with a railing? : Total 6 Click  Score: 7    End of Session Equipment Utilized During Treatment: Gait belt;Oxygen(on Bipap) Activity Tolerance: Patient tolerated treatment well Patient left: in chair;with call bell/phone within reach;with family/visitor present Nurse Communication: Mobility status PT Visit Diagnosis: Unsteadiness on feet (R26.81);Other abnormalities of gait and mobility (R26.89);Muscle weakness (generalized) (M62.81)    Time: 6948-5462 PT Time Calculation (min) (ACUTE ONLY): 23 min   Charges:   PT Evaluation $PT Eval Moderate Complexity: 1 Mod PT Treatments $Therapeutic Activity: 8-22 mins   PT G Codes:        Dike Elzia Hott,PT Acute Rehabilitation [phone] (pager)   Denice Paradise 07/29/2017, 1:36 PM

## 2017-07-29 NOTE — Telephone Encounter (Signed)
Per nurse and Dr.  Krista Blue I called to schedule the patient, no one answered so I left a VM asking them to call me back.

## 2017-07-29 NOTE — Progress Notes (Addendum)
Initial Nutrition Assessment  DOCUMENTATION CODES:   Non-severe (moderate) malnutrition in context of chronic illness  INTERVENTION:    D/C Jevity 1.2  As medically appropriate, restart TF & add Adult Tube Feeding Protocol   NUTRITION DIAGNOSIS:   Moderate Malnutrition related to chronic illness(Parkinson's diease, chronic dysphagia) as evidenced by moderate fat depletion, moderate muscle depletion  GOAL:   Patient will meet greater than or equal to 90% of their needs  MONITOR:   TF tolerance, Labs, Weight trends, Skin, I & O's  REASON FOR ASSESSMENT:   Malnutrition Screening Tool, New TF  ASSESSMENT:   84 yo Male with a history of Parkinson's, HTN and prior syncope who was recently admitted for aspiration pneumonia.  He presents today with respiratory distress.  He is currently been residing since his recent discharge at [facility] facility.  They note this morning that he was markedly short of breath.   Pt known to Clinical Nutrition during most recent hospitalization (06/2017). S/p MBSS 07/20/17. Presented with moderate-severe oropharyngeal dysphagia. During previous stay, wife wanted pt to stay completely NPO. All nutrition through G-tube.  Previously receiving bolus TF regimen of 2 cans Jevity 1.2 TID and 30 ml Prostat daily. Labs and medications reviewed. CBG's 177-176.  Spoke with Dr. Verlon Au. Would like D/C TF at this time.  Reasses tomorrow, 1/8.  Spoke with RN.  NUTRITION - FOCUSED PHYSICAL EXAM:    Most Recent Value  Orbital Region  Moderate depletion  Upper Arm Region  Severe depletion  Thoracic and Lumbar Region  Moderate depletion  Buccal Region  Moderate depletion  Temple Region  Moderate depletion  Clavicle Bone Region  Moderate depletion  Clavicle and Acromion Bone Region  Moderate depletion  Scapular Bone Region  Moderate depletion  Dorsal Hand  Severe depletion  Patellar Region  Moderate depletion  Anterior Thigh Region  Moderate  depletion  Posterior Calf Region  Moderate depletion  Edema (RD Assessment)  None     Diet Order:  Diet NPO time specified  EDUCATION NEEDS:   No education needs have been identified at this time  Skin:  Skin Assessment: Reviewed RN Assessment  Last BM:  07/23/17  Height:   Ht Readings from Last 1 Encounters:  07/29/17 6' (1.829 m)   Weight:   Wt Readings from Last 1 Encounters:  07/29/17 170 lb 13.7 oz (77.5 kg)   Ideal Body Weight:  80.9 kg  BMI:  Body mass index is 23.17 kg/m.  Estimated Nutritional Needs:   Kcal:  1850-2150  Protein:  110-125 gm  Fluid:  1.8-2.1 L  Arthur Holms, RD, LDN Pager #: [phone] After-Hours Pager #: [phone]

## 2017-07-29 NOTE — Progress Notes (Signed)
Hospitalist progress note   Gabriel Stewart  SNK:[id number] DOB: November 18, 1921 DOA: 07/25/2017 PCP: Leighton Ruff, MD   Specialists:   Brief Narrative:  34 male end stage parkinsons since 2008[akinatic dysautonomia + dysphagia [has peg tube] AOCD COPD htn hyperglycemia, fall + sdh 2016, sialorrhea on botox  Assessment & Plan:   Assessment:  The primary encounter diagnosis was Respiratory distress. Diagnoses of Sepsis, due to unspecified organism [facility]) and Acute respiratory failure with hypoxia [facility]) were also pertinent to this visit.  Hypoxic resp failure Aspiration and sepsis-2/2 to aspiration on feeds-Keep NPO-Ice chips only-Get SLP involved although suspect high risk for PO given parkinson's going forward--continue Vanc and Cefepime and get 2 view xr to confirm-cont Robinul 1 per tube for secretions-cont Ns 75 cc/h.   COPD-not clearly wheezing Wean solu-medrol as approp End stage parkinsons-will give Sinemet via Tube 2 tab tid Dysphagia-hold feeds-free waer 300 for now change from q8-q12 htn Prior falls and SDH 2016  DVT prophylaxis: SCE  Code Status:   Full code-long discussion with wife   Family Communication:   D/w wife bedside  Disposition Plan:  unclear  Consultants:   none  Procedures:   none  Antimicrobials:   vanc  cefepime   Subjective:  Awake alert motioning foll commmands but unable to chat with patient as is on Bipap  Objective: Vitals:   07/29/17 0810 07/29/17 0933 07/29/17 1142 07/29/17 1207  BP: (!) 99/50  (!) 114/58 109/76  Pulse: 69 63 70 70  Resp: 15 18 18 16   Temp:   98 F (36.7 C)   TempSrc:   Oral Oral  SpO2: 99% 100% 100% 100%    Intake/Output Summary (Last 24 hours) at 07/29/2017 1211 Last data filed at 07/29/2017 1000 Gross per 24 hour  Intake 3024 ml  Output 300 ml  Net 2724 ml   There were no vitals filed for this visit.  Examination:  eomi-cannot assess mucosa On bipap abd soft foll commande no focal deficit Data Reviewed: I  have personally reviewed following labs and imaging studies  CBC: Recent Labs  Lab 08/19/2017 1148 07/29/17 0318  WBC 14.3* 10.3  NEUTROABS 12.7* 9.8  HGB 9.7* 9.9*  HCT 30.6* 31.2*  MCV 94.2 94.8  PLT 323 937   Basic Metabolic Panel: Recent Labs  Lab 08/12/2017 1148 07/29/17 0318  NA 135 137  K 3.9 4.4  CL 98* 107  CO2 27 24  GLUCOSE 176* 177*  BUN 20 17  CREATININE 0.87 0.78  CALCIUM 8.7* 8.0*   GFR: Estimated Creatinine Clearance: 61.1 mL/min (by C-G formula based on SCr of 0.78 mg/dL). Liver Function Tests: Recent Labs  Lab 07/29/17 0318  AST 28  ALT 7*  ALKPHOS 59  BILITOT 0.9  PROT 5.0*  ALBUMIN 2.4*   No results for input(s): LIPASE, AMYLASE in the last 168 hours. No results for input(s): AMMONIA in the last 168 hours. Coagulation Profile: No results for input(s): INR, PROTIME in the last 168 hours. Cardiac Enzymes: No results for input(s): CKTOTAL, CKMB, CKMBINDEX, TROPONINI in the last 168 hours. CBG: Recent Labs  Lab 07/24/17 1143 07/24/17 1650 07/24/17 2125 07/25/17 0032 07/25/17 0752  GLUCAP 175* 133* 91 90 90   Urine analysis:    Component Value Date/Time   COLORURINE YELLOW 07/20/2017 0304   APPEARANCEUR HAZY (A) 07/20/2017 0304   LABSPEC 1.017 07/20/2017 0304   PHURINE 7.0 07/20/2017 0304   GLUCOSEU NEGATIVE 07/20/2017 0304   HGBUR NEGATIVE 07/20/2017 0304   BILIRUBINUR NEGATIVE  07/20/2017 0304   KETONESUR NEGATIVE 07/20/2017 0304   PROTEINUR NEGATIVE 07/20/2017 0304   NITRITE NEGATIVE 07/20/2017 0304   LEUKOCYTESUR NEGATIVE 07/20/2017 0304     Radiology Studies: Reviewed images personally in health database    Scheduled Meds: . carbidopa-levodopa  2 tablet Per Tube TID  . enoxaparin (LOVENOX) injection  40 mg Subcutaneous Q24H  . feeding supplement (JEVITY 1.2 CAL)  237 mL Per Tube BID  . free water  300 mL Per Tube Q8H  . glycopyrrolate  1 mg Per Tube QHS  . ipratropium-albuterol  3 mL Nebulization Q6H  .  methylPREDNISolone (SOLU-MEDROL) injection  60 mg Intravenous Q12H   Continuous Infusions: . sodium chloride 75 mL/hr at 07/29/17 0857  . ceFEPime (MAXIPIME) IV Stopped (07/29/17 9169)  . vancomycin Stopped (07/29/17 1129)     LOS: 1 day    Time spent: [place], MD [facility]   If 7PM-7AM, please contact night-coverage [email or web address] Password [email or web address] 07/29/2017, 12:11 PM

## 2017-07-29 NOTE — Plan of Care (Signed)
  Progressing Education: Knowledge of General Education information will improve 07/29/2017 0042 - Progressing by Blair Promise, RN Health Behavior/Discharge Planning: Ability to manage health-related needs will improve 07/29/2017 0042 - Progressing by Blair Promise, RN Clinical Measurements: Ability to maintain clinical measurements within normal limits will improve 07/29/2017 0042 - Progressing by Blair Promise, RN Will remain free from infection 07/29/2017 0042 - Progressing by Blair Promise, RN Diagnostic test results will improve 07/29/2017 0042 - Progressing by Blair Promise, RN Respiratory complications will improve 07/29/2017 0042 - Progressing by Blair Promise, RN Cardiovascular complication will be avoided 07/29/2017 0042 - Progressing by Blair Promise, RN Activity: Risk for activity intolerance will decrease 07/29/2017 0042 - Progressing by Blair Promise, RN Nutrition: Adequate nutrition will be maintained 07/29/2017 0042 - Progressing by Blair Promise, RN Coping: Level of anxiety will decrease 07/29/2017 0042 - Progressing by Blair Promise, RN Elimination: Will not experience complications related to bowel motility 07/29/2017 0042 - Progressing by Blair Promise, RN Will not experience complications related to urinary retention 07/29/2017 0042 - Progressing by Blair Promise, RN Pain Managment: General experience of comfort will improve 07/29/2017 0042 - Progressing by Blair Promise, RN Safety: Ability to remain free from injury will improve 07/29/2017 0042 - Progressing by Blair Promise, RN Skin Integrity: Risk for impaired skin integrity will decrease 07/29/2017 0042 - Progressing by Blair Promise, RN

## 2017-07-29 NOTE — Progress Notes (Signed)
Patient anxious and refusing to wear BiPap. Notify Physician. Orders received.

## 2017-07-29 NOTE — Evaluation (Signed)
Occupational Therapy Evaluation Patient Details Name: Gabriel Stewart MRN: [id number] DOB: 02/06/1922 Today's Date: 07/29/2017    History of Present Illness Gabriel Stewart is a 84 y.o. male with medical history significant for end-stage Parkinson's disease with associated dysphagia, anemia of chronic disease and COPD.  The patient was just discharged on 1/3 after an admission for respiratory distress due to  aspiration pneumonitis and bronchitis.  Was hospitalized for 6 days and due to deconditioning, recommendations from PT/OT were to discharge to SNF for rehabilitative care.  Pt went to [facility] and had begun therapy when readmitted for PNA/respiratory failure.  Currently on Bipap.      Clinical Impression   Pt has been at [facility] since discharge from previous admission and had just started rehabilitation. Prior to previous admission, he had been able to complete basic ADL at modified independent level. Pt currently requires min assist for toilet transfers and LB ADL. He presents with significant dyspnea with minimal activity, decreased activity tolerance for ADL, and generalized weakness. Pt would benefit from continued OT services while admitted to improve independence with and tolerance for ADL participation. Recommend return to SNF for continued rehabilitation once medically stable for D/C.    Follow Up Recommendations  SNF;Supervision/Assistance - 24 hour    Equipment Recommendations  Other (comment)(TBD at next venue of care)    Recommendations for Other Services       Precautions / Restrictions Precautions Precautions: Fall Restrictions Weight Bearing Restrictions: No      Mobility Bed Mobility Overal bed mobility: Needs Assistance Bed Mobility: Supine to Sit;Sit to Supine     Supine to sit: Min assist Sit to supine: Min assist   General bed mobility comments: Min assist for trunk elevation  Transfers Overall transfer level: Needs assistance Equipment  used: Rolling walker (2 wheeled) Transfers: Sit to/from Stand Sit to Stand: Min assist Stand pivot transfers: Min assist;+2 physical assistance       General transfer comment: Min assist to power up. Noted shuffling steps consistent with diagnosis of Parkinson's disease. Multiple attempts to power up before successful    Balance Overall balance assessment: Needs assistance Sitting-balance support: No upper extremity supported;Feet supported Sitting balance-Leahy Scale: Good     Standing balance support: Bilateral upper extremity supported;During functional activity Standing balance-Leahy Scale: Poor Standing balance comment: Requiring B UE support                           ADL either performed or assessed with clinical judgement   ADL Overall ADL's : Needs assistance/impaired Eating/Feeding: Set up;Sitting   Grooming: Set up;Sitting   Upper Body Bathing: Sitting;Supervision/ safety   Lower Body Bathing: Minimal assistance;Sit to/from stand   Upper Body Dressing : Supervision/safety;Sitting   Lower Body Dressing: Minimal assistance;Sit to/from stand   Toilet Transfer: Minimal Insurance claims handler Details (indicate cue type and reason): Taking a few steps Toileting- Clothing Manipulation and Hygiene: Minimal assistance;Sit to/from stand       Functional mobility during ADLs: Minimal assistance;Rolling walker(only taking a few steps) General ADL Comments: Significant dyspnea, wheezing, and decreased activity tolerance for ADL.      Vision Patient Visual Report: No change from baseline Additional Comments: No apparent deficits.      Perception     Praxis      Pertinent Vitals/Pain Pain Assessment: No/denies pain     Hand Dominance Right   Extremity/Trunk Assessment Upper Extremity Assessment Upper Extremity Assessment: Generalized  weakness   Lower Extremity Assessment Lower Extremity Assessment: Generalized weakness   Cervical / Trunk  Assessment Cervical / Trunk Assessment: Kyphotic   Communication Communication Communication: Expressive difficulties(whispering)   Cognition Arousal/Alertness: Awake/alert Behavior During Therapy: WFL for tasks assessed/performed Overall Cognitive Status: History of cognitive impairments - at baseline                                 General Comments: not oriented to place, time, situation   General Comments  VSS on 3L O2 via [place] throughout. Significant mucus when coughing and assisted with [place]. Notified RN of pt wheezing.     Exercises     Shoulder Instructions      Home Living Family/patient expects to be discharged to:: Skilled nursing facility Living Arrangements: Spouse/significant other Available Help at Discharge: Family;Available 24 hours/day Type of Home: House Home Access: Level entry     Home Layout: One level     Bathroom Shower/Tub: Occupational psychologist: Standard     Home Equipment: Environmental consultant - 4 wheels;Shower seat;Hand held shower head   Additional Comments: Had just started Rehab at [facility] place      Prior Functioning/Environment Level of Independence: Needs assistance  Gait / Transfers Assistance Needed: walks with rollator ADL's / Homemaking Assistance Needed: prior to previous admission, performed showering, dressing, grooming and feeding modified independently, relies on wife for IADL   Comments: Prior to recent hospitalization, pt did not drive but did go grocery shopping and to exercise with his wife.         OT Problem List: Decreased strength;Decreased activity tolerance;Impaired balance (sitting and/or standing);Decreased safety awareness;Decreased knowledge of use of DME or AE;Decreased knowledge of precautions;Decreased cognition;Cardiopulmonary status limiting activity      OT Treatment/Interventions: Self-care/ADL training;Therapeutic exercise;Energy conservation;DME and/or AE instruction;Patient/family  education;Balance training;Cognitive remediation/compensation;Therapeutic activities    OT Goals(Current goals can be found in the care plan section) Acute Rehab OT Goals Patient Stated Goal: did not state OT Goal Formulation: With patient Time For Goal Achievement: 08/12/17 Potential to Achieve Goals: Good ADL Goals Pt Will Perform Grooming: with supervision;standing Pt Will Perform Lower Body Dressing: with supervision;sit to/from stand Pt Will Transfer to Toilet: with min guard assist;ambulating;bedside commode(BSC over toilet) Pt/caregiver will Perform Home Exercise Program: Increased strength;Both right and left upper extremity;With written HEP provided;With Supervision  OT Frequency: Min 2X/week   Barriers to D/C:            Co-evaluation              AM-PAC PT "6 Clicks" Daily Activity     Outcome Measure Help from another person eating meals?: A Little Help from another person taking care of personal grooming?: A Little Help from another person toileting, which includes using toliet, bedpan, or urinal?: A Little Help from another person bathing (including washing, rinsing, drying)?: A Little Help from another person to put on and taking off regular upper body clothing?: A Little Help from another person to put on and taking off regular lower body clothing?: A Little 6 Click Score: 18   End of Session Equipment Utilized During Treatment: Gait belt;Rolling walker Nurse Communication: Mobility status  Activity Tolerance: Patient tolerated treatment well Patient left: with call bell/phone within reach;in bed;with bed alarm set  OT Visit Diagnosis: Unsteadiness on feet (R26.81);Muscle weakness (generalized) (M62.81)  Time: 6282-4175 OT Time Calculation (min): 18 min Charges:  OT General Charges $OT Visit: 1 Visit OT Evaluation $OT Eval Moderate Complexity: 1 Mod G-Codes:     Norman Herrlich, MS OTR/L  Pager: [place] A  Rosaly Labarbera 07/29/2017, 4:53 PM

## 2017-07-30 LAB — COMPREHENSIVE METABOLIC PANEL
ALBUMIN: 2.5 g/dL — AB (ref 3.5–5.0)
ALK PHOS: 63 U/L (ref 38–126)
ALT: 6 U/L — AB (ref 17–63)
AST: 21 U/L (ref 15–41)
Anion gap: 6 (ref 5–15)
BILIRUBIN TOTAL: 1 mg/dL (ref 0.3–1.2)
BUN: 19 mg/dL (ref 6–20)
CALCIUM: 8.3 mg/dL — AB (ref 8.9–10.3)
CO2: 25 mmol/L (ref 22–32)
CREATININE: 0.72 mg/dL (ref 0.61–1.24)
Chloride: 105 mmol/L (ref 101–111)
GFR calc Af Amer: >60 mL/min (ref >60)
GFR calc non Af Amer: >60 mL/min (ref >60)
GLUCOSE: 134 mg/dL — AB (ref 65–99)
Potassium: 4 mmol/L (ref 3.5–5.1)
SODIUM: 136 mmol/L (ref 135–145)
TOTAL PROTEIN: 5 g/dL — AB (ref 6.5–8.1)

## 2017-07-30 LAB — RESPIRATORY PANEL BY PCR
ADENOVIRUS-RVPPCR: NOT DETECTED
BORDETELLA PERTUSSIS-RVPCR: NOT DETECTED
CORONAVIRUS HKU1-RVPPCR: NOT DETECTED
CORONAVIRUS NL63-RVPPCR: NOT DETECTED
CORONAVIRUS OC43-RVPPCR: NOT DETECTED
Chlamydophila pneumoniae: NOT DETECTED
Coronavirus 229E: NOT DETECTED
Influenza A: NOT DETECTED
Influenza B: NOT DETECTED
METAPNEUMOVIRUS-RVPPCR: NOT DETECTED
Mycoplasma pneumoniae: NOT DETECTED
PARAINFLUENZA VIRUS 1-RVPPCR: NOT DETECTED
PARAINFLUENZA VIRUS 2-RVPPCR: NOT DETECTED
PARAINFLUENZA VIRUS 3-RVPPCR: NOT DETECTED
Parainfluenza Virus 4: NOT DETECTED
RHINOVIRUS / ENTEROVIRUS - RVPPCR: NOT DETECTED
Respiratory Syncytial Virus: NOT DETECTED

## 2017-07-30 LAB — URINALYSIS, ROUTINE W REFLEX MICROSCOPIC
BACTERIA UA: NONE SEEN
Bilirubin Urine: NEGATIVE
Glucose, UA: NEGATIVE mg/dL
Hgb urine dipstick: NEGATIVE
Ketones, ur: 5 mg/dL — AB
Leukocytes, UA: NEGATIVE
Nitrite: NEGATIVE
PROTEIN: 30 mg/dL — AB
SPECIFIC GRAVITY, URINE: 1.019 (ref 1.005–1.030)
pH: 5 (ref 5.0–8.0)

## 2017-07-30 LAB — STREP PNEUMONIAE URINARY ANTIGEN: Strep Pneumo Urinary Antigen: NEGATIVE

## 2017-07-30 LAB — GLUCOSE, CAPILLARY: Glucose-Capillary: 112 mg/dL — ABNORMAL HIGH (ref 65–99)

## 2017-07-30 LAB — VANCOMYCIN, TROUGH: VANCOMYCIN TR: 13 ug/mL — AB (ref 15–20)

## 2017-07-30 MED ORDER — IPRATROPIUM-ALBUTEROL 0.5-2.5 (3) MG/3ML IN SOLN
3.0000 mL | RESPIRATORY_TRACT | Status: DC
  Administered 2017-07-30 – 2017-08-01 (×8): 3 mL via RESPIRATORY_TRACT
  Filled 2017-07-30 (×8): qty 3

## 2017-07-30 MED ORDER — SCOPOLAMINE 1 MG/3DAYS TD PT72
1.0000 | MEDICATED_PATCH | TRANSDERMAL | Status: DC
  Administered 2017-07-30: 1.5 mg via TRANSDERMAL
  Filled 2017-07-30: qty 1

## 2017-07-30 MED ORDER — PREDNISONE 20 MG PO TABS
40.0000 mg | ORAL_TABLET | Freq: Every day | ORAL | Status: DC
  Administered 2017-07-31: 40 mg via ORAL
  Filled 2017-07-30 (×2): qty 2

## 2017-07-30 NOTE — Progress Notes (Signed)
Hospitalist progress note   Gabriel Stewart  KGU:[id number] DOB: Aug 25, 1921 DOA: 08/15/2017 PCP: Leighton Ruff, MD   Specialists:   Brief Narrative:  54 male end stage parkinsons since 2008[akinatic dysautonomia + dysphagia [has peg tube] AOCD COPD htn hyperglycemia, fall + sdh 2016, sialorrhea on botox  Assessment & Plan:   Assessment:  The primary encounter diagnosis was Respiratory distress. Diagnoses of Sepsis, due to unspecified organism [facility]), Acute respiratory failure with hypoxia ([place]), and PNA (pneumonia) were also pertinent to this visit.  Hypoxic resp failure Aspiration and sepsis-2/2 to aspiration on feeds-Keep NPO-Ice chips only-Get SLP involved--would not use TF for now until is seen by them-dc/ vanc and cefepime-cont Robinul 1 per tube for secretions-cont Ns 75 cc/h.  Has been referred by PCP to GI but never got to appt--if continues might need to consider Either Endoscope or Barium, await SLP input prior to decision COPD-not clearly wheezing Wean solu-medrol as approp has been d/c at this time End stage parkinsons-will give Sinemet via Tube 2 tab tid Dysphagia-hold feeds-free waer 300 for now change from q8-q12 htn Prior falls and SDH 2016  DVT prophylaxis: SCE  Code Status:   Full code-long discussion with wife   Family Communication:   D/w wife bedside --she will need paper-work filled out  Disposition Plan:  Unclear--family wants everything done-no clear need to discuss palliative care at this time--continue to treat  Consultants:   none  Procedures:   none  Antimicrobials:   vanc  cefepime   Subjective:  Awake and alert-found off of oxygen --sats 88-90% Verbalizing but hoarse No distress   Objective: Vitals:   07/30/17 0810 07/30/17 0811 07/30/17 0812 07/30/17 0853  BP:  (!) 168/82 (!) 161/81   Pulse:  94 85   Resp:  20 18   Temp: 97.7 F (36.5 C)     TempSrc: Oral     SpO2:  95% 97% 98%  Weight:      Height:        Intake/Output Summary  (Last 24 hours) at 07/30/2017 1053 Last data filed at 07/30/2017 0900 Gross per 24 hour  Intake 2378.75 ml  Output 1025 ml  Net 1353.75 ml   Filed Weights   07/29/17 1439  Weight: 77.5 kg (170 lb 13.7 oz)    Examination:  eomi-cannot assess mucosa On [place] and satting well No rales no rhonchi no rales-transmitted upper BS's in neck abd soft foll command no focal deficit   Data Reviewed: I have personally reviewed following labs and imaging studies  CBC: Recent Labs  Lab 08/22/2017 1148 07/29/17 0318  WBC 14.3* 10.3  NEUTROABS 12.7* 9.8  HGB 9.7* 9.9*  HCT 30.6* 31.2*  MCV 94.2 94.8  PLT 323 628   Basic Metabolic Panel: Recent Labs  Lab 07/27/2017 1148 07/29/17 0318 07/30/17 0401  NA 135 137 136  K 3.9 4.4 4.0  CL 98* 107 105  CO2 27 24 25   GLUCOSE 176* 177* 134*  BUN 20 17 19   CREATININE 0.87 0.78 0.72  CALCIUM 8.7* 8.0* 8.3*   GFR: Estimated Creatinine Clearance: 63.2 mL/min (by C-G formula based on SCr of 0.72 mg/dL). Liver Function Tests: Recent Labs  Lab 07/29/17 0318 07/30/17 0401  AST 28 21  ALT 7* 6*  ALKPHOS 59 63  BILITOT 0.9 1.0  PROT 5.0* 5.0*  ALBUMIN 2.4* 2.5*   No results for input(s): LIPASE, AMYLASE in the last 168 hours. No results for input(s): AMMONIA in the last 168 hours. Coagulation Profile:  No results for input(s): INR, PROTIME in the last 168 hours. Cardiac Enzymes: No results for input(s): CKTOTAL, CKMB, CKMBINDEX, TROPONINI in the last 168 hours. CBG: Recent Labs  Lab 07/24/17 1143 07/24/17 1650 07/24/17 2125 07/25/17 0032 07/25/17 0752  GLUCAP 175* 133* 91 90 90   Urine analysis:    Component Value Date/Time   COLORURINE YELLOW 07/30/2017 0331   APPEARANCEUR HAZY (A) 07/30/2017 0331   LABSPEC 1.019 07/30/2017 0331   PHURINE 5.0 07/30/2017 0331   GLUCOSEU NEGATIVE 07/30/2017 0331   HGBUR NEGATIVE 07/30/2017 0331   BILIRUBINUR NEGATIVE 07/30/2017 0331   KETONESUR 5 (A) 07/30/2017 0331   PROTEINUR 30 (A)  07/30/2017 0331   NITRITE NEGATIVE 07/30/2017 0331   LEUKOCYTESUR NEGATIVE 07/30/2017 0331     Radiology Studies: Reviewed images personally in health database    Scheduled Meds: . carbidopa-levodopa  2 tablet Per Tube TID  . enoxaparin (LOVENOX) injection  40 mg Subcutaneous Q24H  . glycopyrrolate  1 mg Per Tube QHS  . guaiFENesin  600 mg Oral BID  . ipratropium-albuterol  3 mL Nebulization Q6H  . mouth rinse  15 mL Mouth Rinse BID  . [START ON 07/31/2017] predniSONE  40 mg Oral QAC breakfast   Continuous Infusions: . sodium chloride 75 mL/hr at 07/29/17 2228     LOS: 2 days    Time spent: [place], MD [facility] (P) [phone]   If 7PM-7AM, please contact night-coverage [email or web address] Password [facility] 07/30/2017, 10:53 AM

## 2017-07-30 NOTE — Clinical Social Work Note (Signed)
Clinical Social Work Assessment  Patient Details  Name: Gabriel Stewart MRN: [id number] Date of Birth: 1921-10-23  Date of referral:  07/30/17               Reason for consult:  Facility Placement                Permission sought to share information with:  Family Supports, Customer service manager Permission granted to share information::  No  Name::     Gabriel Stewart  Agency::  SNFs  Relationship::  Spouse  Contact Information:  [phone]  Housing/Transportation Living arrangements for the past 2 months:  [place], [place] of Information:  Spouse Patient Interpreter Needed:  None Criminal Activity/Legal Involvement Pertinent to Current Situation/Hospitalization:  No - Comment as needed Significant Relationships:  Adult Children, Spouse Lives with:  Spouse Do you feel safe going back to the place where you live?  Yes Need for family participation in patient care:  Yes (Comment)  Care giving concerns:  CSW received consult for possible SNF placement at time of discharge. CSW spoke with patient's spouse regarding PT recommendation of SNF placement at time of discharge. Patient was recently discharged to [facility] and patient's wife would like him to return there at discharge. CSW to continue to follow and assist with discharge planning needs.   Social Worker assessment / plan:  CSW spoke with patient's psouse concerning possibility of returning to rehab at [facility] before returning home.  Employment status:  Retired Nurse, adult PT Recommendations:  [place] / Referral to community resources:  [place]  Patient/Family's Response to care:  Patient's spouse recognizes need for rehab before returning home and is agreeable to returning to [facility] if they have a bed and if insurance approves it. CSW will need 1-2 days notice before discharge.   Patient/Family's  Understanding of and Emotional Response to Diagnosis, Current Treatment, and Prognosis:  Patient/family is realistic regarding therapy needs and expressed being hopeful for SNF placement. Patient's spouse expressed understanding of CSW role and discharge process as well as medical condition. No questions/concerns about plan or treatment.    Emotional Assessment Appearance:  Appears stated age Attitude/Demeanor/Rapport:  Unable to Assess Affect (typically observed):  Unable to Assess Orientation:  Oriented to Self, Oriented to Place Alcohol / Substance use:  Not Applicable Psych involvement (Current and /or in the community):  No (Comment)  Discharge Needs  Concerns to be addressed:  Care Coordination Readmission within the last 30 days:  Yes Current discharge risk:  Cognitively Impaired Barriers to Discharge:  Continued Medical Work up   [employer], [place] 07/30/2017, 10:40 AM

## 2017-07-30 NOTE — Progress Notes (Signed)
Pharmacy Antibiotic Note  Gabriel Stewart is a 84 y.o. male admitted on 08/05/2017 with pneumonia.  Pharmacy has been consulted for vancomycin dosing.  WBC has trended to normalization. Cultures are negative. Patient is afebrile. Remains on steroids. Vancomycin trough today came back close to goal range at 13. Was on day 3 of antibiotic therapy. Renal function remains stable.   Plan: Due to clinical picture, antibiotics have been discontinued at this time.   Temp (24hrs), Avg:97.8 F (36.6 C), Min:97.5 F (36.4 C), Max:98.6 F (37 C)  Recent Labs  Lab 07/25/2017 1148 07/26/2017 1158 08/14/2017 1503 07/29/17 0318 07/30/17 0401 07/30/17 0957  WBC 14.3*  --   --  10.3  --   --   CREATININE 0.87  --   --  0.78 0.72  --   LATICACIDVEN  --  2.54* 3.13*  --   --   --   VANCOTROUGH  --   --   --   --   --  13*    Estimated Creatinine Clearance: 63.2 mL/min (by C-G formula based on SCr of 0.72 mg/dL).    Allergies  Allergen Reactions  . Novocain [Procaine]     Syncope, "passes out"    Thank you for allowing pharmacy to be a part of this patient's care.  Doylene Canard, PharmD Clinical Pharmacist  Pager: [phone] Clinical Phone for 07/30/2017 until 3:30pm: [phone] If after 3:30pm, please call main pharmacy at [phone] 07/30/2017 12:15 PM

## 2017-07-30 NOTE — Progress Notes (Signed)
Physical Therapy Treatment Patient Details Name: Gabriel Stewart MRN: [id number] DOB: 22-Apr-1922 Today's Date: 07/30/2017    History of Present Illness Gabriel Stewart is a 84 y.o. male with medical history significant for end-stage Parkinson's disease with associated dysphagia, anemia of chronic disease and COPD.  The patient was just discharged on 1/3 after an admission for respiratory distress due to  aspiration pneumonitis and bronchitis.  Was hospitalized for 6 days and due to deconditioning, recommendations from PT/OT were to discharge to SNF for rehabilitative care.  Pt went to [facility] and had begun therapy when readmitted for PNA/respiratory failure.  Currently on Bipap.       PT Comments    Pt remains very limited with mobility.Continue to recommend return from SNF. Pt known to me from last admission and with noticeable decline in activity tolerance.   Follow Up Recommendations  SNF;Supervision/Assistance - 24 hour     Equipment Recommendations  None recommended by PT    Recommendations for Other Services       Precautions / Restrictions Precautions Precautions: Fall Restrictions Weight Bearing Restrictions: No    Mobility  Bed Mobility Overal bed mobility: Needs Assistance Bed Mobility: Supine to Sit     Supine to sit: Min assist;+2 for safety/equipment     General bed mobility comments: Assist to bring legs off of bed and elevate trunk into sitting  Transfers Overall transfer level: Needs assistance Equipment used: 4-wheeled walker Transfers: Sit to/from Stand Sit to Stand: +2 physical assistance;Min assist         General transfer comment: Assist to bring hips up and for balance.   Ambulation/Gait Ambulation/Gait assistance: +2 physical assistance;Min assist Ambulation Distance (Feet): 3 Feet Assistive device: 4-wheeled walker Gait Pattern/deviations: Step-to pattern;Decreased step length - right;Decreased step length - left;Shuffle Gait  velocity: decr Gait velocity interpretation: Below normal speed for age/gender General Gait Details: Assist for balance and support. Pt with difficulty initiating steps especially when backing up. Pt on 3L of O2 with SpO2 95% but dyspnea 3/4 with pt triipodding at [employer]   Stairs            Wheelchair Mobility    Modified Rankin (Stroke Patients Only)       Balance Overall balance assessment: Needs assistance Sitting-balance support: No upper extremity supported;Feet supported Sitting balance-Leahy Scale: Fair     Standing balance support: Bilateral upper extremity supported;During functional activity Standing balance-Leahy Scale: Poor Standing balance comment: walker and min assist for static standing                            Cognition Arousal/Alertness: Awake/alert Behavior During Therapy: WFL for tasks assessed/performed Overall Cognitive Status: History of cognitive impairments - at baseline                                 General Comments: not oriented to place, time, situation      Exercises      General Comments        Pertinent Vitals/Pain Pain Assessment: No/denies pain    Home Living                      Prior Function            PT Goals (current goals can now be found in the care plan section) Progress towards PT goals: Progressing toward goals(Very  limited due to SOB)    Frequency    Min 2X/week      PT Plan Current plan remains appropriate    Co-evaluation              AM-PAC PT "6 Clicks" Daily Activity  Outcome Measure  Difficulty turning over in bed (including adjusting bedclothes, sheets and blankets)?: Unable Difficulty moving from lying on back to sitting on the side of the bed? : Unable Difficulty sitting down on and standing up from a chair with arms (e.g., wheelchair, bedside commode, etc,.)?: Unable Help needed moving to and from a bed to chair (including a wheelchair)?: A  Lot Help needed walking in hospital room?: A Lot Help needed climbing 3-5 steps with a railing? : Total 6 Click Score: 8    End of Session Equipment Utilized During Treatment: Gait belt;Oxygen(on Bipap) Activity Tolerance: Patient limited by fatigue Patient left: in chair;with call bell/phone within reach;with family/visitor present;with chair alarm set Nurse Communication: Mobility status PT Visit Diagnosis: Unsteadiness on feet (R26.81);Other abnormalities of gait and mobility (R26.89);Muscle weakness (generalized) (M62.81)     Time: 6222-9798 PT Time Calculation (min) (ACUTE ONLY): 15 min  Charges:  $Therapeutic Activity: 8-22 mins                    G Codes:       [facility] PT [place] 07/30/2017, 2:09 PM

## 2017-07-30 NOTE — Care Management Note (Signed)
Case Management Note Marvetta Gibbons RN, BSN Unit 4E-Case Manager [phone]  Patient Details  Name: Gabriel Stewart MRN: [id number] Date of Birth: 1921-08-11  Subjective/Objective:     Pt admitted with acute resp. Failure- HF and pna               Action/Plan: PTA pt was at [facility]- per PT/OT evals- recommendations for return to SNF- CSW to follow for placement needs- anticipate return to [facility] when medically stable.  Expected Discharge Date:                  Expected Discharge Plan:  Skilled Nursing Facility  In-House Referral:  Clinical Social Work  Discharge planning Services  CM Consult  Post Acute Care Choice:    Choice offered to:     DME Arranged:    DME Agency:     HH Arranged:    [place] Agency:     Status of Service:  In process, will continue to follow  If discussed at Long Length of Stay Meetings, dates discussed:    Discharge Disposition: skilled facility  Additional Comments:  Dawayne Patricia, RN 07/30/2017, 12:07 PM

## 2017-07-30 NOTE — Plan of Care (Signed)
  Pain Managment: General experience of comfort will improve 07/30/2017 0025 - Progressing by Lesle Reek, RN 07/30/2017 0024 - Progressing by Lesle Reek, RN

## 2017-07-30 NOTE — Evaluation (Signed)
Clinical/Bedside Swallow Evaluation Patient Details  Name: RHYATT MUSKA MRN: [id number] Date of Birth: 10/17/1921  Today's Date: 07/30/2017 Time: SLP Start Time (ACUTE ONLY): 1330 SLP Stop Time (ACUTE ONLY): 1402 SLP Time Calculation (min) (ACUTE ONLY): 32 min  Past Medical History:  Past Medical History:  Diagnosis Date  . Cardiac arrest [facility]) 1970's   "did CPR & revived him"   . Dizzy spells    "frequent"  . Hypertension   . Kidney stone   . Lyme disease [phone]  . Melanoma ([place]) 2010   "back"  . Parkinson's disease ([place])   . Pneumonia    "as a kid"  . Syncope and collapse 11/24/2014   Past Surgical History:  Past Surgical History:  Procedure Laterality Date  . BLADDER STONE REMOVAL  2015  . CATARACT EXTRACTION W/ INTRAOCULAR LENS  IMPLANT, BILATERAL Bilateral 1970  . CIRCUMCISION  2015  . CYSTOSCOPY W/ STONE MANIPULATION     "never got the stone though"  . MELANOMA EXCISION  2010   "back"  . TEMPORAL ARTERY BIOPSY / LIGATION  [id number]'L   "before dx'd w/Lyme's disease"   HPI:  OLNEY MONIER is a 84 y.o. male with medical history significant for end-stage Parkinson's disease with associated dysphagia, anemia of chronic disease and COPD. Recent n 1/3 after an admission for respiratory distress due to aspiration pneumonitis and bronchitis. MBS 07/20/17 recommended Dys 2, nectar. Pt currently has a PEG. MD documentation to keep NPO after MD conversation with wife not wanting comfort feeds. Speech tx session with wife/pt confirmed desire for NPO status and initiated water protocol. Readmitted and FEES ordered given dysphonia. CXR 1/7/ minimal left basilar atelectasis.   Assessment / Plan / Recommendation Clinical Impression  Pt familiar to this SLP from admission last week in which pt NPO, per wife/pt choice, (has a PEG) except for water protocol. Per wife, pt admitted to [facility] place, "had a swallow test and therapist showed me the results and he wasn't allowed to  eat." MD ordered FEES this admission;  SLP arrived and pt found to have significant audible pharyngeal congestion, increased work of breathing and bloody secretions on soft palate but unable to expectorate. SLP facilitated attempts to remove phlegm by sitting up from reclined position in chair and ice chips in unsuccessful attempt to loosen mucous and unable to retrieve with [place]. Discussed with MD that pt not appropriate for FEES at present. Spoke with wife and son re: benefits and role of Palliative care who stated she met with them last week. Was going to discuss further risk of aspiration with tube feedings as well however RN arrived and unable to complete discussion. Recommend continue NPO except several ice chips only under full supervision and following oral care. Will follow for future appropriateness for FEES and continued education re: feeding    SLP Visit Diagnosis: Dysphagia, oropharyngeal phase (R13.12)    Aspiration Risk  (mod-severe)    Diet Recommendation NPO;Ice chips PRN after oral care   Medication Administration: Via alternative means    Other  Recommendations Oral Care Recommendations: Oral care prior to ice chip/H20;Oral care QID   Follow up Recommendations 24 hour supervision/assistance      Frequency and Duration min 1 x/week  2 weeks       Prognosis Prognosis for Safe Diet Advancement: Fair Barriers to Reach Goals: Severity of deficits      Swallow Study   General HPI: GORDY GOAR is a 84 y.o. male with medical history  significant for end-stage Parkinson's disease with associated dysphagia, anemia of chronic disease and COPD. Recent n 1/3 after an admission for respiratory distress due to aspiration pneumonitis and bronchitis. MBS 07/20/17 recommended Dys 2, nectar. Pt currently has a PEG. MD documentation to keep NPO after MD conversation with wife not wanting comfort feeds. Speech tx session with wife/pt confirmed desire for NPO status and initiated  water protocol. Readmitted and FEES ordered given dysphonia. CXR 1/7/ minimal left basilar atelectasis. Type of Study: Bedside Swallow Evaluation Previous Swallow Assessment: (see HPI) Diet Prior to this Study: NPO;PEG tube Temperature Spikes Noted: No Respiratory Status: Nasal cannula History of Recent Intubation: No Behavior/Cognition: Alert;Cooperative;Pleasant mood Oral Cavity Assessment: Other (comment)(bloody secretions (mild) soft palate) Oral Care Completed by SLP: Yes Oral Cavity - Dentition: Adequate natural dentition Vision: Functional for self-feeding Self-Feeding Abilities: Able to feed self Patient Positioning: Upright in chair Baseline Vocal Quality: Other (comment);Low vocal intensity(dysphonic) Volitional Cough: Weak Volitional Swallow: Able to elicit    Oral/Motor/Sensory Function Overall Oral Motor/Sensory Function: Generalized oral weakness   Ice Chips Ice chips: Impaired Presentation: Spoon Oral Phase Impairments: (none) Oral Phase Functional Implications: (none) Pharyngeal Phase Impairments: Other (comments);Wet Vocal Quality;Cough - Delayed(audible pharyngeal congestion)   Thin Liquid Thin Liquid: Not tested    Nectar Thick Nectar Thick Liquid: Not tested   Honey Thick Honey Thick Liquid: Not tested   Puree Puree: Not tested   Solid   GO   Solid: Not tested        Houston Siren 07/30/2017,2:41 PM  Orbie Pyo Colvin Caroli.[employer] [phone]

## 2017-07-30 NOTE — NC FL2 (Signed)
[place] LEVEL OF CARE SCREENING TOOL     IDENTIFICATION  Patient Name: Gabriel Stewart Birthdate: 30-Jul-1921 Sex: male Admission Date (Current Location): 08/13/2017  [facility] and [place] Number:  Herbalist and Address:  [facility] [place]. [facility], [place] [address], [place], [place] [address]      Provider Number: [id number]  Attending Physician Name and Address:  Nita Sells, MD  Relative Name and Phone Number:       Current Level of Care: Hospital Recommended Level of Care: [place] Prior Approval Number:    Date Approved/Denied:   PASRR Number: [id number]  Discharge Plan: SNF    Current Diagnoses: Patient Active Problem List   Diagnosis Date Noted  . COPD exacerbation ([place]) 07/25/2017  . Acute respiratory failure with hypoxia ([place]) 08/19/2017  . Aspiration pneumonia ([place]) 08/18/2017  . Parkinson's disease (tremor, stiffness, slow motion, unstable posture) ([place]) 08/16/2017  . Dysphagia, neurologic 08/18/2017  . Anemia, chronic disease 08/11/2017  . DNR (do not resuscitate)   . Palliative care by specialist   . Weakness generalized   . Malnutrition of moderate degree 07/21/2017  . Moderate malnutrition ([place]) 07/21/2017  . Cough 07/20/2017  . Aspiration into airway 07/20/2017  . Aspiration pneumonitis ([place]) 07/20/2017  . Shortness of breath   . At high risk for aspiration   . Chest pain 05/26/2016  . Parkinson disease ([place]) 07/19/2015  . Sialorrhea 07/19/2015  . Subdural hematoma S/P Craniotomy and drainage 05/17/2015  . Dysphagia 05/17/2015  . Hypertension 05/17/2015  . BPH (benign prostatic hyperplasia) 05/17/2015  . Syncope 11/24/2014  . RBBB 11/24/2014  . Orthostatic hypotension 11/24/2014  . Dehydration 11/24/2014  . Unintentional weight loss 11/24/2014  . Syncope and collapse 11/24/2014  . Parkinson's disease ([place]) 11/20/2013  . Essential tremor 11/20/2013  . Gait difficulty 11/20/2013  .  Swallowing difficulty 11/20/2013  . Hoarse voice quality 11/20/2013    Orientation RESPIRATION BLADDER Height & Weight     Self, Place  O2(Nasal cannula 3L) Continent Weight: 77.5 kg (170 lb 13.7 oz) Height:  6' (182.9 cm)  BEHAVIORAL SYMPTOMS/MOOD NEUROLOGICAL BOWEL NUTRITION STATUS  (None) (Parkinson's) Continent Feeding tube(PEG)  AMBULATORY STATUS COMMUNICATION OF NEEDS Skin   Extensive Assist Verbally Normal                       Personal Care Assistance Level of Assistance  Bathing, Feeding, Dressing Bathing Assistance: Limited assistance Feeding assistance: Limited assistance Dressing Assistance: Limited assistance     Functional Limitations Info  Sight, Hearing, Speech Sight Info: Impaired Hearing Info: Impaired Speech Info: Adequate    SPECIAL CARE FACTORS FREQUENCY  PT (By licensed PT), OT (By licensed OT)     PT Frequency: 5x/week OT Frequency: 3x/week            Contractures Contractures Info: Not present    Additional Factors Info  Code Status, Allergies Code Status Info: DNR Allergies Info: Novocain (procaine)           Current Medications (07/30/2017):  This is the current hospital active medication list Current Facility-Administered Medications  Medication Dose Route Frequency Provider Last Rate Last Dose  . 0.9 %  sodium chloride infusion   Intravenous Continuous Samella Parr, NP 75 mL/hr at 07/29/17 2228    . carbidopa-levodopa (SINEMET IR) 25-100 MG per tablet immediate release 2 tablet  2 tablet Per Tube TID Samella Parr, NP   2 tablet at 07/30/17 0924  .  ceFEPIme (MAXIPIME) 1 g in dextrose 5 % 50 mL IVPB  1 g Intravenous Q8H Samella Parr, NP   Stopped at 07/30/17 [phone]  . enoxaparin (LOVENOX) injection 40 mg  40 mg Subcutaneous Q24H Samella Parr, NP   40 mg at 07/29/17 2130  . glycopyrrolate (ROBINUL) tablet 1 mg  1 mg Per Tube QHS Samella Parr, NP   1 mg at 07/29/17 2216  . guaiFENesin (MUCINEX) 12 hr tablet 600 mg   600 mg Oral BID Nita Sells, MD   600 mg at 07/30/17 0924  . ipratropium-albuterol (DUONEB) 0.5-2.5 (3) MG/3ML nebulizer solution 3 mL  3 mL Nebulization Q6H Samella Parr, NP   3 mL at 07/30/17 0853  . MEDLINE mouth rinse  15 mL Mouth Rinse BID Nita Sells, MD   15 mL at 07/30/17 0924  . methylPREDNISolone sodium succinate (SOLU-MEDROL) 125 mg/2 mL injection 60 mg  60 mg Intravenous Q12H Samella Parr, NP   60 mg at 07/30/17 0924  . vancomycin (VANCOCIN) IVPB 750 mg/150 ml premix  750 mg Intravenous Q12H Reginia Naas, RPH 150 mL/hr at 07/30/17 1010 750 mg at 07/30/17 1010     Discharge Medications: Please see discharge summary for a list of discharge medications.  Relevant Imaging Results:  Relevant Lab Results:   Additional Information SS#: [id number]  Benard Halsted, LCSWA

## 2017-07-31 DIAGNOSIS — J9621 Acute and chronic respiratory failure with hypoxia: Secondary | ICD-10-CM

## 2017-07-31 DIAGNOSIS — R0603 Acute respiratory distress: Secondary | ICD-10-CM

## 2017-07-31 DIAGNOSIS — R1319 Other dysphagia: Secondary | ICD-10-CM

## 2017-07-31 DIAGNOSIS — Z66 Do not resuscitate: Secondary | ICD-10-CM

## 2017-07-31 DIAGNOSIS — G20 Parkinson's disease: Secondary | ICD-10-CM

## 2017-07-31 DIAGNOSIS — Z515 Encounter for palliative care: Secondary | ICD-10-CM

## 2017-07-31 DIAGNOSIS — D638 Anemia in other chronic diseases classified elsewhere: Secondary | ICD-10-CM

## 2017-07-31 LAB — COMPREHENSIVE METABOLIC PANEL
ALK PHOS: 61 U/L (ref 38–126)
ALT: <5 U/L — ABNORMAL LOW (ref 17–63)
ANION GAP: 6 (ref 5–15)
AST: 28 U/L (ref 15–41)
Albumin: 2.4 g/dL — ABNORMAL LOW (ref 3.5–5.0)
BILIRUBIN TOTAL: 1 mg/dL (ref 0.3–1.2)
BUN: 17 mg/dL (ref 6–20)
CALCIUM: 8.2 mg/dL — AB (ref 8.9–10.3)
CO2: 27 mmol/L (ref 22–32)
Chloride: 102 mmol/L (ref 101–111)
Creatinine, Ser: 0.7 mg/dL (ref 0.61–1.24)
GFR calc non Af Amer: >60 mL/min (ref >60)
Glucose, Bld: 93 mg/dL (ref 65–99)
Potassium: 3.7 mmol/L (ref 3.5–5.1)
SODIUM: 135 mmol/L (ref 135–145)
TOTAL PROTEIN: 5 g/dL — AB (ref 6.5–8.1)

## 2017-07-31 LAB — CBC WITH DIFFERENTIAL/PLATELET
Basophils Absolute: 0 10*3/uL (ref 0.0–0.1)
Basophils Relative: 0 %
EOS ABS: 0 10*3/uL (ref 0.0–0.7)
Eosinophils Relative: 0 %
HCT: 30.3 % — ABNORMAL LOW (ref 39.0–52.0)
HEMOGLOBIN: 9.5 g/dL — AB (ref 13.0–17.0)
LYMPHS ABS: 0.8 10*3/uL (ref 0.7–4.0)
Lymphocytes Relative: 7 %
MCH: 29.1 pg (ref 26.0–34.0)
MCHC: 31.4 g/dL (ref 30.0–36.0)
MCV: 92.9 fL (ref 78.0–100.0)
MONO ABS: 0.8 10*3/uL (ref 0.1–1.0)
MONOS PCT: 7 %
NEUTROS PCT: 86 %
Neutro Abs: 9.3 10*3/uL — ABNORMAL HIGH (ref 1.7–7.7)
Platelets: 209 10*3/uL (ref 150–400)
RBC: 3.26 MIL/uL — ABNORMAL LOW (ref 4.22–5.81)
RDW: 14.1 % (ref 11.5–15.5)
WBC: 10.9 10*3/uL — ABNORMAL HIGH (ref 4.0–10.5)

## 2017-07-31 LAB — GLUCOSE, CAPILLARY
GLUCOSE-CAPILLARY: 137 mg/dL — AB (ref 65–99)
GLUCOSE-CAPILLARY: 140 mg/dL — AB (ref 65–99)
Glucose-Capillary: 88 mg/dL (ref 65–99)

## 2017-07-31 MED ORDER — METHYLPREDNISOLONE SODIUM SUCC 40 MG IJ SOLR
40.0000 mg | Freq: Two times a day (BID) | INTRAMUSCULAR | Status: DC
  Administered 2017-07-31 – 2017-08-01 (×3): 40 mg via INTRAVENOUS
  Filled 2017-07-31 (×3): qty 1

## 2017-07-31 MED ORDER — LIDOCAINE-EPINEPHRINE (PF) 1 %-1:200000 IJ SOLN
0.0000 mL | Freq: Once | INTRAMUSCULAR | Status: DC | PRN
  Filled 2017-07-31 (×2): qty 30

## 2017-07-31 MED ORDER — LIDOCAINE HCL 2 % EX GEL
1.0000 "application " | Freq: Once | CUTANEOUS | Status: DC | PRN
  Filled 2017-07-31: qty 5

## 2017-07-31 MED ORDER — DEXTROSE 5 % IV SOLN
500.0000 mg | INTRAVENOUS | Status: DC
  Administered 2017-07-31 – 2017-08-01 (×2): 500 mg via INTRAVENOUS
  Filled 2017-07-31 (×3): qty 500

## 2017-07-31 MED ORDER — LIDOCAINE HCL 4 % EX SOLN
0.0000 mL | Freq: Once | CUTANEOUS | Status: DC | PRN
  Filled 2017-07-31: qty 50

## 2017-07-31 MED ORDER — HYDRALAZINE HCL 20 MG/ML IJ SOLN
10.0000 mg | Freq: Three times a day (TID) | INTRAMUSCULAR | Status: DC | PRN
  Administered 2017-08-01: 10 mg via INTRAVENOUS
  Filled 2017-07-31: qty 1

## 2017-07-31 MED ORDER — VITAL HIGH PROTEIN PO LIQD
1000.0000 mL | ORAL | Status: DC
  Administered 2017-07-31: 1000 mL
  Filled 2017-07-31 (×2): qty 1000

## 2017-07-31 MED ORDER — SILVER NITRATE-POT NITRATE 75-25 % EX MISC
1.0000 | Freq: Once | CUTANEOUS | Status: DC | PRN
  Filled 2017-07-31: qty 1

## 2017-07-31 MED ORDER — IPRATROPIUM-ALBUTEROL 0.5-2.5 (3) MG/3ML IN SOLN
3.0000 mL | RESPIRATORY_TRACT | Status: DC | PRN
  Administered 2017-07-31: 3 mL via RESPIRATORY_TRACT
  Filled 2017-07-31: qty 3

## 2017-07-31 MED ORDER — TRIPLE ANTIBIOTIC 3.5-400-5000 EX OINT
1.0000 "application " | TOPICAL_OINTMENT | Freq: Once | CUTANEOUS | Status: DC | PRN
  Filled 2017-07-31 (×2): qty 1

## 2017-07-31 MED ORDER — OXYMETAZOLINE HCL 0.05 % NA SOLN
1.0000 | Freq: Once | NASAL | Status: DC | PRN
  Filled 2017-07-31: qty 15

## 2017-07-31 MED ORDER — DEXTROSE-NACL 5-0.45 % IV SOLN
INTRAVENOUS | Status: DC
  Administered 2017-07-31 – 2017-08-01 (×2): via INTRAVENOUS

## 2017-07-31 MED ORDER — GUAIFENESIN 100 MG/5ML PO SOLN
10.0000 mL | ORAL | Status: DC
  Administered 2017-08-01 (×2): 200 mg via ORAL
  Filled 2017-07-31 (×2): qty 5

## 2017-07-31 NOTE — Plan of Care (Signed)
  Nutrition: Adequate nutrition will be maintained 07/31/2017 2112 - Progressing by Drenda Freeze, RN Note Tube feed started per MD order

## 2017-07-31 NOTE — Consult Note (Signed)
Gabriel Stewart, Gabriel Stewart 84 y.o., male [id number]     Chief Complaint: dysphagia  HPI: 84 yo wm, long hx parkinson's disease.  Long hx dysphagia.  Several recent hospitalizations for presumed aspiration.  Had been eating po.  Has a G tube.  No tube feedings at present.  SLP eval suggested dysphagia, aspiration, and a possible laryngeal mass.  No throat pain.  No neck masses. No subjective breathing difficulty thus far.  No hemoptysis.  Distant hx limited amt of smoking.  PMH: Past Medical History:  Diagnosis Date  . Cardiac arrest [facility]) 1970's   "did CPR & revived him"   . Dizzy spells    "frequent"  . Hypertension   . Kidney stone   . Lyme disease [phone]  . Melanoma ([place]) 2010   "back"  . Parkinson's disease ([place])   . Pneumonia    "as a kid"  . Syncope and collapse 11/24/2014    Surg Hx: Past Surgical History:  Procedure Laterality Date  . BLADDER STONE REMOVAL  2015  . CATARACT EXTRACTION W/ INTRAOCULAR LENS  IMPLANT, BILATERAL Bilateral 1970  . CIRCUMCISION  2015  . CYSTOSCOPY W/ STONE MANIPULATION     "never got the stone though"  . MELANOMA EXCISION  2010   "back"  . TEMPORAL ARTERY BIOPSY / LIGATION  4097-3532'D   "before dx'd w/Lyme's disease"    FHx:   Family History  Problem Relation Age of Onset  . Appendicitis Brother   . Pneumonia Brother    SocHx:  reports that he has quit smoking. His smoking use included cigarettes. He has a 5.00 pack-year smoking history. he has never used smokeless tobacco. He reports that he drinks about 4.2 oz of alcohol per week. He reports that he does not use drugs.  ALLERGIES:  Allergies  Allergen Reactions  . Novocain [Procaine]     Syncope, "passes out"    Medications Prior to Admission  Medication Sig Dispense Refill  . Amino Acids-Protein Hydrolys (FEEDING SUPPLEMENT, PRO-STAT SUGAR FREE 64,) LIQD Place 30 mLs into feeding tube daily.    Marland Kitchen amoxicillin-clavulanate (AUGMENTIN) 875-125 MG tablet Place 1 tablet into  feeding tube every 12 (twelve) hours. 4 tablet 0  . B Complex-C (SUPER B COMPLEX PO) Place 1 tablet into feeding tube daily.     Marland Kitchen ENSURE (ENSURE) Take 237 mLs by mouth 5 (five) times daily.     . ergocalciferol (VITAMIN D2) 50000 units capsule Take 50,000 Units by mouth once a week.    Marland Kitchen glycopyrrolate (ROBINUL) 1 MG tablet Take 1 tablet (1 mg total) 3 (three) times daily by mouth. (Patient taking differently: Place 1 mg into feeding tube at bedtime. ) 270 tablet 4  . predniSONE (DELTASONE) 5 MG tablet Place 1 tablet (5 mg total) into feeding tube daily with breakfast.    . carbidopa-levodopa (SINEMET IR) 25-100 MG tablet Take 2 tablets 3 (three) times daily by mouth. (Patient taking differently: Place 2 tablets into feeding tube 3 (three) times daily. ) 540 tablet 4  . vitamin B-12 (CYANOCOBALAMIN) 1000 MCG tablet Take 1,000 mcg by mouth daily.       Results for orders placed or performed during the hospital encounter of 07/27/2017 (from the past 48 hour(s))  Respiratory Panel by PCR     Status: None   Collection Time: 07/30/17  1:34 AM  Result Value Ref Range   Adenovirus NOT DETECTED NOT DETECTED   Coronavirus 229E NOT DETECTED NOT DETECTED   Coronavirus HKU1 NOT DETECTED  NOT DETECTED   Coronavirus NL63 NOT DETECTED NOT DETECTED   Coronavirus OC43 NOT DETECTED NOT DETECTED   Metapneumovirus NOT DETECTED NOT DETECTED   Rhinovirus / Enterovirus NOT DETECTED NOT DETECTED   Influenza A NOT DETECTED NOT DETECTED   Influenza B NOT DETECTED NOT DETECTED   Parainfluenza Virus 1 NOT DETECTED NOT DETECTED   Parainfluenza Virus 2 NOT DETECTED NOT DETECTED   Parainfluenza Virus 3 NOT DETECTED NOT DETECTED   Parainfluenza Virus 4 NOT DETECTED NOT DETECTED   Respiratory Syncytial Virus NOT DETECTED NOT DETECTED   Bordetella pertussis NOT DETECTED NOT DETECTED   Chlamydophila pneumoniae NOT DETECTED NOT DETECTED   Mycoplasma pneumoniae NOT DETECTED NOT DETECTED  Strep pneumoniae urinary antigen      Status: None   Collection Time: 07/30/17  3:29 AM  Result Value Ref Range   Strep Pneumo Urinary Antigen NEGATIVE NEGATIVE    Comment:        Infection due to S. pneumoniae cannot be absolutely ruled out since the antigen present may be below the detection limit of the test.   Urinalysis, Routine w reflex microscopic     Status: Abnormal   Collection Time: 07/30/17  3:31 AM  Result Value Ref Range   Color, Urine YELLOW YELLOW   APPearance HAZY (A) CLEAR   Specific Gravity, Urine 1.019 1.005 - 1.030   pH 5.0 5.0 - 8.0   Glucose, UA NEGATIVE NEGATIVE mg/dL   Hgb urine dipstick NEGATIVE NEGATIVE   Bilirubin Urine NEGATIVE NEGATIVE   Ketones, ur 5 (A) NEGATIVE mg/dL   Protein, ur 30 (A) NEGATIVE mg/dL   Nitrite NEGATIVE NEGATIVE   Leukocytes, UA NEGATIVE NEGATIVE   RBC / HPF 0-5 0 - 5 RBC/hpf   WBC, UA 0-5 0 - 5 WBC/hpf   Bacteria, UA NONE SEEN NONE SEEN   Squamous Epithelial / LPF 0-5 (A) NONE SEEN   Mucus PRESENT    Hyaline Casts, UA PRESENT   Comprehensive metabolic panel     Status: Abnormal   Collection Time: 07/30/17  4:01 AM  Result Value Ref Range   Sodium 136 135 - 145 mmol/L   Potassium 4.0 3.5 - 5.1 mmol/L   Chloride 105 101 - 111 mmol/L   CO2 25 22 - 32 mmol/L   Glucose, Bld 134 (H) 65 - 99 mg/dL   BUN 19 6 - 20 mg/dL   Creatinine, Ser 0.72 0.61 - 1.24 mg/dL   Calcium 8.3 (L) 8.9 - 10.3 mg/dL   Total Protein 5.0 (L) 6.5 - 8.1 g/dL   Albumin 2.5 (L) 3.5 - 5.0 g/dL   AST 21 15 - 41 U/L   ALT 6 (L) 17 - 63 U/L   Alkaline Phosphatase 63 38 - 126 U/L   Total Bilirubin 1.0 0.3 - 1.2 mg/dL   GFR calc non Af Amer >60 >60 mL/min   GFR calc Af Amer >60 >60 mL/min    Comment: (NOTE) The eGFR has been calculated using the CKD EPI equation. This calculation has not been validated in all clinical situations. eGFR's persistently <60 mL/min signify possible Chronic Kidney Disease.    Anion gap 6 5 - 15  Vancomycin, trough     Status: Abnormal   Collection Time:  07/30/17  9:57 AM  Result Value Ref Range   Vancomycin Tr 13 (L) 15 - 20 ug/mL  Glucose, capillary     Status: Abnormal   Collection Time: 07/30/17  8:49 PM  Result Value Ref Range  Glucose-Capillary 112 (H) 65 - 99 mg/dL  CBC with Differential/Platelet     Status: Abnormal   Collection Time: 07/31/17  2:07 AM  Result Value Ref Range   WBC 10.9 (H) 4.0 - 10.5 K/uL   RBC 3.26 (L) 4.22 - 5.81 MIL/uL   Hemoglobin 9.5 (L) 13.0 - 17.0 g/dL   HCT 30.3 (L) 39.0 - 52.0 %   MCV 92.9 78.0 - 100.0 fL   MCH 29.1 26.0 - 34.0 pg   MCHC 31.4 30.0 - 36.0 g/dL   RDW 14.1 11.5 - 15.5 %   Platelets 209 150 - 400 K/uL   Neutrophils Relative % 86 %   Neutro Abs 9.3 (H) 1.7 - 7.7 K/uL   Lymphocytes Relative 7 %   Lymphs Abs 0.8 0.7 - 4.0 K/uL   Monocytes Relative 7 %   Monocytes Absolute 0.8 0.1 - 1.0 K/uL   Eosinophils Relative 0 %   Eosinophils Absolute 0.0 0.0 - 0.7 K/uL   Basophils Relative 0 %   Basophils Absolute 0.0 0.0 - 0.1 K/uL  Comprehensive metabolic panel     Status: Abnormal   Collection Time: 07/31/17  2:07 AM  Result Value Ref Range   Sodium 135 135 - 145 mmol/L   Potassium 3.7 3.5 - 5.1 mmol/L   Chloride 102 101 - 111 mmol/L   CO2 27 22 - 32 mmol/L   Glucose, Bld 93 65 - 99 mg/dL   BUN 17 6 - 20 mg/dL   Creatinine, Ser 0.70 0.61 - 1.24 mg/dL   Calcium 8.2 (L) 8.9 - 10.3 mg/dL   Total Protein 5.0 (L) 6.5 - 8.1 g/dL   Albumin 2.4 (L) 3.5 - 5.0 g/dL   AST 28 15 - 41 U/L   ALT <5 (L) 17 - 63 U/L   Alkaline Phosphatase 61 38 - 126 U/L   Total Bilirubin 1.0 0.3 - 1.2 mg/dL   GFR calc non Af Amer >60 >60 mL/min   GFR calc Af Amer >60 >60 mL/min    Comment: (NOTE) The eGFR has been calculated using the CKD EPI equation. This calculation has not been validated in all clinical situations. eGFR's persistently <60 mL/min signify possible Chronic Kidney Disease.    Anion gap 6 5 - 15  Glucose, capillary     Status: None   Collection Time: 07/31/17  8:28 AM  Result Value Ref  Range   Glucose-Capillary 88 65 - 99 mg/dL  Glucose, capillary     Status: Abnormal   Collection Time: 07/31/17  4:07 PM  Result Value Ref Range   Glucose-Capillary 137 (H) 65 - 99 mg/dL   Comment 1 Notify RN    No results found.   Blood pressure (!) 155/76, pulse 77, temperature 97.7 F (36.5 C), temperature source Oral, resp. rate 19, height 6' (1.829 m), weight 77.5 kg (170 lb 13.7 oz), SpO2 97 %.  PHYSICAL EXAM: Overall appearance:  Old. Alert.  Cachectic.  Aphonic.  No stridor.  Dysfunctional cough with poor clearance of secretions.  Head:  NCAT Ears:  Wax AD Nose:  LEFT deviated nasal septum. Oral Cavity: dry with residual green dye from SLP eval.   Oral Pharynx/Hypopharynx/Larynx:  Per flexible laryngoscope, bulky submucosal mass of aryepiglottic fold RIGHT with vocal cord fixation and some exophytic appearing lesion of true cord.  Airway fair to good.   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  Will try to meet with the whole family tomorrow AM.  Airway will become an issue fairly soon and might require tracheostomy.  Treatment options would begin with CT neck and biopsy of this lesion.  Radiation may be an easier option than laryngectomy.  I am not sure at present that either he or his son favor's any aggressive intervention.    Jodi Marble 02/20/1030, 6:01 PM

## 2017-07-31 NOTE — Progress Notes (Signed)
PROGRESS NOTE  Gabriel Stewart CBJ:[id number] DOB: 1921-11-18 DOA: 08/13/2017 PCP: Leighton Ruff, MD  HPI/Recap of past 24 hours: 57 male end stage parkinsons since 2008[akinatic dysautonomia + dysphagia [has peg tube] AOCD COPD htn hyperglycemia, fall + sdh 2016, sialorrhea on botox  Today, pt noted to still have sob, with stridor/wheeezing. Denied any chest pain, abdominal pain.  Assessment/Plan: Principal Problem:   Acute on chronic respiratory failure with hypoxia (HCC) Active Problems:   COPD exacerbation (HCC)   Aspiration pneumonia (HCC)   Parkinson's disease (tremor, stiffness, slow motion, unstable posture) (HCC)   Dysphagia, neurologic   Anemia, chronic disease  Hypoxic resp failure  Aspiration and sepsis-2/2 to aspiration on feeds Keep NPO-Ice chips only. Hold tubefeeds for now SLP performed swallow eval: found a mass around the R arytenoid cartilage. ENT consulted, appreciate recs dc/ vanc and cefepime cont Robinul per tube for secretions Cont D5W/1/2 NS @ 75 cc/h  COPD Re-start Solu-medrol, duonebs  End stage parkinsons Sinemet via Tube 2 tab tid  Dysphagia Likely due to recent mass found  HTN Stable    Code Status: DNR  Family Communication: None at bedside  Disposition Plan:  Likely SNF   Consultants: ENT Palliative  Procedures:  None  Antimicrobials:  S/P IV Vanc + IV Cepefime  DVT prophylaxis: lovenox   Objective: Vitals:   07/31/17 0804 07/31/17 1336 07/31/17 1642 07/31/17 2039  BP:    (!) 143/65  Pulse:    83  Resp:    18  Temp:    98.1 F (36.7 C)  TempSrc:    Oral  SpO2: 97% 95% 97% 95%  Weight:      Height:        Intake/Output Summary (Last 24 hours) at 07/31/2017 2241 Last data filed at 07/31/2017 0700 Gross per 24 hour  Intake 900 ml  Output 600 ml  Net 300 ml   Filed Weights   07/29/17 1439  Weight: 77.5 kg (170 lb 13.7 oz)    Exam:   General:  Alert, awake, NAD, wheezing noted  Cardiovascular:  S1, S2 present, no added hrt sound  Respiratory: Coarse BS bilaterally  Abdomen: Soft, nontender, nondistended BS+   Musculoskeletal: No pedal edema  Skin: Normal  Psychiatry: Normal affect   Data Reviewed: CBC: Recent Labs  Lab 07/23/2017 1148 07/29/17 0318 07/31/17 0207  WBC 14.3* 10.3 10.9*  NEUTROABS 12.7* 9.8 9.3*  HGB 9.7* 9.9* 9.5*  HCT 30.6* 31.2* 30.3*  MCV 94.2 94.8 92.9  PLT 323 236 160   Basic Metabolic Panel: Recent Labs  Lab 08/10/2017 1148 07/29/17 0318 07/30/17 0401 07/31/17 0207  NA 135 137 136 135  K 3.9 4.4 4.0 3.7  CL 98* 107 105 102  CO2 27 24 25 27   GLUCOSE 176* 177* 134* 93  BUN 20 17 19 17   CREATININE 0.87 0.78 0.72 0.70  CALCIUM 8.7* 8.0* 8.3* 8.2*   GFR: Estimated Creatinine Clearance: 63.2 mL/min (by C-G formula based on SCr of 0.7 mg/dL). Liver Function Tests: Recent Labs  Lab 07/29/17 0318 07/30/17 0401 07/31/17 0207  AST 28 21 28   ALT 7* 6* <5*  ALKPHOS 59 63 61  BILITOT 0.9 1.0 1.0  PROT 5.0* 5.0* 5.0*  ALBUMIN 2.4* 2.5* 2.4*   No results for input(s): LIPASE, AMYLASE in the last 168 hours. No results for input(s): AMMONIA in the last 168 hours. Coagulation Profile: No results for input(s): INR, PROTIME in the last 168 hours. Cardiac Enzymes: No results for input(s):  CKTOTAL, CKMB, CKMBINDEX, TROPONINI in the last 168 hours. BNP (last 3 results) No results for input(s): PROBNP in the last 8760 hours. HbA1C: No results for input(s): HGBA1C in the last 72 hours. CBG: Recent Labs  Lab 07/25/17 0032 07/25/17 0752 07/30/17 2049 07/31/17 0828 07/31/17 1607  GLUCAP 90 90 112* 88 137*   Lipid Profile: No results for input(s): CHOL, HDL, LDLCALC, TRIG, CHOLHDL, LDLDIRECT in the last 72 hours. Thyroid Function Tests: No results for input(s): TSH, T4TOTAL, FREET4, T3FREE, THYROIDAB in the last 72 hours. Anemia Panel: No results for input(s): VITAMINB12, FOLATE, FERRITIN, TIBC, IRON, RETICCTPCT in the last 72  hours. Urine analysis:    Component Value Date/Time   COLORURINE YELLOW 07/30/2017 0331   APPEARANCEUR HAZY (A) 07/30/2017 0331   LABSPEC 1.019 07/30/2017 0331   PHURINE 5.0 07/30/2017 0331   GLUCOSEU NEGATIVE 07/30/2017 0331   HGBUR NEGATIVE 07/30/2017 0331   BILIRUBINUR NEGATIVE 07/30/2017 0331   KETONESUR 5 (A) 07/30/2017 0331   PROTEINUR 30 (A) 07/30/2017 0331   NITRITE NEGATIVE 07/30/2017 0331   LEUKOCYTESUR NEGATIVE 07/30/2017 0331   Sepsis Labs: @LABRCNTIP (procalcitonin:4,lacticidven:4)  ) Recent Results (from the past 240 hour(s))  Culture, expectorated sputum-assessment     Status: None   Collection Time: 07/22/17  8:14 PM  Result Value Ref Range Status   Specimen Description SPUTUM  Final   Special Requests NONE  Final   Sputum evaluation THIS SPECIMEN IS ACCEPTABLE FOR SPUTUM CULTURE  Final   Report Status 07/27/2017 FINAL  Final  Culture, blood (routine x 2)     Status: None (Preliminary result)   Collection Time: 08/22/2017 11:25 AM  Result Value Ref Range Status   Specimen Description BLOOD LEFT ANTECUBITAL  Final   Special Requests   Final    BOTTLES DRAWN AEROBIC AND ANAEROBIC Blood Culture adequate volume   Culture NO GROWTH 3 DAYS  Final   Report Status PENDING  Incomplete  Culture, blood (routine x 2)     Status: None (Preliminary result)   Collection Time: 07/27/2017 11:55 AM  Result Value Ref Range Status   Specimen Description BLOOD RIGHT ANTECUBITAL  Final   Special Requests IN PEDIATRIC BOTTLE Blood Culture adequate volume  Final   Culture NO GROWTH 3 DAYS  Final   Report Status PENDING  Incomplete  Respiratory Panel by PCR     Status: None   Collection Time: 07/30/17  1:34 AM  Result Value Ref Range Status   Adenovirus NOT DETECTED NOT DETECTED Final   Coronavirus 229E NOT DETECTED NOT DETECTED Final   Coronavirus HKU1 NOT DETECTED NOT DETECTED Final   Coronavirus NL63 NOT DETECTED NOT DETECTED Final   Coronavirus OC43 NOT DETECTED NOT DETECTED  Final   Metapneumovirus NOT DETECTED NOT DETECTED Final   Rhinovirus / Enterovirus NOT DETECTED NOT DETECTED Final   Influenza A NOT DETECTED NOT DETECTED Final   Influenza B NOT DETECTED NOT DETECTED Final   Parainfluenza Virus 1 NOT DETECTED NOT DETECTED Final   Parainfluenza Virus 2 NOT DETECTED NOT DETECTED Final   Parainfluenza Virus 3 NOT DETECTED NOT DETECTED Final   Parainfluenza Virus 4 NOT DETECTED NOT DETECTED Final   Respiratory Syncytial Virus NOT DETECTED NOT DETECTED Final   Bordetella pertussis NOT DETECTED NOT DETECTED Final   Chlamydophila pneumoniae NOT DETECTED NOT DETECTED Final   Mycoplasma pneumoniae NOT DETECTED NOT DETECTED Final      Studies: No results found.  Scheduled Meds: . carbidopa-levodopa  2 tablet Per Tube TID  .  enoxaparin (LOVENOX) injection  40 mg Subcutaneous Q24H  . feeding supplement (VITAL HIGH PROTEIN)  1,000 mL Per Tube Q24H  . glycopyrrolate  1 mg Per Tube QHS  . guaiFENesin  10 mL Oral Q4H  . ipratropium-albuterol  3 mL Nebulization Q4H  . mouth rinse  15 mL Mouth Rinse BID  . methylPREDNISolone (SOLU-MEDROL) injection  40 mg Intravenous Q12H  . scopolamine  1 patch Transdermal Q72H    Continuous Infusions: . azithromycin Stopped (07/31/17 1345)  . dextrose 5 % and 0.45% NaCl 75 mL/hr at 07/31/17 1038     LOS: 3 days     Alma Friendly, MD [facility]   If 7PM-7AM, please contact night-coverage [email or web address] Password [email or web address] 07/31/2017, 10:41 PM

## 2017-07-31 NOTE — Consult Note (Signed)
Consultation Note Date: 07/31/2017   Patient Name: Gabriel Stewart  DOB: January 10, 1922  MRN: [id number]  Age / Sex: 84 y.o., male  PCP: Leighton Ruff, MD Referring Physician: Alma Friendly, MD  Reason for Consultation: Establishing goals of care and Psychosocial/spiritual support  HPI/Patient Profile: 84 y.o. male   admitted on 07/27/2017 with medical history significant for end-stage Parkinson's disease with associated dysphagia, anemia of chronic disease and COPD.    The patient was just discharged on 1/3 after an admission for operatory distress context of aspiration pneumonitis and bronchitis.  Was hospitalized for 6 days,  due to deconditioning and recommendations from PT/OT discharge to SNF for rehabilitative care.   Discharge plan was for patient to remain n.p.o. and to utilize PEG tube for all nutrition and hydration needs.  Only understood patient was high risk for aspiration    Peg tube had been placed 2 years ago and was not being used until this current admission.  Family understood that regardless of her n.p.o. status he was high risk for aspiration and high risk for decompensation secondary to end-stage Parkinson's disease  Patient was transferred to the emergency room from facility/[facility] for altered mental status and increased work of breathing  His room air saturations were 70%.  Upon arrival he was placed on BiPAP with improvement in work of breathing and oxygenation.   Admitted for treatment and stabilization.   Family once again face advance directive decisions, anticipatory care needs and concept of human mortality within the context of limitations of medical intervention with ES disease  Clinical Assessment and Goals of Care:  This NP Wadie Lessen reviewed medical records, received report from team, assessed the patient and then meet at the patient's bedside along with his  wife and son/[name]  to discuss diagnosis, prognosis, GOC, EOL wishes disposition and options.  I was the  palliative medicine provider who  met with this patient and family on previous admission.  At that time a detailed discussion was had regarding the natural trajectory and expectations of end-stage Parkinson's disease.  Wife verbalized understanding but was in favor of attempting short-term rehab in hopes of her husband returning home "a little bit stronger"  Concept of Hospice and Palliative Care were discussed  A  discussion was had again  today regarding advanced directives.  Concepts specific to code status, artifical feeding and hydration, continued IV antibiotics and rehospitalization was had.  The difference between a aggressive medical intervention path  and a palliative comfort care path for this patient at this time was had.  Values and goals of care important to patient and family were attempted to be elicited.  MOST form introduced completed, comfort is a main priority.  See hard chart  Natural trajectory and expectations at EOL were discussed.  Questions and concerns addressed.   Family encouraged to call with questions or concerns.    PMT will continue to support holistically.  I was with the patient and his son,  nursing informed them and myself  that  orders have been written for a neck CT and ENT consult.  I did review speech therapy note findings for concern for a large focus of red mass of tissue around the area of his arytenoid true vocal cord.   Shared with family  Spoke with the attending and she verified that she has been involved ENT care plan.  Family and patient  plan to speak with ENT consult and benefits of these procedures.  Discussed with patient and family  the importance of continued  conversation with their  medical providers regarding overall plan of care and treatment options,  ensuring decisions are within the context of the patients values and  GOCs.   HCPOA/ wife    SUMMARY OF RECOMMENDATIONS    Code Status/Advance Care Planning:  DNR   NO BiPap   Symptom Management:   Dysphasia: Patient to remain n.p.o. except for ice chips.  Stressed with family the importance of good mouth care prior to offering ice chips.  Initiate tube feeds for nutrition and hydration  Palliative Prophylaxis:   Aspiration, Bowel Regimen, Delirium Protocol, Frequent Pain Assessment and Oral Care  Additional Recommendations (Limitations, Scope, Preferences):  Avoid Hospitalization  Psycho-social/Spiritual:   Desire for further Chaplaincy support:no  Additional Recommendations: Education on Hospice  Prognosis:   < 6 months, clearly decision for life prolonging interventions will affect prognosis  Discharge Planning:  Wife continues to verbalize desire to utilize all rehab days/SNF before decision to take her husband home with hospice   To Be Determined      Primary Diagnoses: Present on Admission: . COPD exacerbation ([place]) . Acute respiratory failure with hypoxia ([place]) . Aspiration pneumonia ([place]) . Parkinson's disease (tremor, stiffness, slow motion, unstable posture) ([place]) . Dysphagia, neurologic . Anemia, chronic disease   I have reviewed the medical record, interviewed the patient and family, and examined the patient. The following aspects are pertinent.  Past Medical History:  Diagnosis Date  . Cardiac arrest [facility]) 1970's   "did CPR & revived him"   . Dizzy spells    "frequent"  . Hypertension   . Kidney stone   . Lyme disease [phone]  . Melanoma ([place]) 2010   "back"  . Parkinson's disease ([place])   . Pneumonia    "as a kid"  . Syncope and collapse 11/24/2014   Social History   Socioeconomic History  . Marital status: Married    Spouse name: [name]  . Number of children: 3  . Years of education: 46  . Highest education level: None  Social Needs  . Financial resource strain: None  . Food insecurity -  worry: None  . Food insecurity - inability: None  . Transportation needs - medical: None  . Transportation needs - non-medical: None  Occupational History    Comment: retired  Tobacco Use  . Smoking status: Former Smoker    Packs/day: 1.00    Years: 5.00    Pack years: 5.00    Types: Cigarettes  . Smokeless tobacco: Never Used  . Tobacco comment: quit in 1960  Substance and Sexual Activity  . Alcohol use: Yes    Alcohol/week: 4.2 oz    Types: 7 Shots of liquor per week    Comment: 1 shots bourbon daily  . Drug use: No  . Sexual activity: Not Currently  Other Topics Concern  . None  Social History Narrative   Patient resides with wife, can write with hands   Family History  Problem Relation Age of Onset  .  Appendicitis Brother   . Pneumonia Brother    Scheduled Meds: . carbidopa-levodopa  2 tablet Per Tube TID  . enoxaparin (LOVENOX) injection  40 mg Subcutaneous Q24H  . glycopyrrolate  1 mg Per Tube QHS  . guaiFENesin  600 mg Oral BID  . ipratropium-albuterol  3 mL Nebulization Q4H  . mouth rinse  15 mL Mouth Rinse BID  . methylPREDNISolone (SOLU-MEDROL) injection  40 mg Intravenous Q12H  . scopolamine  1 patch Transdermal Q72H   Continuous Infusions: . azithromycin 500 mg (07/31/17 1232)  . dextrose 5 % and 0.45% NaCl 75 mL/hr at 07/31/17 1038   PRN Meds:.hydrALAZINE, ipratropium-albuterol Medications Prior to Admission:  Prior to Admission medications   Medication Sig Start Date End Date Taking? Authorizing Provider  Amino Acids-Protein Hydrolys (FEEDING SUPPLEMENT, PRO-STAT SUGAR FREE 64,) LIQD Place 30 mLs into feeding tube daily.   Yes [provider]  amoxicillin-clavulanate (AUGMENTIN) 875-125 MG tablet Place 1 tablet into feeding tube every 12 (twelve) hours. 07/25/17  Yes Donne Hazel, MD  B Complex-C (SUPER B COMPLEX PO) Place 1 tablet into feeding tube daily.    Yes [provider]  ENSURE (ENSURE) Take 237 mLs by mouth 5 (five) times  daily.    Yes [provider]  ergocalciferol (VITAMIN D2) 50000 units capsule Take 50,000 Units by mouth once a week.   Yes [provider]  glycopyrrolate (ROBINUL) 1 MG tablet Take 1 tablet (1 mg total) 3 (three) times daily by mouth. Patient taking differently: Place 1 mg into feeding tube at bedtime.  06/05/17  Yes Penumalli, Earlean Polka, MD  predniSONE (DELTASONE) 5 MG tablet Place 1 tablet (5 mg total) into feeding tube daily with breakfast. 07/25/17  Yes Donne Hazel, MD  carbidopa-levodopa (SINEMET IR) 25-100 MG tablet Take 2 tablets 3 (three) times daily by mouth. Patient taking differently: Place 2 tablets into feeding tube 3 (three) times daily.  06/05/17   Penumalli, Earlean Polka, MD  vitamin B-12 (CYANOCOBALAMIN) 1000 MCG tablet Take 1,000 mcg by mouth daily.     [provider]   Allergies  Allergen Reactions  . Novocain [Procaine]     Syncope, "passes out"   Review of Systems  HENT: Positive for trouble swallowing.   Respiratory: Positive for cough.     Physical Exam  Constitutional: He appears cachectic. He appears ill.  -Frail elderly white male  HENT:  Audible throat secretions too weak/cough to clear  Pulmonary/Chest: He has decreased breath sounds in the right lower field and the left lower field.    Vital Signs: BP (!) 155/76 (BP Location: Right Arm)   Pulse 77   Temp 97.7 F (36.5 C) (Oral)   Resp 19   Ht 6' (1.829 m)   Wt 77.5 kg (170 lb 13.7 oz)   SpO2 97%   BMI 23.17 kg/m  Pain Assessment: No/denies pain   Pain Score: 0-No pain   SpO2: SpO2: 97 % O2 Device:SpO2: 97 % O2 Flow Rate: .O2 Flow Rate (L/min): 3 L/min  IO: Intake/output summary:   Intake/Output Summary (Last 24 hours) at 07/31/2017 1326 Last data filed at 07/31/2017 0700 Gross per 24 hour  Intake 900 ml  Output 1050 ml  Net -150 ml    LBM:   Baseline Weight: Weight: 77.5 kg (170 lb 13.7 oz) Most recent weight: Weight: 77.5 kg (170 lb 13.7 oz)      Palliative Assessment/Data: 30 % at best   Discussed with Dr Horris Latino  Time In: 1400 Time Out: 1530 Time Total: 90 minutes Greater than 50%  of this time was spent counseling and coordinating care related to the above assessment and plan.  Signed by: Wadie Lessen, NP   Please contact Palliative Medicine Team phone at [phone] for questions and concerns.  For individual provider: See Shea Evans

## 2017-07-31 NOTE — Procedures (Addendum)
Objective Swallowing Evaluation: Type of Study: FEES-Fiberoptic Endoscopic Evaluation of Swallow   Patient Details  Name: Gabriel Stewart MRN: [id number] Date of Birth: 1921/11/25  Today's Date: 07/31/2017 Time: SLP Start Time (ACUTE ONLY): 1145 -SLP Stop Time (ACUTE ONLY): 1208  SLP Time Calculation (min) (ACUTE ONLY): 23 min   Past Medical History:  Past Medical History:  Diagnosis Date  . Cardiac arrest [facility]) 1970's   "did CPR & revived him"   . Dizzy spells    "frequent"  . Hypertension   . Kidney stone   . Lyme disease [phone]  . Melanoma ([place]) 2010   "back"  . Parkinson's disease ([place])   . Pneumonia    "as a kid"  . Syncope and collapse 11/24/2014   Past Surgical History:  Past Surgical History:  Procedure Laterality Date  . BLADDER STONE REMOVAL  2015  . CATARACT EXTRACTION W/ INTRAOCULAR LENS  IMPLANT, BILATERAL Bilateral 1970  . CIRCUMCISION  2015  . CYSTOSCOPY W/ STONE MANIPULATION     "never got the stone though"  . MELANOMA EXCISION  2010   "back"  . TEMPORAL ARTERY BIOPSY / LIGATION  [id number]'Y   "before dx'd w/Lyme's disease"   HPI: Gabriel Stewart is a 84 y.o. male with medical history significant for end-stage Parkinson's disease with associated dysphagia, anemia of chronic disease and COPD. Recent n 1/3 after an admission for respiratory distress due to aspiration pneumonitis and bronchitis. MBS 07/20/17 recommended Dys 2, nectar. Pt currently has a PEG. MD documentation to keep NPO after MD conversation with wife not wanting comfort feeds. Speech tx session with wife/pt confirmed desire for NPO status and initiated water protocol. Readmitted and FEES ordered given dysphonia. CXR 1/7/ minimal left basilar atelectasis.   Subjective: pleasant, able to answer questions but with very low vocal intensity    Assessment / Plan / Recommendation  CHL IP CLINICAL IMPRESSIONS 07/31/2017  Clinical Impression Pt continues to exhibit mod-severe pharyngeal  dysphagia marked by laryngeal penetration and aspiration (silent) with nectar thick liquids. Verbal cues to cough expell a portion but unable to discern aspirates remaining in trachea. Concern for presence of large, bulbous, red mass of tissue around the area of his right arytenoid cartilidge obstructing the true vocal cord. Penetration to vocal cord level with puree. Following MBS during last visit pt consumed ice chips. Pt has PEG for several years and in discussing eating/drinking with the pt he states he doesn't have a desire to eat or drink, although he would like to continue ice chips. Wife states prior to this admission he was cleared for puree but eats one or two bites due to combination of choking/little desire. Discussed/showed picture of suspicious are in larynx to MD who states she will consult ENT. Comfort feeds could be discussed but as mentioned earlier, pt doesn't seem to exhibit much interest.       SLP Visit Diagnosis Dysphagia, pharyngeal phase (R13.13)  Attention and concentration deficit following --  Frontal lobe and executive function deficit following --  Impact on safety and function Severe aspiration risk      CHL IP TREATMENT RECOMMENDATION 07/31/2017  Treatment Recommendations Therapy as outlined in treatment plan below     Prognosis 07/31/2017  Prognosis for Safe Diet Advancement Guarded  Barriers to Reach Goals Severity of deficits  Barriers/Prognosis Comment --    CHL IP DIET RECOMMENDATION 07/31/2017  SLP Diet Recommendations NPO  Liquid Administration via --  Medication Administration Via alternative means  Compensations --  Postural Changes --      CHL IP OTHER RECOMMENDATIONS 07/31/2017  Recommended Consults Consider ENT evaluation  Oral Care Recommendations Oral care QID  Other Recommendations --      CHL IP FOLLOW UP RECOMMENDATIONS 07/31/2017  Follow up Recommendations 24 hour supervision/assistance      CHL IP FREQUENCY AND DURATION 07/31/2017  Speech  Therapy Frequency (ACUTE ONLY) min 1 x/week  Treatment Duration 2 weeks           CHL IP ORAL PHASE 07/31/2017  Oral Phase WFL  Oral - Pudding Teaspoon --  Oral - Pudding Cup --  Oral - Honey Teaspoon --  Oral - Honey Cup WFL  Oral - Nectar Teaspoon --  Oral - Nectar Cup --  Oral - Nectar Straw --  Oral - Thin Teaspoon NT  Oral - Thin Cup --  Oral - Thin Straw --  Oral - Puree WFL  Oral - Mech Soft --  Oral - Regular --  Oral - Multi-Consistency --  Oral - Pill --  Oral Phase - Comment --    CHL IP PHARYNGEAL PHASE 07/31/2017  Pharyngeal Phase Impaired  Pharyngeal- Pudding Teaspoon --  Pharyngeal --  Pharyngeal- Pudding Cup --  Pharyngeal --  Pharyngeal- Honey Teaspoon --  Pharyngeal --  Pharyngeal- Honey Cup Pharyngeal residue - valleculae;Pharyngeal residue - pyriform  Pharyngeal --  Pharyngeal- Nectar Teaspoon --  Pharyngeal --  Pharyngeal- Nectar Cup Penetration/Aspiration during swallow;Pharyngeal residue - valleculae;Pharyngeal residue - pyriform  Pharyngeal Material enters airway, passes BELOW cords without attempt by patient to eject out (silent aspiration)  Pharyngeal- Nectar Straw --  Pharyngeal --  Pharyngeal- Thin Teaspoon NT  Pharyngeal --  Pharyngeal- Thin Cup NT  Pharyngeal --  Pharyngeal- Thin Straw --  Pharyngeal --  Pharyngeal- Puree Pharyngeal residue - valleculae;Pharyngeal residue - pyriform;Penetration/Aspiration during swallow  Pharyngeal Material enters airway, CONTACTS cords and not ejected out  Pharyngeal- Mechanical Soft NT  Pharyngeal --  Pharyngeal- Regular --  Pharyngeal --  Pharyngeal- Multi-consistency --  Pharyngeal --  Pharyngeal- Pill --  Pharyngeal --  Pharyngeal Comment --     CHL IP CERVICAL ESOPHAGEAL PHASE 07/31/2017  Cervical Esophageal Phase WFL  Pudding Teaspoon --  Pudding Cup --  Honey Teaspoon --  Honey Cup --  Nectar Teaspoon --  Nectar Cup --  Nectar Straw --  Thin Teaspoon --  Thin Cup --  Thin Straw --   Puree --  Mechanical Soft --  Regular --  Multi-consistency --  Pill --  Cervical Esophageal Comment --    CHL IP GO 05/27/2016  Functional Assessment Tool Used clinical judgment  Functional Limitations Swallowing  Swallow Current Status (H0865) CI  Swallow Goal Status (H8469) CI  Swallow Discharge Status (G2952) CI  Motor Speech Current Status (W4132) (None)  Motor Speech Goal Status (G4010) (None)  Motor Speech Goal Status (U7253) (None)  Spoken Language Comprehension Current Status (G6440) (None)  Spoken Language Comprehension Goal Status (H4742) (None)  Spoken Language Comprehension Discharge Status (V9563) (None)  Spoken Language Expression Current Status (O7564) (None)  Spoken Language Expression Goal Status (P3295) (None)  Spoken Language Expression Discharge Status (J8841) (None)  Attention Current Status (Y6063) (None)  Attention Goal Status (K1601) (None)  Attention Discharge Status (U9323) (None)  Memory Current Status (F5732) (None)  Memory Goal Status (K0254) (None)  Memory Discharge Status (Y7062) (None)  Voice Current Status (B7628) (None)  Voice Goal Status (B1517) (None)  Voice Discharge Status (O1607) (None)  Other Speech-Language  Pathology Functional Limitation Current Status [phone]) (None)  Other Speech-Language Pathology Functional Limitation Goal Status (E1007) (None)  Other Speech-Language Pathology Functional Limitation Discharge Status [phone]) (None)    Houston Siren 07/31/2017, 2:39 PM Orbie Pyo Colvin Caroli.[employer] [phone]

## 2017-08-01 DIAGNOSIS — J387 Other diseases of larynx: Secondary | ICD-10-CM

## 2017-08-01 LAB — GLUCOSE, CAPILLARY
GLUCOSE-CAPILLARY: 141 mg/dL — AB (ref 65–99)
Glucose-Capillary: 152 mg/dL — ABNORMAL HIGH (ref 65–99)
Glucose-Capillary: 150 mg/dL — ABNORMAL HIGH (ref 65–99)

## 2017-08-01 MED ORDER — ENSURE ENLIVE PO LIQD
237.0000 mL | Freq: Two times a day (BID) | ORAL | Status: DC
  Administered 2017-08-01 (×2): 237 mL via ORAL

## 2017-08-01 MED ORDER — IPRATROPIUM-ALBUTEROL 0.5-2.5 (3) MG/3ML IN SOLN
3.0000 mL | Freq: Four times a day (QID) | RESPIRATORY_TRACT | Status: DC
  Administered 2017-08-01 – 2017-08-02 (×4): 3 mL via RESPIRATORY_TRACT
  Filled 2017-08-01 (×5): qty 3

## 2017-08-01 MED ORDER — JEVITY 1.2 CAL PO LIQD
1000.0000 mL | ORAL | Status: DC
  Filled 2017-08-01 (×2): qty 1000

## 2017-08-01 MED ORDER — MORPHINE SULFATE (CONCENTRATE) 10 MG/0.5ML PO SOLN
5.0000 mg | ORAL | Status: DC | PRN
  Administered 2017-08-02: 5 mg via ORAL
  Filled 2017-08-01: qty 0.5

## 2017-08-01 MED ORDER — PRO-STAT SUGAR FREE PO LIQD
30.0000 mL | Freq: Every day | ORAL | Status: DC
  Administered 2017-08-01: 30 mL
  Filled 2017-08-01: qty 30

## 2017-08-01 MED ORDER — LORAZEPAM 0.5 MG PO TABS
0.5000 mg | ORAL_TABLET | Freq: Four times a day (QID) | ORAL | Status: DC | PRN
  Administered 2017-08-01: 0.5 mg via ORAL
  Filled 2017-08-01: qty 1

## 2017-08-01 NOTE — Progress Notes (Signed)
Gastric residual 0.

## 2017-08-01 NOTE — Progress Notes (Signed)
PROGRESS NOTE  Gabriel Stewart SKA:[id number] DOB: 1922-05-01 DOA: 08/18/2017 PCP: Leighton Ruff, MD  HPI/Recap of past 24 hours: 71 male end stage parkinsons since 2008[akinatic dysautonomia + dysphagia [has peg tube] AOCD COPD htn hyperglycemia, fall + sdh 2016, sialorrhea on botox  Today, pt noted to still have sob, with wheezing. Denied any chest pain, abdominal pain. Pt currently transitioned to hospice.  Assessment/Plan: Principal Problem:   Acute on chronic respiratory failure with hypoxia (HCC) Active Problems:   COPD exacerbation (HCC)   Aspiration pneumonia (HCC)   Parkinson's disease (tremor, stiffness, slow motion, unstable posture) (HCC)   Dysphagia, neurologic   Anemia, chronic disease   Mass of larynx  Acute hypoxic resp failure  Likely due to a mass in the R transglottic larynx R/O Aspiration and sepsis-2/2 to aspiration on feeds Keep NPO, no tubefeeds for now SLP performed swallow eval: found a mass around the R arytenoid cartilage. ENT consulted, appreciate recs dc/ vanc and cefepime cont Robinul for secretions Patient currently transitioned to hospice. 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 Transitioned to home hospice  COPD duonebs  End stage parkinsons Sinemet via Tube 2 tab tid  Dysphagia Likely due to recent mass found, no intervention as mentioned above  HTN Stable    Code Status: DNR  Family Communication: None at bedside  Disposition Plan:  Home hospice   Consultants: ENT Palliative  Procedures:  None  Antimicrobials:  S/P IV Vanc + IV Cepefime  DVT prophylaxis: None   Objective: Vitals:   08/01/17 0813 08/01/17 0829 08/01/17 1320 08/01/17 1445  BP:  (!) 164/83 (!) 143/84   Pulse: 95 (!) 105 (!) 106   Resp: 16 18 20    Temp:  97.7 F (36.5 C) 97.7 F (36.5 C)   TempSrc:  Oral Oral   SpO2: 95% 97% 94%  94%  Weight:      Height:        Intake/Output Summary (Last 24 hours) at 08/01/2017 1648 Last data filed at 08/01/2017 1300 Gross per 24 hour  Intake 1842.5 ml  Output 890 ml  Net 952.5 ml   Filed Weights   07/29/17 1439 08/01/17 0358  Weight: 77.5 kg (170 lb 13.7 oz) 72.6 kg (160 lb)    Exam:   General:  Alert, awake, NAD, wheezing noted  Cardiovascular: S1, S2 present, no added hrt sound  Respiratory: Coarse BS bilaterally  Abdomen: Soft, nontender, nondistended BS+   Musculoskeletal: No pedal edema  Skin: Normal  Psychiatry: Normal affect   Data Reviewed: CBC: Recent Labs  Lab 07/26/2017 1148 07/29/17 0318 07/31/17 0207  WBC 14.3* 10.3 10.9*  NEUTROABS 12.7* 9.8 9.3*  HGB 9.7* 9.9* 9.5*  HCT 30.6* 31.2* 30.3*  MCV 94.2 94.8 92.9  PLT 323 236 203   Basic Metabolic Panel: Recent Labs  Lab 08/17/2017 1148 07/29/17 0318 07/30/17 0401 07/31/17 0207  NA 135 137 136 135  K 3.9 4.4 4.0 3.7  CL 98* 107 105 102  CO2 27 24 25 27   GLUCOSE 176* 177* 134* 93  BUN 20 17 19 17   CREATININE 0.87 0.78 0.72 0.70  CALCIUM 8.7* 8.0* 8.3* 8.2*   GFR: Estimated Creatinine Clearance: 59.2 mL/min (by C-G formula based on SCr of 0.7 mg/dL). Liver Function Tests: Recent Labs  Lab 07/29/17 0318 07/30/17 0401 07/31/17 0207  AST 28 21 28   ALT 7* 6* <5*  ALKPHOS 59 63 61  BILITOT 0.9 1.0 1.0  PROT 5.0* 5.0* 5.0*  ALBUMIN 2.4* 2.5* 2.4*   No results for input(s): LIPASE, AMYLASE in the last 168 hours. No results for input(s): AMMONIA in the last 168 hours. Coagulation Profile: No results for input(s): INR, PROTIME in the last 168 hours. Cardiac Enzymes: No results for input(s): CKTOTAL, CKMB, CKMBINDEX, TROPONINI in the last 168 hours. BNP (last 3 results) No results for input(s): PROBNP in the last 8760 hours. HbA1C: No results for input(s): HGBA1C in the last 72 hours. CBG: Recent Labs  Lab 07/31/17 1607 08/01/17 0003 08/01/17 0358 08/01/17 0822  08/01/17 1138  GLUCAP 137* 140* 152* 150* 141*   Lipid Profile: No results for input(s): CHOL, HDL, LDLCALC, TRIG, CHOLHDL, LDLDIRECT in the last 72 hours. Thyroid Function Tests: No results for input(s): TSH, T4TOTAL, FREET4, T3FREE, THYROIDAB in the last 72 hours. Anemia Panel: No results for input(s): VITAMINB12, FOLATE, FERRITIN, TIBC, IRON, RETICCTPCT in the last 72 hours. Urine analysis:    Component Value Date/Time   COLORURINE YELLOW 07/30/2017 0331   APPEARANCEUR HAZY (A) 07/30/2017 0331   LABSPEC 1.019 07/30/2017 0331   PHURINE 5.0 07/30/2017 0331   GLUCOSEU NEGATIVE 07/30/2017 0331   HGBUR NEGATIVE 07/30/2017 0331   BILIRUBINUR NEGATIVE 07/30/2017 0331   KETONESUR 5 (A) 07/30/2017 0331   PROTEINUR 30 (A) 07/30/2017 0331   NITRITE NEGATIVE 07/30/2017 0331   LEUKOCYTESUR NEGATIVE 07/30/2017 0331   Sepsis Labs: @LABRCNTIP (procalcitonin:4,lacticidven:4)  ) Recent Results (from the past 240 hour(s))  Culture, expectorated sputum-assessment     Status: None   Collection Time: 07/22/17  8:14 PM  Result Value Ref Range Status   Specimen Description SPUTUM  Final   Special Requests NONE  Final   Sputum evaluation THIS SPECIMEN IS ACCEPTABLE FOR SPUTUM CULTURE  Final   Report Status 07/27/2017 FINAL  Final  Culture, blood (routine x 2)     Status: None (Preliminary result)   Collection Time: 08/01/2017 11:25 AM  Result Value Ref Range Status   Specimen Description BLOOD LEFT ANTECUBITAL  Final   Special Requests   Final    BOTTLES DRAWN AEROBIC AND ANAEROBIC Blood Culture adequate volume   Culture NO GROWTH 4 DAYS  Final   Report Status PENDING  Incomplete  Culture, blood (routine x 2)     Status: None (Preliminary result)   Collection Time: 08/05/2017 11:55 AM  Result Value Ref Range Status   Specimen Description BLOOD RIGHT ANTECUBITAL  Final   Special Requests IN PEDIATRIC BOTTLE Blood Culture adequate volume  Final   Culture NO GROWTH 4 DAYS  Final   Report Status  PENDING  Incomplete  Respiratory Panel by PCR     Status: None   Collection Time: 07/30/17  1:34 AM  Result Value Ref Range Status   Adenovirus NOT DETECTED NOT DETECTED Final   Coronavirus 229E NOT DETECTED NOT DETECTED Final   Coronavirus HKU1 NOT DETECTED NOT DETECTED Final   Coronavirus NL63 NOT DETECTED NOT DETECTED Final   Coronavirus OC43 NOT DETECTED NOT DETECTED Final   Metapneumovirus NOT DETECTED NOT DETECTED Final   Rhinovirus / Enterovirus NOT DETECTED NOT DETECTED Final   Influenza A NOT DETECTED NOT DETECTED Final   Influenza B NOT DETECTED NOT DETECTED Final   Parainfluenza Virus 1 NOT DETECTED NOT DETECTED Final   Parainfluenza Virus 2 NOT DETECTED NOT DETECTED Final   Parainfluenza Virus 3 NOT DETECTED NOT DETECTED Final   Parainfluenza Virus 4 NOT DETECTED  NOT DETECTED Final   Respiratory Syncytial Virus NOT DETECTED NOT DETECTED Final   Bordetella pertussis NOT DETECTED NOT DETECTED Final   Chlamydophila pneumoniae NOT DETECTED NOT DETECTED Final   Mycoplasma pneumoniae NOT DETECTED NOT DETECTED Final      Studies: No results found.  Scheduled Meds: . carbidopa-levodopa  2 tablet Per Tube TID  . feeding supplement (ENSURE ENLIVE)  237 mL Oral BID BM  . glycopyrrolate  1 mg Per Tube QHS  . ipratropium-albuterol  3 mL Nebulization Q6H  . mouth rinse  15 mL Mouth Rinse BID  . scopolamine  1 patch Transdermal Q72H    Continuous Infusions: . dextrose 5 % and 0.45% NaCl 75 mL/hr at 08/01/17 0239     LOS: 4 days     Alma Friendly, MD [facility]   If 7PM-7AM, please contact night-coverage [email or web address] Password [facility] 08/01/2017, 4:48 PM

## 2017-08-01 NOTE — Progress Notes (Addendum)
Hospice and [facility]) Hospital Liaison:  RN visit  Notified by Jari Favre, of patient/family request for [facility] services at home after discharge.  Chart and patient information reviewed by [facility] physician.  Hospice eligibility approved.   Writer spoke with patient; [name], wife; [name], son at bedside to initiate education related to hospice philosophy, services and team approach to care.  Patient/family verbalized understanding of information given.  Per discussion, plan is for discharge to home by PTAR on 08/29/17.  Please send signed and completed DNR form home with patient/family.  Patient will need prescriptions for discharge comfort medications.   DME needs have been discussed, patient currently has the following equipment in the home:  Walker, cane, wheelchair and transport chair.  Patient/family requests the following DME for delivery to the home:  O2 concentrator, hospital bed 1/2 rails, OBT, suction and 3 in 1.  [facility] equipment manager has been notified and will contact [place] to arrange delivery to the home.  Home address has been verified and is correct in the chart.  [name] at [phone], is the family member to contact to arrange time of delivery.  [facility] aware of the above.  Completed discharge summary will need to be faxed to [facility] at [phone], when final.  Please notify [facility] when patient is ready to leave the unit at discharge.  (Call [phone] or [phone] after 5pm).  [facility] information and contact numbers given to [name], wife, during this visit.  Above information shared with Steffanie Dunn, [facility].  Please call hospice with any hospice related questions.   Thank you for this referral.  Edyth Gunnels, RN, [place] Hospital Liaison [phone]  All hospital liaisons are now on [place].

## 2017-08-01 NOTE — Care Management Important Message (Signed)
Important Message  Patient Details  Name: Gabriel Stewart MRN: [id number] Date of Birth: 1922-05-07   Medicare Important Message Given:  Yes    Nathen May 08/01/2017, 12:02 PM

## 2017-08-01 NOTE — Progress Notes (Signed)
Nutrition Consult/Follow Up  Chart reviewed. Pt now transitioning to comfort care.  Going home with [place]. Please re-consult as needed.   Arthur Holms, RD, LDN Pager #: [phone] After-Hours Pager #: [phone]

## 2017-08-01 NOTE — Progress Notes (Signed)
08/01/2017 9:42 AM  Gabriel Stewart [id number]      Temp:  [97.2 F (36.2 C)-98.4 F (36.9 C)] 97.7 F (36.5 C) (01/10 0829) Pulse Rate:  [80-105] 105 (01/10 0829) Resp:  [16-24] 18 (01/10 0829) BP: (140-193)/(65-95) 164/83 (01/10 0829) SpO2:  [95 %-100 %] 97 % (01/10 0829) Weight:  [72.6 kg (160 lb)] 72.6 kg (160 lb) (01/10 0358),     Intake/Output Summary (Last 24 hours) at 08/01/2017 0942 Last data filed at 08/01/2017 0804 Gross per 24 hour  Intake 1672.5 ml  Output 890 ml  Net 782.5 ml    Results for orders placed or performed during the hospital encounter of 08/17/2017 (from the past 24 hour(s))  Glucose, capillary     Status: Abnormal   Collection Time: 07/31/17  4:07 PM  Result Value Ref Range   Glucose-Capillary 137 (H) 65 - 99 mg/dL   Comment 1 Notify RN   Glucose, capillary     Status: Abnormal   Collection Time: 08/01/17 12:03 AM  Result Value Ref Range   Glucose-Capillary 140 (H) 65 - 99 mg/dL  Glucose, capillary     Status: Abnormal   Collection Time: 08/01/17  3:58 AM  Result Value Ref Range   Glucose-Capillary 152 (H) 65 - 99 mg/dL  Glucose, capillary     Status: Abnormal   Collection Time: 08/01/17  8:22 AM  Result Value Ref Range   Glucose-Capillary 150 (H) 65 - 99 mg/dL   Comment 1 Notify RN    Comment 2 Document in Chart     Family conference with son, wife, Wadie Lessen NP and myself.  Talked about diagnosis, prognosis, treatment options including no treatment.  I definitely feel Hospice would be valuable at this point for all involved.  I will pursue evaluation and treatment only if specifically requested.  Tracheostomy for airway may be in the offing, but will complicate his life substantially and perhaps not add quality of life.  Questions were answered .  Jodi Marble

## 2017-08-01 NOTE — Progress Notes (Signed)
Patient ID: Gabriel Stewart, male   DOB: [birth year], 84 y.o.   MRN: [id number]  This NP visited patient at the bedside as a follow up to  yesterday's [place].  Continued conversation regarding diagnosis, prognosis, goals of care and end-of-life wishes within the context of end-stage Parkinson's disease and newly found likely malignant mass on his larynx.  The difference between an aggressive medical intervention path and a palliative comfort path for this patient at this time in this situation was detailed. Values and goals of care important to the patient and family were attempted to be elicited.  Questions and concerns addressed  Patient's wife with the support of the patient and family made decision to shift to a full comfort path.    - DNR/DNI/no BiPAP    - Avoid hospitalizations     -No further diagnostics or lab draws, no antibiotics    -  No artificial feeding or hydration now or in the future. (Continue to use PEG tube for medications and used to administer Ensure followed by free water at patient request.)    -  Begin to de-escalate medications however continue medications for Parkinson's disease symptoms    -Symptom management-Roxanol and Ativan    - Discharge home with hospice, discussed with case management  Discussed with patient's family limited prognosis; anything could happen at any time but prognosis is likely less than a few weeks.  Discussed with Dr. Horris Latino   Time in 1030          Time out   1130   Total time spent on the unit was 60 minutes  Greater than 50% of the time was spent in counseling and coordination of care  Wadie Lessen NP  Palliative Medicine Team Team Phone # [phone] Pager [phone]

## 2017-08-01 NOTE — Care Management Note (Signed)
Case Management Note Marvetta Gibbons RN, BSN Unit 4E-Case Manager [phone]  Patient Details  Name: Gabriel Stewart MRN: [id number] Date of Birth: Jan 09, 1922  Subjective/Objective:     Pt admitted with acute resp. Failure- HF and pna               Action/Plan: PTA pt was at [facility]- per PT/OT evals- recommendations for return to SNF- CSW to follow for placement needs- anticipate return to [facility] when medically stable.  Expected Discharge Date:                  Expected Discharge Plan:  [place]  In-House Referral:  Clinical Social Work  Discharge planning Services  CM Consult  Post Acute Care Choice:  Durable Medical Equipment, Hospice Choice offered to:  Spouse, Adult Children  DME Arranged:  Systems developer Others DME Agency:  [place] Arranged:  RN, Disease Management [place] Agency:  Hospice and [place]  Status of Service:  Completed, signed off  If discussed at [employer] of Stay Meetings, dates discussed:    Discharge Disposition: home with home hospice  Additional Comments:  08/01/17- 1130- Marvetta Gibbons RN, CM- received referral for Home Hospice choice- PC has met with pt's wife and son and they have chosen to return pt home with home hospice- spoke with pt's son and wife at bedside- choice offered for Home Hospice provider- [facility] confirmed has Hospice choice- discussed possible DME needs- that include - hospital bed, home 02 and suction set up- pt will need PTAR transportation for transport home. Referral called to Edyth Gunnels with [facility]- she will come f/u with family in the room around 12 noon today- plan for d/c possibly on 2017/08/08- CM will continue to follow for transition needs   Dawayne Patricia, RN 08/01/2017, 11:44 AM

## 2017-08-01 NOTE — Progress Notes (Signed)
Gastric residual 5 ml, returned

## 2017-08-02 LAB — CULTURE, BLOOD (ROUTINE X 2)
CULTURE: NO GROWTH
Culture: NO GROWTH
SPECIAL REQUESTS: ADEQUATE
Special Requests: ADEQUATE

## 2017-08-02 MED ORDER — WHITE PETROLATUM EX OINT
TOPICAL_OINTMENT | CUTANEOUS | Status: AC
  Administered 2017-08-02: 08:00:00
  Filled 2017-08-02: qty 28.35

## 2017-08-02 MED ORDER — LORAZEPAM 2 MG/ML IJ SOLN: 1.0000 mg | INTRAMUSCULAR | Status: DC | PRN

## 2017-08-02 MED ORDER — MORPHINE SULFATE (PF) 2 MG/ML IV SOLN
2.0000 mg | INTRAVENOUS | Status: DC | PRN
  Filled 2017-08-02: qty 1

## 2017-08-02 MED ORDER — GLYCOPYRROLATE 0.2 MG/ML IJ SOLN
0.2000 mg | INTRAMUSCULAR | Status: DC | PRN
  Filled 2017-08-02: qty 1

## 2017-08-23 NOTE — Progress Notes (Signed)
Pt wife and son left with all of belongings, including clothes and glasses. Pt and death certificate transported to the morgue. Bed placement notified.   Clyde Canterbury, RN

## 2017-08-23 NOTE — Care Management Note (Signed)
Case Management Note Marvetta Gibbons RN, BSN Unit 4E-Case Manager [phone]  Patient Details  Name: Gabriel Stewart MRN: [id number] Date of Birth: 07-Jun-1922  Subjective/Objective:     Pt admitted with acute resp. Failure- HF and pna               Action/Plan: PTA pt was at [facility]- per PT/OT evals- recommendations for return to SNF- CSW to follow for placement needs- anticipate return to [facility] when medically stable.  Expected Discharge Date:                  Expected Discharge Plan:  Skilled Nursing Facility  In-House Referral:  Clinical Social Work  Discharge planning Services  CM Consult  Post Acute Care Choice:  Durable Medical Equipment, Hospice Choice offered to:  Spouse, Adult Children  DME Arranged:  Systems developer Others DME Agency:  [place] Arranged:  RN, Disease Management [place] Agency:  Hospice and [place]  Status of Service:  Completed, signed off  If discussed at [employer] of Stay Meetings, dates discussed:    Discharge Disposition: expired  Additional Comments:  03-Aug-2017- 1045- Donnae Michels RN, CM- pt had a sharp decline over night and this morning and expired this AM with wife and son at the bedside- have notified Amy Evans with [facility] that pt has expired - Amy will work with family to have DME that was delivered to home picked up as soon as possible per wife's request.   08/01/17- 33- Marvetta Gibbons RN, CM- received referral for Home Hospice choice- PC has met with pt's wife and son and they have chosen to return pt home with home hospice- spoke with pt's son and wife at bedside- choice offered for Home Hospice provider- [facility] confirmed has Hospice choice- discussed possible DME needs- that include - hospital bed, home 02 and suction set up- pt will need PTAR transportation for transport home. Referral called to Edyth Gunnels with [facility]- she will come f/u with family in the room around 12  noon today- plan for d/c possibly on Aug 03, 2017- CM will continue to follow for transition needs.    Dawayne Patricia, RN 08/03/2017, 10:46 AM

## 2017-08-23 NOTE — Progress Notes (Signed)
Palliative Medicine RN Note: Rec'd call from RN Jarrett Soho around 9:20 with concerns that he is now actively dying and not stable for transport home w hospice as planned.  I arrived around 9:45. Son and wife [name] at bedside. Patient is actively dying. He is having stridor and labored breathing. He is completely non-responsive. Feet were cool with warm hands initially, but by 1000, hands were also cool. Son is very concerned about transition to IV morphine; he is afraid we are attempting to prolong life and is adamant that we not prolong life artificially. Pt's wife verbalized understanding, but his son remained concerned that his death would be drawn out by giving morphine. We discussed my concern that Mr Reuss will suffer if the mass in his throat completely blocks his airway; morphine via PEG as previously ordered will not work quick enough to get him comfortable at that time. He agreed with the plan for IV morphine. I left to call one of my attendings for recommendations in case of obstruction.   I was called back to the room at 1008 by the RN; patient appears to have died. I confirmed absence of heartbeat at 1010. Dr Rowe Pavy also arrived to see the patient and confirmed death.   Family is grateful for help from staff. We discussed next steps; they do not want to spend time at bedside. They are requesting a list of crematories; SW will get one and bring to the room. Mrs [name] also asked about getting forms completed to be able to cancel her cruise (which is in 3 days); provided my address, fax, and email. I will get the forms completed and returned to her asap.  Marjie Skiff Elo Marmolejos, RN, BSN, [facility] Palliative Medicine Team 2017/08/23 11:08 AM Office [phone]

## 2017-08-23 NOTE — Progress Notes (Signed)
PMT no charge note  Patient seen, along with my palliative colleague Larina Bras, RN, patient's wife and son in the room, patient actively dying with apnea spells, respiratory stridor and other end of life signs and symptoms.   We discussed with family about the patient's imminent demise and subsequently, the patient passed away peacefully, with his family by his side.   The patient's wife has asked for recommendations for crematoriums, care management to follow up with family momentarily.   Offered supportive care and active listening. Brief life review also performed, the patient is described as a strong patient man who never once complained about his decline since his Parkinson's disease began.   Patient had been married for 60+ years, one son is deceased, one son is at bedside, one daughter is on her way from [place], expected to arrive in [place], [place] this evening.   Patient had a good day yesterday, he interacted with his family a lot and participated in his own goals of care discussions and decision making well.   Hospice has already delivered medical equipment to his house last night, we will contact hospice agency to remove the medical equipment from the patient's home, as the plan was for him to go home with hospice.   Condolences offered and appropriate paper work to be duly completed.   Loistine Chance MD [facility] health palliative medicine team [phone]

## 2017-08-23 NOTE — Death Summary Note (Signed)
Death Summary  Gabriel Stewart MLY:[id number] DOB: 1922-05-13 DOA: 2017/08/22  PCP: Leighton Ruff, MD  Admit date: 08/22/2017 Date of Death: 08-27-2017 Time of Death: 10:10 am Notification: Leighton Ruff, MD notified of death of 08/27/17   History of present illness:  Gabriel Stewart is a 84 y.o. male with a history of end stage parkinsons since 2008[akinatic dysautonomia + dysphagia [has peg tube] AOCD, COPD, HTN. Gabriel Stewart presented with complaint of sob, wheezing, hoarse voice, dysphagia. 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 ENT was consulted. Gabriel Stewart did not improve after pt was made NPO, treated briefly with IV AB and managed with breathing treatments and supplemental oxygen. Palliative team consulted. Pt and family was in agreement to be made hospice with comfort care on August 26, 2017. Pt passed away on 08/27/17    Final Diagnoses:  1.   Acute hypoxic respiratory failure likely due to mass in R transglottic larynx likely to be SSC COPD End stage parkinson    The results of significant diagnostics from this hospitalization (including imaging, microbiology, ancillary and laboratory) are listed below for reference.    Significant Diagnostic Studies: Dg Chest 2 View  Result Date: 07/29/2017 CLINICAL DATA:  Cough EXAM: CHEST  2 VIEW COMPARISON:  07/29/2017 FINDINGS: Cardiac shadow is stable. Aortic calcifications are again seen. Lungs are well aerated bilaterally. Minimal atelectatic changes are now seen in the left base. No other focal abnormality is noted. IMPRESSION: Minimal left basilar atelectasis. Electronically Signed   By: Inez Catalina M.D.   On: 07/29/2017 14:32   Dg Chest 2 View  Result Date: 07/19/2017 CLINICAL DATA:  Shortness of breath EXAM: CHEST  2 VIEW COMPARISON:  11/20/2016 FINDINGS: No focal pulmonary infiltrate or pleural effusion. Normal cardiomediastinal silhouette with aortic  atherosclerosis. No pneumothorax. Mild scarring at the left lung base. Degenerative changes of the spine. IMPRESSION: No active cardiopulmonary disease. Electronically Signed   By: Donavan Foil M.D.   On: 07/19/2017 20:51   Dg Abdomen Peg Tube Location  Result Date: 07/21/2017 CLINICAL DATA:  Percutaneous gastrostomy tube placement. EXAM: ABDOMEN - 1 VIEW COMPARISON:  None. FINDINGS: A small amount of contrast injected through the gastrostomy tube is seen within the stomach and proximal duodenum. Oral contrast material also seen within the colon, likely from recent modified barium swallow. No evidence of dilated bowel loops. IMPRESSION: Percutaneous gastrostomy tube within stomach. Unremarkable bowel gas pattern. Electronically Signed   By: Earle Gell M.D.   On: 07/21/2017 20:06   Dg Chest Port 1 View  Result Date: 07/29/2017 CLINICAL DATA:  Respiratory failure EXAM: PORTABLE CHEST 1 VIEW COMPARISON:  08-22-17 FINDINGS: Cardiac shadows within normal limits. Aortic calcifications are noted. The lungs are well aerated bilaterally. Mild interstitial changes are again noted. No focal infiltrate or sizable effusion is seen. Degenerative changes of thoracic spine are noted. IMPRESSION: No acute abnormality noted. Electronically Signed   By: Inez Catalina M.D.   On: 07/29/2017 08:30   Dg Chest Port 1 View  Result Date: 2017-08-22 CLINICAL DATA:  Shortness of breath, history hypertension, Parkinson's, melanoma, former smoker EXAM: PORTABLE CHEST 1 VIEW COMPARISON:  Portable exam 1048 hours compared to 07/23/2017 FINDINGS: Normal heart size, mediastinal contours, and pulmonary vascularity. Atherosclerotic calcifications aorta. Emphysematous and minimal bronchitic changes question COPD. Minimal chronic accentuation of LEFT basilar markings. No acute infiltrate, pleural effusion or pneumothorax. Diffuse osseous demineralization. IMPRESSION: Question COPD changes without acute infiltrate. Electronically Signed  By: Lavonia Dana M.D.   On: 08/16/2017 11:13   Dg Chest Port 1 View  Result Date: 07/23/2017 CLINICAL DATA:  Congestion and difficulty breathing EXAM: PORTABLE CHEST 1 VIEW COMPARISON:  07/19/2017 FINDINGS: The heart size and mediastinal contours are within normal limits. Both lungs are clear. The visualized skeletal structures are unremarkable. IMPRESSION: No active disease. Electronically Signed   By: Inez Catalina M.D.   On: 07/23/2017 08:07   Dg Swallowing Func-speech Pathology  Result Date: 07/20/2017 Objective Swallowing Evaluation: Type of Study: MBS-Modified Barium Swallow Study  Patient Details Name: TORIANO AIKEY MRN: [id number] Date of Birth: [birth year] Today's Date: 07/20/2017 Time: SLP Start Time (ACUTE ONLY): 31 -SLP Stop Time (ACUTE ONLY): 1250 SLP Time Calculation (min) (ACUTE ONLY): 20 min Past Medical History: Past Medical History: Diagnosis Date . Cardiac arrest [facility]) 1970's  "did CPR & revived him"  . Dizzy spells   "frequent" . Hypertension  . Kidney stone  . Lyme disease [phone] . Melanoma ([place]) 2010  "back" . Parkinson's disease ([place])  . Pneumonia   "as a kid" . Syncope and collapse 11/24/2014 Past Surgical History: Past Surgical History: Procedure Laterality Date . BLADDER STONE REMOVAL  2015 . CATARACT EXTRACTION W/ INTRAOCULAR LENS  IMPLANT, BILATERAL Bilateral 1970 . CIRCUMCISION  2015 . CYSTOSCOPY W/ STONE MANIPULATION    "never got the stone though" . MELANOMA EXCISION  2010  "back" . TEMPORAL ARTERY BIOPSY / LIGATION  [id number]'O  "before dx'd w/Lyme's disease" HPI: Patient is a 85 y.o. male with PMH: Parkinson's disease, difficulty swallowing, s/p feeding tube, cardiac arrest, syncope, right bundle blockage, melanoma, who presents to hospital with cough and SOB.  Subjective: pleasant, able to answer questions but with very low vocal intensity Assessment / Plan / Recommendation CHL IP CLINICAL IMPRESSIONS 07/20/2017 Clinical Impression Patient presents with a mod-severe  oropharyngeal dysphagia characterized by swallow initiation delays to vallecular sinus for puree, regular and nectar consistencies, and delays to pyriform sinus with thin liquids. Patient with moderate vallecular residuals post initial swallow with puree solids and regular solids, which started to clear (not fully) with cued swallows and use of throat clear and reswallow, as well as chin tuck and reswallow strategies. Patient exhibited penetration and aspiration during and after swallow with thin liquids and did not sense until after penetration or aspiration had already occured. Chin tuck strategy did not help in reducing incidence of penetration or aspiration, but did help with clearance of vallecular sinus and pyriform sinus residuals.  SLP Visit Diagnosis Dysphagia, oropharyngeal phase (R13.12) Attention and concentration deficit following -- Frontal lobe and executive function deficit following -- Impact on safety and function Moderate aspiration risk   CHL IP TREATMENT RECOMMENDATION 07/20/2017 Treatment Recommendations Therapy as outlined in treatment plan below   Prognosis 07/20/2017 Prognosis for Safe Diet Advancement Fair Barriers to Reach Goals Severity of deficits Barriers/Prognosis Comment -- CHL IP DIET RECOMMENDATION 07/20/2017 SLP Diet Recommendations Dysphagia 2 (Fine chop) solids;Nectar thick liquid Liquid Administration via Cup;No straw Medication Administration Crushed with puree Compensations Minimize environmental distractions;Slow rate;Small sips/bites;Multiple dry swallows after each bite/sip;Clear throat intermittently;Chin tuck Postural Changes Remain semi-upright after after feeds/meals (Comment);Seated upright at 90 degrees   CHL IP OTHER RECOMMENDATIONS 07/20/2017 Recommended Consults -- Oral Care Recommendations Oral care QID Other Recommendations Have oral suction available;Order thickener from pharmacy;Prohibited food (jello, ice cream, thin soups);Remove water pitcher   CHL IP FOLLOW  UP RECOMMENDATIONS 07/20/2017 Follow up Recommendations Skilled Nursing facility;Home health SLP;24 hour supervision/assistance   CHL  IP FREQUENCY AND DURATION 07/20/2017 Speech Therapy Frequency (ACUTE ONLY) min 2x/week Treatment Duration 1 week      CHL IP ORAL PHASE 07/20/2017 Oral Phase Impaired Oral - Pudding Teaspoon -- Oral - Pudding Cup -- Oral - Honey Teaspoon -- Oral - Honey Cup -- Oral - Nectar Teaspoon -- Oral - Nectar Cup Weak lingual manipulation Oral - Nectar Straw -- Oral - Thin Teaspoon -- Oral - Thin Cup Premature spillage Oral - Thin Straw -- Oral - Puree Weak lingual manipulation Oral - Mech Soft Impaired mastication;Weak lingual manipulation;Piecemeal swallowing;Other (Comment) Oral - Regular -- Oral - Multi-Consistency -- Oral - Pill -- Oral Phase - Comment --  CHL IP PHARYNGEAL PHASE 07/20/2017 Pharyngeal Phase Impaired Pharyngeal- Pudding Teaspoon -- Pharyngeal -- Pharyngeal- Pudding Cup -- Pharyngeal -- Pharyngeal- Honey Teaspoon -- Pharyngeal -- Pharyngeal- Honey Cup -- Pharyngeal -- Pharyngeal- Nectar Teaspoon -- Pharyngeal -- Pharyngeal- Nectar Cup Delayed swallow initiation-vallecula;Pharyngeal residue - valleculae;Compensatory strategies attempted (with notebox) Pharyngeal -- Pharyngeal- Nectar Straw -- Pharyngeal -- Pharyngeal- Thin Teaspoon Delayed swallow initiation-vallecula;Delayed swallow initiation-pyriform sinuses;Penetration/Aspiration during swallow Pharyngeal Material enters airway, remains ABOVE vocal cords then ejected out Pharyngeal- Thin Cup Delayed swallow initiation-pyriform sinuses;Delayed swallow initiation-vallecula;Penetration/Aspiration during swallow;Reduced airway/laryngeal closure;Compensatory strategies attempted (with notebox) Pharyngeal Material enters airway, passes BELOW cords and not ejected out despite cough attempt by patient;Material enters airway, passes BELOW cords without attempt by patient to eject out (silent aspiration) Pharyngeal- Thin Straw --  Pharyngeal -- Pharyngeal- Puree Pharyngeal residue - valleculae;Compensatory strategies attempted (with notebox) Pharyngeal -- Pharyngeal- Mechanical Soft Delayed swallow initiation-vallecula;Pharyngeal residue - valleculae Pharyngeal -- Pharyngeal- Regular -- Pharyngeal -- Pharyngeal- Multi-consistency -- Pharyngeal -- Pharyngeal- Pill -- Pharyngeal -- Pharyngeal Comment --  No flowsheet data found. CHL IP GO 05/27/2016 Functional Assessment Tool Used clinical judgment Functional Limitations Swallowing Swallow Current Status [phone]) CI Swallow Goal Status (X9147) CI Swallow Discharge Status (W2956) CI Motor Speech Current Status (O1308) (None) Motor Speech Goal Status (M5784) (None) Motor Speech Goal Status (O9629) (None) Spoken Language Comprehension Current Status (B2841) (None) Spoken Language Comprehension Goal Status (L2440) (None) Spoken Language Comprehension Discharge Status [phone]) (None) Spoken Language Expression Current Status [phone]) (None) Spoken Language Expression Goal Status [phone]) (None) Spoken Language Expression Discharge Status [phone]) (None) Attention Current Status (G3875) (None) Attention Goal Status (I4332) (None) Attention Discharge Status [phone]) (None) Memory Current Status (C1660) (None) Memory Goal Status (Y3016) (None) Memory Discharge Status (W1093) (None) Voice Current Status (A3557) (None) Voice Goal Status (D2202) (None) Voice Discharge Status (R4270) (None) Other Speech-Language Pathology Functional Limitation Current Status [phone] (None) Other Speech-Language Pathology Functional Limitation Goal Status (E8315) (None) Other Speech-Language Pathology Functional Limitation Discharge Status [phone]) (None) Sonia Baller, MA, CCC-SLP 07/20/17 3:00 PM               Microbiology: Recent Results (from the past 240 hour(s))  Culture, blood (routine x 2)     Status: None   Collection Time: 08/19/2017 11:25 AM  Result Value Ref Range Status   Specimen Description BLOOD LEFT  ANTECUBITAL  Final   Special Requests   Final    BOTTLES DRAWN AEROBIC AND ANAEROBIC Blood Culture adequate volume   Culture NO GROWTH 5 DAYS  Final   Report Status 08-26-17 FINAL  Final  Culture, blood (routine x 2)     Status: None   Collection Time: 08/04/2017 11:55 AM  Result Value Ref Range Status   Specimen Description BLOOD RIGHT ANTECUBITAL  Final   Special Requests IN PEDIATRIC BOTTLE Blood  Culture adequate volume  Final   Culture NO GROWTH 5 DAYS  Final   Report Status 2017/08/06 FINAL  Final  Respiratory Panel by PCR     Status: None   Collection Time: 07/30/17  1:34 AM  Result Value Ref Range Status   Adenovirus NOT DETECTED NOT DETECTED Final   Coronavirus 229E NOT DETECTED NOT DETECTED Final   Coronavirus HKU1 NOT DETECTED NOT DETECTED Final   Coronavirus NL63 NOT DETECTED NOT DETECTED Final   Coronavirus OC43 NOT DETECTED NOT DETECTED Final   Metapneumovirus NOT DETECTED NOT DETECTED Final   Rhinovirus / Enterovirus NOT DETECTED NOT DETECTED Final   Influenza A NOT DETECTED NOT DETECTED Final   Influenza B NOT DETECTED NOT DETECTED Final   Parainfluenza Virus 1 NOT DETECTED NOT DETECTED Final   Parainfluenza Virus 2 NOT DETECTED NOT DETECTED Final   Parainfluenza Virus 3 NOT DETECTED NOT DETECTED Final   Parainfluenza Virus 4 NOT DETECTED NOT DETECTED Final   Respiratory Syncytial Virus NOT DETECTED NOT DETECTED Final   Bordetella pertussis NOT DETECTED NOT DETECTED Final   Chlamydophila pneumoniae NOT DETECTED NOT DETECTED Final   Mycoplasma pneumoniae NOT DETECTED NOT DETECTED Final     Labs: Basic Metabolic Panel: Recent Labs  Lab 08/14/2017 1148 07/29/17 0318 07/30/17 0401 07/31/17 0207  NA 135 137 136 135  K 3.9 4.4 4.0 3.7  CL 98* 107 105 102  CO2 27 24 25 27   GLUCOSE 176* 177* 134* 93  BUN 20 17 19 17   CREATININE 0.87 0.78 0.72 0.70  CALCIUM 8.7* 8.0* 8.3* 8.2*   Liver Function Tests: Recent Labs  Lab 07/29/17 0318 07/30/17 0401  07/31/17 0207  AST 28 21 28   ALT 7* 6* <5*  ALKPHOS 59 63 61  BILITOT 0.9 1.0 1.0  PROT 5.0* 5.0* 5.0*  ALBUMIN 2.4* 2.5* 2.4*   No results for input(s): LIPASE, AMYLASE in the last 168 hours. No results for input(s): AMMONIA in the last 168 hours. CBC: Recent Labs  Lab 08/06/2017 1148 07/29/17 0318 07/31/17 0207  WBC 14.3* 10.3 10.9*  NEUTROABS 12.7* 9.8 9.3*  HGB 9.7* 9.9* 9.5*  HCT 30.6* 31.2* 30.3*  MCV 94.2 94.8 92.9  PLT 323 236 209   Cardiac Enzymes: No results for input(s): CKTOTAL, CKMB, CKMBINDEX, TROPONINI in the last 168 hours. D-Dimer No results for input(s): DDIMER in the last 72 hours. BNP: Invalid input(s): POCBNP CBG: Recent Labs  Lab 07/31/17 1607 08/01/17 0003 08/01/17 0358 08/01/17 0822 08/01/17 1138  GLUCAP 137* 140* 152* 150* 141*   Anemia work up No results for input(s): VITAMINB12, FOLATE, FERRITIN, TIBC, IRON, RETICCTPCT in the last 72 hours. Urinalysis    Component Value Date/Time   COLORURINE YELLOW 07/30/2017 0331   APPEARANCEUR HAZY (A) 07/30/2017 0331   LABSPEC 1.019 07/30/2017 0331   PHURINE 5.0 07/30/2017 0331   GLUCOSEU NEGATIVE 07/30/2017 0331   HGBUR NEGATIVE 07/30/2017 0331   BILIRUBINUR NEGATIVE 07/30/2017 0331   KETONESUR 5 (A) 07/30/2017 0331   PROTEINUR 30 (A) 07/30/2017 0331   NITRITE NEGATIVE 07/30/2017 0331   LEUKOCYTESUR NEGATIVE 07/30/2017 0331   Sepsis Labs Invalid input(s): PROCALCITONIN,  WBC,  LACTICIDVEN     SIGNED:  Alma Friendly, MD  [facility] 06-Aug-2017, 7:08 PM Pager   If 7PM-7AM, please contact night-coverage [email or web address] Password [email or web address]

## 2017-08-23 NOTE — Progress Notes (Addendum)
[facility] Liaison:   RN  Spoke with Steffanie Dunn, [facility] regarding patient status.  I have update [facility] referral center.    Thank you,  Edyth Gunnels, RN, [facility] Liaison [phone]

## 2017-08-23 NOTE — Progress Notes (Signed)
0500: pt is not responsive to pain, labored breathing. RN notified pt's son of current status and advised family to come in. Will continue to follow.

## 2017-08-23 DEATH — deceased

## 2018-04-30 IMAGING — DX DG CHEST 1V PORT
1 series · 1 of 1 positions shown · non-contrast
Comparison: 07/28/2017

CLINICAL DATA: Respiratory failure

EXAM:
PORTABLE CHEST 1 VIEW

[chest ap]
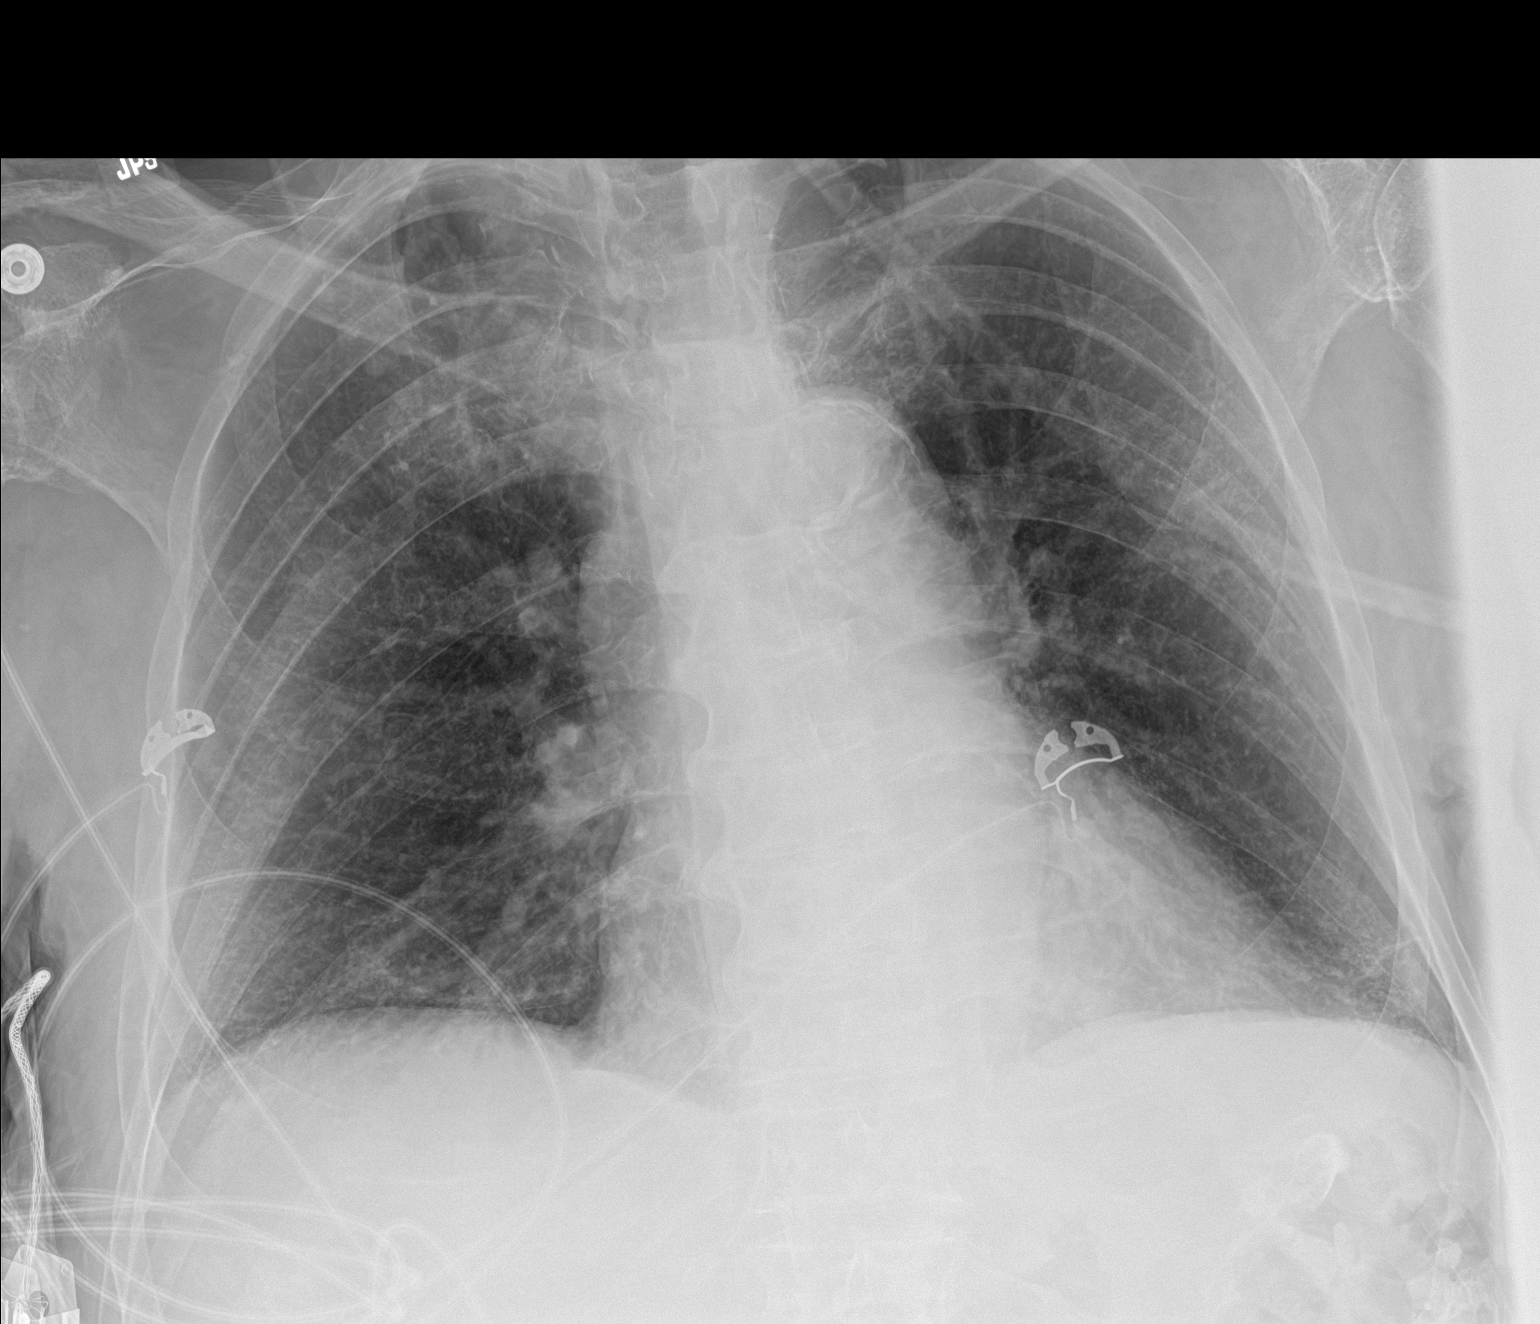

[1 of 1 positions shown; findings below may reference images not displayed]

FINDINGS: Cardiac shadows within normal limits. Aortic calcifications are
noted. The lungs are well aerated bilaterally. Mild interstitial
changes are again noted. No focal infiltrate or sizable effusion is
seen. Degenerative changes of thoracic spine are noted.
IMPRESSION: No acute abnormality noted.

## 2018-06-09 ENCOUNTER — Ambulatory Visit: Payer: Medicare PPO | Admitting: Diagnostic Neuroimaging
